# Patient Record
Sex: Male | Born: 1975 | Race: White | Hispanic: No | Marital: Married | Smoking: Current every day smoker
Health system: Southern US, Community
[De-identification: ages and names within clinical notes are randomized; demographics above are authoritative.]

## PROBLEM LIST (undated history)

## (undated) DIAGNOSIS — G4730 Sleep apnea, unspecified: Secondary | ICD-10-CM

## (undated) DIAGNOSIS — I10 Essential (primary) hypertension: Secondary | ICD-10-CM

## (undated) DIAGNOSIS — F419 Anxiety disorder, unspecified: Secondary | ICD-10-CM

## (undated) DIAGNOSIS — G8929 Other chronic pain: Secondary | ICD-10-CM

## (undated) DIAGNOSIS — E785 Hyperlipidemia, unspecified: Secondary | ICD-10-CM

## (undated) DIAGNOSIS — F319 Bipolar disorder, unspecified: Secondary | ICD-10-CM

## (undated) DIAGNOSIS — J449 Chronic obstructive pulmonary disease, unspecified: Secondary | ICD-10-CM

## (undated) DIAGNOSIS — Z86718 Personal history of other venous thrombosis and embolism: Secondary | ICD-10-CM

## (undated) DIAGNOSIS — E119 Type 2 diabetes mellitus without complications: Secondary | ICD-10-CM

## (undated) DIAGNOSIS — Z8614 Personal history of Methicillin resistant Staphylococcus aureus infection: Secondary | ICD-10-CM

## (undated) DIAGNOSIS — F32A Depression, unspecified: Secondary | ICD-10-CM

## (undated) DIAGNOSIS — J9819 Other pulmonary collapse: Secondary | ICD-10-CM

## (undated) DIAGNOSIS — M1990 Unspecified osteoarthritis, unspecified site: Secondary | ICD-10-CM

## (undated) DIAGNOSIS — F329 Major depressive disorder, single episode, unspecified: Secondary | ICD-10-CM

## (undated) DIAGNOSIS — M549 Dorsalgia, unspecified: Secondary | ICD-10-CM

## (undated) HISTORY — DX: Other chronic pain: G89.29

## (undated) HISTORY — PX: VASECTOMY: SHX75

## (undated) HISTORY — DX: Hyperlipidemia, unspecified: E78.5

## (undated) HISTORY — PX: WISDOM TOOTH EXTRACTION: SHX21

## (undated) HISTORY — DX: Anxiety disorder, unspecified: F41.9

## (undated) HISTORY — DX: Dorsalgia, unspecified: M54.9

## (undated) HISTORY — PX: APPENDECTOMY: SHX54

## (undated) HISTORY — PX: DEBRIDEMENT  FOOT: SUR387

## (undated) HISTORY — DX: Major depressive disorder, single episode, unspecified: F32.9

## (undated) HISTORY — DX: Sleep apnea, unspecified: G47.30

## (undated) HISTORY — DX: Bipolar disorder, unspecified: F31.9

## (undated) HISTORY — DX: Depression, unspecified: F32.A

---

## 2001-01-15 ENCOUNTER — Ambulatory Visit (HOSPITAL_COMMUNITY): Admission: RE | Admit: 2001-01-15 | Discharge: 2001-01-15 | Payer: Self-pay | Admitting: Specialist

## 2001-01-15 ENCOUNTER — Encounter: Payer: Self-pay | Admitting: Physical Medicine and Rehabilitation

## 2001-01-15 ENCOUNTER — Encounter: Payer: Self-pay | Admitting: Specialist

## 2001-03-24 ENCOUNTER — Encounter: Payer: Self-pay | Admitting: Specialist

## 2001-03-24 ENCOUNTER — Ambulatory Visit (HOSPITAL_COMMUNITY): Admission: RE | Admit: 2001-03-24 | Discharge: 2001-03-24 | Payer: Self-pay | Admitting: Specialist

## 2002-02-06 ENCOUNTER — Emergency Department (HOSPITAL_COMMUNITY): Admission: EM | Admit: 2002-02-06 | Discharge: 2002-02-06 | Payer: Self-pay | Admitting: Emergency Medicine

## 2002-02-06 ENCOUNTER — Encounter: Payer: Self-pay | Admitting: Emergency Medicine

## 2002-03-03 ENCOUNTER — Ambulatory Visit (HOSPITAL_COMMUNITY): Admission: RE | Admit: 2002-03-03 | Discharge: 2002-03-03 | Payer: Self-pay | Admitting: Family Medicine

## 2002-03-03 ENCOUNTER — Encounter: Payer: Self-pay | Admitting: Family Medicine

## 2002-04-07 ENCOUNTER — Encounter: Payer: Self-pay | Admitting: Emergency Medicine

## 2002-04-07 ENCOUNTER — Inpatient Hospital Stay (HOSPITAL_COMMUNITY): Admission: EM | Admit: 2002-04-07 | Discharge: 2002-04-15 | Payer: Self-pay | Admitting: Emergency Medicine

## 2002-04-08 ENCOUNTER — Encounter: Payer: Self-pay | Admitting: Orthopaedic Surgery

## 2002-04-21 ENCOUNTER — Encounter (HOSPITAL_COMMUNITY): Admission: RE | Admit: 2002-04-21 | Discharge: 2002-05-21 | Payer: Self-pay | Admitting: Orthopaedic Surgery

## 2002-05-27 ENCOUNTER — Encounter (HOSPITAL_COMMUNITY): Admission: RE | Admit: 2002-05-27 | Discharge: 2002-06-26 | Payer: Self-pay | Admitting: Orthopaedic Surgery

## 2002-07-23 ENCOUNTER — Encounter (HOSPITAL_COMMUNITY): Admission: RE | Admit: 2002-07-23 | Discharge: 2002-08-07 | Payer: Self-pay | Admitting: Orthopaedic Surgery

## 2002-08-13 ENCOUNTER — Inpatient Hospital Stay (HOSPITAL_COMMUNITY): Admission: EM | Admit: 2002-08-13 | Discharge: 2002-08-18 | Payer: Self-pay | Admitting: Psychiatry

## 2006-08-16 ENCOUNTER — Emergency Department (HOSPITAL_COMMUNITY): Admission: EM | Admit: 2006-08-16 | Discharge: 2006-08-16 | Payer: Self-pay | Admitting: Emergency Medicine

## 2006-08-23 ENCOUNTER — Emergency Department (HOSPITAL_COMMUNITY): Admission: EM | Admit: 2006-08-23 | Discharge: 2006-08-23 | Payer: Self-pay | Admitting: Emergency Medicine

## 2007-03-07 ENCOUNTER — Emergency Department (HOSPITAL_COMMUNITY): Admission: EM | Admit: 2007-03-07 | Discharge: 2007-03-07 | Payer: Self-pay | Admitting: Emergency Medicine

## 2008-03-12 ENCOUNTER — Encounter: Payer: Self-pay | Admitting: Orthopedic Surgery

## 2008-03-15 ENCOUNTER — Emergency Department (HOSPITAL_COMMUNITY): Admission: EM | Admit: 2008-03-15 | Discharge: 2008-03-15 | Payer: Self-pay | Admitting: Emergency Medicine

## 2008-03-18 ENCOUNTER — Ambulatory Visit: Payer: Self-pay | Admitting: Orthopedic Surgery

## 2008-03-18 DIAGNOSIS — S60229A Contusion of unspecified hand, initial encounter: Secondary | ICD-10-CM | POA: Insufficient documentation

## 2008-04-07 ENCOUNTER — Telehealth: Payer: Self-pay | Admitting: Orthopedic Surgery

## 2008-07-19 ENCOUNTER — Encounter: Payer: Self-pay | Admitting: Orthopedic Surgery

## 2008-07-19 ENCOUNTER — Emergency Department (HOSPITAL_COMMUNITY): Admission: EM | Admit: 2008-07-19 | Discharge: 2008-07-19 | Payer: Self-pay | Admitting: Emergency Medicine

## 2008-07-29 ENCOUNTER — Ambulatory Visit: Payer: Self-pay | Admitting: Orthopedic Surgery

## 2008-07-29 DIAGNOSIS — M542 Cervicalgia: Secondary | ICD-10-CM

## 2008-08-10 ENCOUNTER — Emergency Department (HOSPITAL_COMMUNITY): Admission: EM | Admit: 2008-08-10 | Discharge: 2008-08-10 | Payer: Self-pay | Admitting: Emergency Medicine

## 2009-07-28 ENCOUNTER — Emergency Department (HOSPITAL_COMMUNITY): Admission: EM | Admit: 2009-07-28 | Discharge: 2009-07-28 | Payer: Self-pay | Admitting: Emergency Medicine

## 2010-02-07 ENCOUNTER — Emergency Department (HOSPITAL_COMMUNITY): Admission: EM | Admit: 2010-02-07 | Discharge: 2010-02-07 | Payer: Self-pay | Admitting: Emergency Medicine

## 2010-06-26 ENCOUNTER — Emergency Department (HOSPITAL_COMMUNITY): Admission: EM | Admit: 2010-06-26 | Discharge: 2010-06-26 | Payer: Self-pay | Admitting: Emergency Medicine

## 2010-07-02 ENCOUNTER — Emergency Department (HOSPITAL_COMMUNITY): Admission: EM | Admit: 2010-07-02 | Discharge: 2010-07-02 | Payer: Self-pay | Admitting: Emergency Medicine

## 2010-07-03 ENCOUNTER — Encounter: Payer: Self-pay | Admitting: Emergency Medicine

## 2010-07-03 ENCOUNTER — Emergency Department (HOSPITAL_COMMUNITY): Admission: EM | Admit: 2010-07-03 | Discharge: 2010-07-03 | Payer: Self-pay | Admitting: Emergency Medicine

## 2010-07-03 DIAGNOSIS — Z86718 Personal history of other venous thrombosis and embolism: Secondary | ICD-10-CM

## 2010-07-03 HISTORY — DX: Personal history of other venous thrombosis and embolism: Z86.718

## 2010-07-26 ENCOUNTER — Emergency Department (HOSPITAL_COMMUNITY): Admission: EM | Admit: 2010-07-26 | Discharge: 2010-07-26 | Payer: Self-pay | Admitting: Emergency Medicine

## 2010-08-08 ENCOUNTER — Ambulatory Visit (HOSPITAL_COMMUNITY): Admission: RE | Admit: 2010-08-08 | Discharge: 2010-08-08 | Payer: Self-pay | Admitting: Urology

## 2010-08-24 ENCOUNTER — Ambulatory Visit (HOSPITAL_COMMUNITY): Admission: RE | Admit: 2010-08-24 | Discharge: 2010-08-24 | Payer: Self-pay | Admitting: Urology

## 2011-02-15 LAB — BASIC METABOLIC PANEL
CO2: 29 mEq/L (ref 19–32)
Chloride: 104 mEq/L (ref 96–112)
Creatinine, Ser: 0.71 mg/dL (ref 0.4–1.5)
GFR calc Af Amer: >60 mL/min (ref >60)
Potassium: 3.8 mEq/L (ref 3.5–5.1)

## 2011-02-15 LAB — TYPE AND SCREEN: ABO/RH(D): O NEG

## 2011-02-15 LAB — URINALYSIS, ROUTINE W REFLEX MICROSCOPIC
Bilirubin Urine: NEGATIVE
Hgb urine dipstick: NEGATIVE
Protein, ur: NEGATIVE mg/dL
Urobilinogen, UA: 0.2 mg/dL (ref 0.0–1.0)

## 2011-02-15 LAB — CBC
HCT: 42.8 % (ref 39.0–52.0)
Hemoglobin: 14.2 g/dL (ref 13.0–17.0)
MCH: 30 pg (ref 26.0–34.0)
MCHC: 33.2 g/dL (ref 30.0–36.0)
MCV: 90.5 fL (ref 78.0–100.0)
RBC: 4.73 MIL/uL (ref 4.22–5.81)
WBC: 14.1 10*3/uL — ABNORMAL HIGH (ref 4.0–10.5)

## 2011-02-15 LAB — DIFFERENTIAL
Basophils Relative: 1 % (ref 0–1)
Eosinophils Relative: 4 % (ref 0–5)
Lymphocytes Relative: 25 % (ref 12–46)
Neutro Abs: 9 10*3/uL — ABNORMAL HIGH (ref 1.7–7.7)
Neutrophils Relative %: 64 % (ref 43–77)

## 2011-02-15 LAB — PROTIME-INR
INR: 4.05 — ABNORMAL HIGH (ref 0.00–1.49)
Prothrombin Time: 39.3 seconds — ABNORMAL HIGH (ref 11.6–15.2)

## 2011-02-16 LAB — BASIC METABOLIC PANEL
CO2: 29 mEq/L (ref 19–32)
Calcium: 8.6 mg/dL (ref 8.4–10.5)
Chloride: 105 mEq/L (ref 96–112)
GFR calc Af Amer: >60 mL/min (ref >60)
Potassium: 3.8 mEq/L (ref 3.5–5.1)
Sodium: 138 mEq/L (ref 135–145)

## 2011-02-16 LAB — CBC
Hemoglobin: 13.2 g/dL (ref 13.0–17.0)
MCH: 31.5 pg (ref 26.0–34.0)
WBC: 10.5 10*3/uL (ref 4.0–10.5)

## 2011-02-16 LAB — DIFFERENTIAL
Basophils Relative: 0 % (ref 0–1)
Eosinophils Absolute: 0.6 10*3/uL (ref 0.0–0.7)
Lymphocytes Relative: 28 % (ref 12–46)
Lymphs Abs: 3 10*3/uL (ref 0.7–4.0)
Monocytes Absolute: 0.8 10*3/uL (ref 0.1–1.0)
Neutro Abs: 6.1 10*3/uL (ref 1.7–7.7)
Neutrophils Relative %: 58 % (ref 43–77)

## 2011-02-16 LAB — PROTIME-INR: Prothrombin Time: 13.3 seconds (ref 11.6–15.2)

## 2011-02-17 LAB — CBC
MCHC: 33.5 g/dL (ref 30.0–36.0)
MCV: 92.4 fL (ref 78.0–100.0)
Platelets: 293 10*3/uL (ref 150–400)
RBC: 4.23 MIL/uL (ref 4.22–5.81)
RDW: 14.6 % (ref 11.5–15.5)

## 2011-02-17 LAB — DIFFERENTIAL
Eosinophils Relative: 6 % — ABNORMAL HIGH (ref 0–5)
Lymphocytes Relative: 27 % (ref 12–46)
Lymphs Abs: 2.9 10*3/uL (ref 0.7–4.0)
Monocytes Absolute: 0.8 10*3/uL (ref 0.1–1.0)
Neutrophils Relative %: 60 % (ref 43–77)

## 2011-02-17 LAB — COMPREHENSIVE METABOLIC PANEL
ALT: 53 U/L (ref 0–53)
AST: 39 U/L — ABNORMAL HIGH (ref 0–37)
Albumin: 3.4 g/dL — ABNORMAL LOW (ref 3.5–5.2)
CO2: 28 mEq/L (ref 19–32)
Chloride: 106 mEq/L (ref 96–112)
Total Protein: 6.5 g/dL (ref 6.0–8.3)

## 2011-02-23 LAB — URINALYSIS, ROUTINE W REFLEX MICROSCOPIC
Glucose, UA: NEGATIVE mg/dL
Nitrite: NEGATIVE
Protein, ur: NEGATIVE mg/dL
pH: 6.5 (ref 5.0–8.0)
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
## 2011-05-11 ENCOUNTER — Encounter: Payer: Self-pay | Admitting: Medical

## 2011-05-16 ENCOUNTER — Encounter (HOSPITAL_COMMUNITY): Payer: Self-pay | Admitting: Radiology

## 2011-05-16 ENCOUNTER — Emergency Department (HOSPITAL_COMMUNITY): Payer: Medicare Other

## 2011-05-16 ENCOUNTER — Emergency Department (HOSPITAL_COMMUNITY)
Admission: EM | Admit: 2011-05-16 | Discharge: 2011-05-16 | Disposition: A | Discharge status: Home / Self Care | Payer: Medicare Other | Attending: Emergency Medicine | Admitting: Emergency Medicine

## 2011-05-16 DIAGNOSIS — R319 Hematuria, unspecified: Secondary | ICD-10-CM | POA: Insufficient documentation

## 2011-05-16 DIAGNOSIS — I10 Essential (primary) hypertension: Secondary | ICD-10-CM | POA: Insufficient documentation

## 2011-05-16 DIAGNOSIS — F17200 Nicotine dependence, unspecified, uncomplicated: Secondary | ICD-10-CM | POA: Insufficient documentation

## 2011-05-16 DIAGNOSIS — R000 Tachycardia, unspecified: Secondary | ICD-10-CM | POA: Insufficient documentation

## 2011-05-16 DIAGNOSIS — R109 Unspecified abdominal pain: Secondary | ICD-10-CM | POA: Insufficient documentation

## 2011-05-16 HISTORY — DX: Essential (primary) hypertension: I10

## 2011-05-16 LAB — CBC
MCH: 29.9 pg (ref 26.0–34.0)
MCHC: 33.2 g/dL (ref 30.0–36.0)
MCV: 90 fL (ref 78.0–100.0)
Platelets: 319 10*3/uL (ref 150–400)
RBC: 4.62 MIL/uL (ref 4.22–5.81)
RDW: 12.9 % (ref 11.5–15.5)

## 2011-05-16 LAB — URINALYSIS, ROUTINE W REFLEX MICROSCOPIC
Bilirubin Urine: NEGATIVE
Ketones, ur: NEGATIVE mg/dL
Leukocytes, UA: NEGATIVE
Nitrite: NEGATIVE
Specific Gravity, Urine: 1.01 (ref 1.005–1.030)
Urobilinogen, UA: 0.2 mg/dL (ref 0.0–1.0)
pH: 6 (ref 5.0–8.0)

## 2011-05-16 LAB — DIFFERENTIAL
Basophils Relative: 0 % (ref 0–1)
Eosinophils Absolute: 0.4 10*3/uL (ref 0.0–0.7)
Eosinophils Relative: 4 % (ref 0–5)
Lymphs Abs: 3.6 10*3/uL (ref 0.7–4.0)
Monocytes Absolute: 0.8 10*3/uL (ref 0.1–1.0)
Monocytes Relative: 7 % (ref 3–12)
Neutrophils Relative %: 54 % (ref 43–77)

## 2011-05-16 LAB — BASIC METABOLIC PANEL
CO2: 25 mEq/L (ref 19–32)
Calcium: 9.1 mg/dL (ref 8.4–10.5)
Creatinine, Ser: 0.62 mg/dL (ref 0.4–1.5)
GFR calc Af Amer: >60 mL/min (ref >60)
GFR calc non Af Amer: >60 mL/min (ref >60)
Sodium: 138 mEq/L (ref 135–145)

## 2011-05-18 LAB — URINE CULTURE

## 2011-05-25 ENCOUNTER — Ambulatory Visit (INDEPENDENT_AMBULATORY_CARE_PROVIDER_SITE_OTHER): Payer: Medicare Other | Admitting: Medical

## 2011-05-25 ENCOUNTER — Encounter: Payer: Self-pay | Admitting: Medical

## 2011-05-25 VITALS — BP 110/80 | HR 80 | Temp 97.9°F | Ht 76.0 in | Wt 290.0 lb

## 2011-05-25 DIAGNOSIS — R319 Hematuria, unspecified: Secondary | ICD-10-CM

## 2011-05-25 DIAGNOSIS — M549 Dorsalgia, unspecified: Secondary | ICD-10-CM

## 2011-05-25 DIAGNOSIS — I10 Essential (primary) hypertension: Secondary | ICD-10-CM

## 2011-05-25 DIAGNOSIS — E785 Hyperlipidemia, unspecified: Secondary | ICD-10-CM

## 2011-05-25 DIAGNOSIS — G8929 Other chronic pain: Secondary | ICD-10-CM

## 2011-05-25 LAB — CBC WITH DIFFERENTIAL/PLATELET
Basophils Absolute: 0 10*3/uL (ref 0.0–0.1)
Basophils Relative: 0 % (ref 0–1)
HCT: 41.3 % (ref 39.0–52.0)
Hemoglobin: 13.5 g/dL (ref 13.0–17.0)
Lymphocytes Relative: 26 % (ref 12–46)
MCHC: 32.7 g/dL (ref 30.0–36.0)
Monocytes Relative: 7 % (ref 3–12)
Neutro Abs: 7.2 10*3/uL (ref 1.7–7.7)
Neutrophils Relative %: 64 % (ref 43–77)
RDW: 13.4 % (ref 11.5–15.5)
WBC: 11.4 10*3/uL — ABNORMAL HIGH (ref 4.0–10.5)

## 2011-05-25 LAB — COMPREHENSIVE METABOLIC PANEL
AST: 17 U/L (ref 0–37)
Albumin: 4 g/dL (ref 3.5–5.2)
BUN: 8 mg/dL (ref 6–23)
CO2: 24 mEq/L (ref 19–32)
Calcium: 9.2 mg/dL (ref 8.4–10.5)
Chloride: 109 mEq/L (ref 96–112)
Creat: 0.66 mg/dL (ref 0.50–1.35)
Potassium: 4.3 mEq/L (ref 3.5–5.3)

## 2011-05-25 LAB — POCT URINALYSIS DIPSTICK
Glucose, UA: NEGATIVE
Leukocytes, UA: NEGATIVE
Protein, UA: NEGATIVE
Urobilinogen, UA: NEGATIVE

## 2011-05-25 LAB — POCT GLYCOSYLATED HEMOGLOBIN (HGB A1C): Hemoglobin A1C: 5.5

## 2011-05-25 LAB — LIPID PANEL: Cholesterol: 198 mg/dL (ref 0–200)

## 2011-05-25 MED ORDER — LANCET DEVICE MISC: 100.0000 | Freq: Every day | Status: DC

## 2011-05-25 MED ORDER — GLUCOSE BLOOD VI STRP: ORAL_STRIP | Status: AC

## 2011-05-25 MED ORDER — HYDROCODONE-ACETAMINOPHEN 7.5-500 MG PO TABS: 1.0000 | ORAL_TABLET | Freq: Two times a day (BID) | ORAL | Status: DC

## 2011-05-25 NOTE — Progress Notes (Signed)
Subjective:   HPI  Eric Hayes is a 35 y.o. male who presents as a new patient to establish care.  He was formerly seeing me in [place], [place].  He is here for general recheck on chronic problems including newly diagnosed diabetes in 1/12.  He reports that he cut out starches and potatoes, trying to eat healthier.  He occasionally checks glucose with friend's meter and gets 158-208 for high readings.  Otherwise, numbers have been good.   He went to the ED recently and was treated for UTI.  His presenting symptom was hematuria.  He was supposed to see Dr. Judithann Sheen for pelvic and back pain management already, but there was an issue with copay upfront, and he has rescheduled to 06/15/11.  He needs refill on pain medication.  No other c/o.   The following portions of the patient's history were reviewed and updated as appropriate: allergies, current medications, past family history, past medical history, past social history, past surgical history and problem list.  Past Medical History  Diagnosis Date  . DVT (deep venous thrombosis) 8/11    [facility]  . Sleep apnea 7/11    CPAP  . Depression   . Anxiety   . Back pain, chronic   . Allergy   . Edema   . Diabetes mellitus 1/12  . Hypertension 7/11  . Bipolar disorder     sees Dr. Penelope Galas   Review of Systems Constitutional: denies fever, chills, sweats, unexpected weight change, anorexia, fatigue Dermatology: denies rash Cardiology: denies chest pain, palpitations, edema Respiratory: denies cough, shortness of breath, wheezing Gastroenterology: denies abdominal pain, nausea, vomiting, diarrhea, constipation Ophthalmology: denies vision changes Urology: denies dysuria, difficulty urinating, hematuria, urinary frequency, urgency Neurology: no headache, weakness, tingling, numbness     Objective:   Physical Exam  General appearance: alert, no distress, WD/WN, white male Neck: supple, no lymphadenopathy, no thyromegaly, no masses, no  bruits Heart: RRR, normal S1, S2, no murmurs Lungs: CTA bilaterally, no wheezes, rhonchi, or rales Abdomen: +bs, soft, non tender, non distended, no masses, no hepatomegaly, no splenomegaly Extremities: no edema, no cyanosis, no clubbing Pulses: 2+ symmetric, upper and lower extremities, normal cap refill   Assessment :    Encounter Diagnoses  Name Primary?  . Hyperlipidemia Yes  . Type II or unspecified type diabetes mellitus without mention of complication, uncontrolled   . Chronic back pain   . Hematuria   . Essential hypertension, benign     Plan:   Hyperlipidemia - labs today.  DM type II - diagnosed 1/12 and started on medication at that time. He has been compliant on diet and medication.  HgbA1C improved today.  C/t same meds, try and add in exercise.  Gave starter kit for glucometer and strips.  He will check fasting morning glucose and recheck in 71mo.  Chronic back pain - refilled medication, c/t plan to f/u with Dr. Gerilyn Pilgrim in 7/12.    Hematuria- urine culture sent given recent UTI.  HTN - controlled.

## 2011-05-27 LAB — URINE CULTURE: Colony Count: 1000

## 2011-05-28 ENCOUNTER — Encounter: Payer: Self-pay | Admitting: Medical

## 2011-05-28 ENCOUNTER — Telehealth: Payer: Self-pay | Admitting: *Deleted

## 2011-05-28 NOTE — Telephone Encounter (Signed)
pls call in (and order) Pravastatin 40mg , 1 tablet QHS #30, rf x 5

## 2011-05-28 NOTE — Telephone Encounter (Addendum)
Message copied by Dorthula Perfect on Mon May 28, 2011  4:35 PM ------      Message from: Aleen Campi, DAVID S      Created: Mon May 28, 2011  8:07 AM       See other notes below.  Urine culture was negative      Pt informed of all lab results.   Is agreeable to start medication for cholesterol and would like to be called when medication is called in to pharmacy so that he can pick it up.  CM, LPN

## 2011-05-29 ENCOUNTER — Other Ambulatory Visit: Payer: Self-pay | Admitting: *Deleted

## 2011-05-30 ENCOUNTER — Emergency Department (HOSPITAL_COMMUNITY): Payer: Medicare Other

## 2011-05-30 ENCOUNTER — Emergency Department (HOSPITAL_COMMUNITY)
Admission: EM | Admit: 2011-05-30 | Discharge: 2011-05-30 | Disposition: A | Discharge status: Home / Self Care | Payer: Medicare Other | Attending: Emergency Medicine | Admitting: Emergency Medicine

## 2011-05-30 ENCOUNTER — Other Ambulatory Visit: Payer: Self-pay | Admitting: *Deleted

## 2011-05-30 DIAGNOSIS — E785 Hyperlipidemia, unspecified: Secondary | ICD-10-CM

## 2011-05-30 DIAGNOSIS — X500XXA Overexertion from strenuous movement or load, initial encounter: Secondary | ICD-10-CM | POA: Insufficient documentation

## 2011-05-30 DIAGNOSIS — M7989 Other specified soft tissue disorders: Secondary | ICD-10-CM | POA: Insufficient documentation

## 2011-05-30 DIAGNOSIS — I10 Essential (primary) hypertension: Secondary | ICD-10-CM | POA: Insufficient documentation

## 2011-05-30 DIAGNOSIS — IMO0002 Reserved for concepts with insufficient information to code with codable children: Secondary | ICD-10-CM | POA: Insufficient documentation

## 2011-05-30 DIAGNOSIS — F17200 Nicotine dependence, unspecified, uncomplicated: Secondary | ICD-10-CM | POA: Insufficient documentation

## 2011-05-30 DIAGNOSIS — E119 Type 2 diabetes mellitus without complications: Secondary | ICD-10-CM | POA: Insufficient documentation

## 2011-05-30 LAB — CBC
HCT: 37.8 % — ABNORMAL LOW (ref 39.0–52.0)
Hemoglobin: 12.6 g/dL — ABNORMAL LOW (ref 13.0–17.0)
RBC: 4.18 MIL/uL — ABNORMAL LOW (ref 4.22–5.81)
RDW: 13.2 % (ref 11.5–15.5)
WBC: 9.9 10*3/uL (ref 4.0–10.5)

## 2011-05-30 LAB — DIFFERENTIAL
Basophils Absolute: 0 10*3/uL (ref 0.0–0.1)
Eosinophils Relative: 4 % (ref 0–5)
Lymphocytes Relative: 27 % (ref 12–46)
Neutro Abs: 6.3 10*3/uL (ref 1.7–7.7)
Neutrophils Relative %: 63 % (ref 43–77)

## 2011-05-30 LAB — BASIC METABOLIC PANEL
BUN: 8 mg/dL (ref 6–23)
CO2: 26 mEq/L (ref 19–32)
Chloride: 103 mEq/L (ref 96–112)
GFR calc Af Amer: >60 mL/min (ref >60)
Potassium: 3.6 mEq/L (ref 3.5–5.1)

## 2011-05-30 MED ORDER — PRAVASTATIN SODIUM 40 MG PO TABS: 40.0000 mg | ORAL_TABLET | Freq: Every day | ORAL | Status: DC

## 2011-05-30 NOTE — Telephone Encounter (Signed)
Called in Prevastatin 40 mg 1 po QHS #30 with 5 refills to [facility] at [phone].  Pt aware.  CM, LPN

## 2011-06-20 ENCOUNTER — Other Ambulatory Visit: Payer: Self-pay | Admitting: Medical

## 2011-06-20 MED ORDER — GABAPENTIN 300 MG PO CAPS: 300.0000 mg | ORAL_CAPSULE | Freq: Two times a day (BID) | ORAL | Status: DC

## 2011-06-20 MED ORDER — OMEPRAZOLE 20 MG PO CPDR: 20.0000 mg | DELAYED_RELEASE_CAPSULE | Freq: Every day | ORAL | Status: DC

## 2011-06-20 MED ORDER — CYCLOBENZAPRINE HCL 5 MG PO TABS: 5.0000 mg | ORAL_TABLET | Freq: Two times a day (BID) | ORAL | Status: DC | PRN

## 2011-06-28 ENCOUNTER — Telehealth: Payer: Self-pay | Admitting: Medical

## 2011-06-28 NOTE — Telephone Encounter (Signed)
Pt was sent some test strips through the mail.  Pt stated he didn't turn in test strip rx. To pharmacy.  CM, LPN

## 2011-07-09 ENCOUNTER — Other Ambulatory Visit: Payer: Self-pay | Admitting: Medical

## 2011-07-09 MED ORDER — METOPROLOL TARTRATE 50 MG PO TABS: 50.0000 mg | ORAL_TABLET | Freq: Two times a day (BID) | ORAL | Status: DC

## 2011-07-09 MED ORDER — GLIPIZIDE-METFORMIN HCL 5-500 MG PO TABS: 1.0000 | ORAL_TABLET | Freq: Two times a day (BID) | ORAL | Status: DC

## 2011-08-16 ENCOUNTER — Encounter: Payer: Self-pay | Admitting: Family Medicine

## 2011-08-20 ENCOUNTER — Ambulatory Visit (INDEPENDENT_AMBULATORY_CARE_PROVIDER_SITE_OTHER): Payer: Medicare Other | Admitting: Medical

## 2011-08-20 ENCOUNTER — Encounter: Payer: Self-pay | Admitting: Medical

## 2011-08-20 VITALS — BP 130/90 | HR 78 | Temp 97.7°F | Resp 18 | Ht 74.0 in | Wt 282.0 lb

## 2011-08-20 DIAGNOSIS — M549 Dorsalgia, unspecified: Secondary | ICD-10-CM

## 2011-08-20 DIAGNOSIS — E785 Hyperlipidemia, unspecified: Secondary | ICD-10-CM

## 2011-08-20 DIAGNOSIS — R109 Unspecified abdominal pain: Secondary | ICD-10-CM

## 2011-08-20 DIAGNOSIS — G8929 Other chronic pain: Secondary | ICD-10-CM

## 2011-08-20 DIAGNOSIS — D72829 Elevated white blood cell count, unspecified: Secondary | ICD-10-CM

## 2011-08-20 DIAGNOSIS — Z79899 Other long term (current) drug therapy: Secondary | ICD-10-CM

## 2011-08-20 DIAGNOSIS — E119 Type 2 diabetes mellitus without complications: Secondary | ICD-10-CM

## 2011-08-20 DIAGNOSIS — I10 Essential (primary) hypertension: Secondary | ICD-10-CM

## 2011-08-20 DIAGNOSIS — R319 Hematuria, unspecified: Secondary | ICD-10-CM

## 2011-08-20 DIAGNOSIS — Z23 Encounter for immunization: Secondary | ICD-10-CM

## 2011-08-20 LAB — LIPID PANEL
Cholesterol: 215 mg/dL — ABNORMAL HIGH (ref 0–200)
Total CHOL/HDL Ratio: 4.7 Ratio
Triglycerides: 267 mg/dL — ABNORMAL HIGH (ref <150)
VLDL: 53 mg/dL — ABNORMAL HIGH (ref 0–40)

## 2011-08-20 LAB — CBC
HCT: 44.6 % (ref 39.0–52.0)
Hemoglobin: 14.8 g/dL (ref 13.0–17.0)
RBC: 4.92 MIL/uL (ref 4.22–5.81)

## 2011-08-20 LAB — POCT URINALYSIS DIPSTICK
Bilirubin, UA: NEGATIVE
Blood, UA: NEGATIVE
Glucose, UA: NEGATIVE
Ketones, UA: NEGATIVE
Leukocytes, UA: NEGATIVE
Nitrite, UA: NEGATIVE
Protein, UA: NEGATIVE
Spec Grav, UA: 1.005
Urobilinogen, UA: NEGATIVE
pH, UA: 8

## 2011-08-20 LAB — ALT: ALT: 27 U/L (ref 0–53)

## 2011-08-20 MED ORDER — ACETAMINOPHEN-CODEINE 300-30 MG PO TABS: 1.0000 | ORAL_TABLET | Freq: Two times a day (BID) | ORAL | Status: DC

## 2011-08-20 NOTE — Patient Instructions (Signed)
Recommendations:  See Dentist soon for general checkup and hygiene visit.   See eye doctor at least once yearly for diabetic vision exam.  We updated your flu shot today, and we will call with lab results.   Continue eating healthy, low fat, low carb diabetic diet, exercise regularly.    You did not have blood in the urine today.   Regarding chronic back and pelvic pain - use exercise and stretching to help with the pain.  Stop Lortab.  I wrote for Tylenol #3 today to help with pain as needed.  I don't want you taking this every day.  Continue Neurontin for chronic back pain.    We will try and get copy of prior MRI of your back from [facility].
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
## 2011-08-21 ENCOUNTER — Other Ambulatory Visit: Payer: Self-pay | Admitting: Medical

## 2011-08-21 LAB — HEMOGLOBIN A1C: Hgb A1c MFr Bld: 5.7 % — ABNORMAL HIGH (ref <5.7)

## 2011-08-21 MED ORDER — PRAVASTATIN SODIUM 80 MG PO TABS: 80.0000 mg | ORAL_TABLET | Freq: Every evening | ORAL | Status: DC

## 2011-08-27 ENCOUNTER — Telehealth: Payer: Self-pay | Admitting: Family Medicine

## 2011-08-27 NOTE — Telephone Encounter (Addendum)
Message copied by Janeice Robinson on Mon Aug 27, 2011 10:05 AM ------      Message from: Dorthula Perfect      Created: Thu Aug 23, 2011 12:08 PM                   ----- Message -----         From: Ernst Breach, PA         Sent: 08/21/2011   1:25 PM           To: Deforest Hoyles Morris, CMA            His cholesterol and triglycerides are stool higher than desired.  His diabetes marker is about the same, looks ok.  Otherwise liver test is ok.  His white count has been elevated slightly . We will c/t to watch this.              I want him to continue his current medications, but I want to increase his Pravastatin to 80mg  to help lower cholesterol more.   He needs to be careful with his diet, recommend he stop smoking.             Since he has ongoing pelvic pain and recent blood in urine, I do want him to see Dr. Baldo Ash again.   He has had normal back xray and normal CT abdomen/pelvis within the past year.  Thus, the pain has not resolved and is unexplained.  Pls refer back to Dr. Baldo Ash in [place]. Send copies of labs/OV note.              I want to see him back in either 59mo or depending upon Dr. Michiel Cowboy recommendations.  PATIENT WAS NOTIFIED OF RESULTS AND HE WAS ALSO NOTIFIED OF HIS APPOINTMENT WITH DR. CARL. HIS APPOINTMENT IS October 15TH, 2012 @ 11:15. CLS

## 2011-08-28 ENCOUNTER — Telehealth: Payer: Self-pay | Admitting: *Deleted

## 2011-08-28 NOTE — Telephone Encounter (Signed)
done

## 2011-08-28 NOTE — Telephone Encounter (Signed)
Done

## 2011-08-28 NOTE — Telephone Encounter (Signed)
Spoke with pt and pt stated that ED referred him to Dr. Romeo Apple in [place].  Pt stated he could not make appointment at the time.  Chart and ED notes on your desk for review.  CM, LPN

## 2011-08-28 NOTE — Telephone Encounter (Signed)
Called and spoke with pt regarding an appointment for F/U on knee pain.  Pt will call back and schedule an appointment.  CM, LPN

## 2011-08-28 NOTE — Telephone Encounter (Signed)
Normally if they felt he needed referral, the ED will usually do this.  Does he recall if they made him a referral?  Find out what hospital he went to and try and get copy of the ED note so we can help decide next step if needed.

## 2011-08-28 NOTE — Telephone Encounter (Signed)
The June note suggested acute injury and inflammation.  If this was 20mo, I'd have him check with me first here for knee pain eval.  He may need PT vs ortho referral.

## 2011-08-28 NOTE — Telephone Encounter (Signed)
The note on my desk is from June.  The message suggested that he just went to the ED recently.   Please clarify?

## 2011-08-28 NOTE — Telephone Encounter (Signed)
Pt was seen in June but never did go to the referral from ED.  Pt. Did want the ortho referral in [place].  CM, LPN

## 2011-09-03 ENCOUNTER — Ambulatory Visit (INDEPENDENT_AMBULATORY_CARE_PROVIDER_SITE_OTHER): Payer: Medicare Other | Admitting: Medical

## 2011-09-03 ENCOUNTER — Encounter: Payer: Self-pay | Admitting: Medical

## 2011-09-03 VITALS — BP 122/70 | HR 72 | Temp 97.4°F | Ht 74.0 in | Wt 288.0 lb

## 2011-09-03 DIAGNOSIS — M25561 Pain in right knee: Secondary | ICD-10-CM

## 2011-09-03 DIAGNOSIS — M25569 Pain in unspecified knee: Secondary | ICD-10-CM

## 2011-09-03 NOTE — Progress Notes (Signed)
Subjective:    Eric Hayes is a 35 y.o. male who presents with knee swelling involving the right knee. Onset was gradual, starting about 2 months ago. Inciting event: none known. Current symptoms include: pain located right knee , swelling and grinding sensation at times. Pain is aggravated by walking and going up staird, being on the leg for prolonged period. Patient has had no prior knee problems. Evaluation to date: was seen at [facility] ED 2 mo for the same, had normal xray.. Treatment to date: brace which is somewhat effective, ice and prescription NSAIDS which are ineffective.  Denies hx/o leg or knee surgery.  He has been walking for exercise, but this is limited due to the right knee pain.  The pain is daily, but the swelling is intermittent. No prior ortho eval for this.  He notes that the Emergency Dept referred him to ortho/Dr. Romeo Apple, but the referral expired before he could get a ride to ortho.  The following portions of the patient's history were reviewed and updated as appropriate: allergies, current medications, past family history, past medical history, past social history, past surgical history and problem list.   Past Medical History  Diagnosis Date  . DVT (deep venous thrombosis) 8/11    [facility]  . Sleep apnea 7/11    CPAP  . Depression   . Anxiety   . Back pain, chronic   . Allergy   . Edema   . Diabetes mellitus 1/12  . Hypertension 7/11  . Bipolar disorder     sees Dr. Penelope Galas    Review of Systems Gen: no fever, chills, sweats, wt change Skin: no rash Neuro: no numbness, tingling, weakness Back: chronic pain MSK: right knee pain, intermittent swelling, otherwise negative.   Objective:    BP 122/70  Pulse 72  Temp(Src) 97.4 F (36.3 C) (Oral)  Ht 6\' 2"  (1.88 m)  Wt 288 lb (130.636 kg)  BMI 36.98 kg/m2 Right knee: positive exam findings: lateral joint line tenderness and mild pain wiht flexoin, mild pain with McMurray's  otherwise no laxity otherwise normal rihgt knee exam, no obviosu swelling today and negative exam findings: no effusion, no erythema, ACL stable, PCL stable, MCL stable and LCL stable  Left knee:  normal and no effusion, full active range of motion, no joint line tenderness, ligamentous structures intact.  MSK: bilat hips with decreased internal ROM, otherwise rest of LE unremarkable   Assessment:      Encounter Diagnosis  Name Primary?  . Right knee pain Yes    Plan:    Xray today with slight osteophyte of posterior tibia on lateral view, but otherwise normal joint space, no other acute changes.  Reviewed ED note from 05/30/11.  At that time knee xray and right LE doppler US were both negative.    Advised that symptoms and exam suggest patellofemoral syndrome vs possible mild meniscal problem.   Will refer to physical therapy.  Advised ice, elevation, rest, and OTC NSAIDs for pain/swelling as needed.    Recheck in 43mo.

## 2011-09-18 ENCOUNTER — Telehealth: Payer: Self-pay | Admitting: Family Medicine

## 2011-09-18 ENCOUNTER — Other Ambulatory Visit: Payer: Self-pay | Admitting: Medical

## 2011-09-18 ENCOUNTER — Telehealth: Payer: Self-pay | Admitting: *Deleted

## 2011-09-18 MED ORDER — ACETAMINOPHEN-CODEINE 300-30 MG PO TABS: 1.0000 | ORAL_TABLET | Freq: Two times a day (BID) | ORAL | Status: DC

## 2011-09-18 MED ORDER — GABAPENTIN 300 MG PO CAPS: 300.0000 mg | ORAL_CAPSULE | Freq: Two times a day (BID) | ORAL | Status: DC

## 2011-09-18 MED ORDER — CYCLOBENZAPRINE HCL 5 MG PO TABS: 5.0000 mg | ORAL_TABLET | Freq: Two times a day (BID) | ORAL | Status: DC | PRN

## 2011-09-18 NOTE — Progress Notes (Signed)
Patients medication was called out to the pharmacy. CLS

## 2011-09-18 NOTE — Telephone Encounter (Signed)
Patient said that his pharmacy will send over some rx refill request as well. CLS

## 2011-09-19 ENCOUNTER — Ambulatory Visit (HOSPITAL_COMMUNITY): Payer: Medicare Other | Admitting: Physical Therapy

## 2011-09-25 NOTE — Telephone Encounter (Signed)
done

## 2011-10-18 ENCOUNTER — Telehealth: Payer: Self-pay | Admitting: Medical

## 2011-10-18 ENCOUNTER — Other Ambulatory Visit: Payer: Self-pay | Admitting: Medical

## 2011-10-18 MED ORDER — ACETAMINOPHEN-CODEINE 300-30 MG PO TABS: 1.0000 | ORAL_TABLET | Freq: Two times a day (BID) | ORAL | Status: DC

## 2011-10-18 NOTE — Telephone Encounter (Signed)
Did you get my other msg about this refill?

## 2011-10-19 NOTE — Progress Notes (Signed)
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 Eric Hayes. CLS

## 2011-10-19 NOTE — Telephone Encounter (Signed)
Per Vincenza Hews ok to refill Tylenol #3 #60 with one refill.  Called [facility] and gave over phone [phone]

## 2011-12-04 DIAGNOSIS — Z8614 Personal history of Methicillin resistant Staphylococcus aureus infection: Secondary | ICD-10-CM

## 2011-12-04 HISTORY — DX: Personal history of Methicillin resistant Staphylococcus aureus infection: Z86.14

## 2011-12-17 ENCOUNTER — Other Ambulatory Visit: Payer: Self-pay | Admitting: Medical

## 2011-12-17 ENCOUNTER — Telehealth: Payer: Self-pay | Admitting: Medical

## 2011-12-17 MED ORDER — CYCLOBENZAPRINE HCL 5 MG PO TABS: 5.0000 mg | ORAL_TABLET | Freq: Two times a day (BID) | ORAL | Status: DC | PRN

## 2011-12-17 MED ORDER — OMEPRAZOLE 20 MG PO CPDR: 20.0000 mg | DELAYED_RELEASE_CAPSULE | Freq: Every day | ORAL | Status: DC

## 2011-12-17 MED ORDER — GABAPENTIN 300 MG PO CAPS: 300.0000 mg | ORAL_CAPSULE | Freq: Two times a day (BID) | ORAL | Status: DC

## 2011-12-17 MED ORDER — ACETAMINOPHEN-CODEINE 300-30 MG PO TABS: 1.0000 | ORAL_TABLET | Freq: Two times a day (BID) | ORAL | Status: DC

## 2011-12-18 NOTE — Progress Notes (Signed)
I called tynelol to the pharmacy per [facility]. Patient was also notiifed. CLS

## 2011-12-19 NOTE — Telephone Encounter (Signed)
DONE

## 2012-02-06 ENCOUNTER — Ambulatory Visit (INDEPENDENT_AMBULATORY_CARE_PROVIDER_SITE_OTHER): Payer: Medicare Other | Admitting: Medical

## 2012-02-06 ENCOUNTER — Encounter: Payer: Self-pay | Admitting: Medical

## 2012-02-06 VITALS — BP 112/80 | HR 88 | Temp 97.6°F | Resp 18 | Wt 280.0 lb

## 2012-02-06 DIAGNOSIS — E119 Type 2 diabetes mellitus without complications: Secondary | ICD-10-CM

## 2012-02-06 DIAGNOSIS — I10 Essential (primary) hypertension: Secondary | ICD-10-CM | POA: Diagnosis not present

## 2012-02-06 DIAGNOSIS — F17200 Nicotine dependence, unspecified, uncomplicated: Secondary | ICD-10-CM | POA: Insufficient documentation

## 2012-02-06 DIAGNOSIS — R3129 Other microscopic hematuria: Secondary | ICD-10-CM | POA: Diagnosis not present

## 2012-02-06 DIAGNOSIS — G8929 Other chronic pain: Secondary | ICD-10-CM

## 2012-02-06 DIAGNOSIS — M549 Dorsalgia, unspecified: Secondary | ICD-10-CM | POA: Diagnosis not present

## 2012-02-06 DIAGNOSIS — E785 Hyperlipidemia, unspecified: Secondary | ICD-10-CM | POA: Diagnosis not present

## 2012-02-06 DIAGNOSIS — Z23 Encounter for immunization: Secondary | ICD-10-CM

## 2012-02-06 DIAGNOSIS — R102 Pelvic and perineal pain: Secondary | ICD-10-CM | POA: Insufficient documentation

## 2012-02-06 LAB — POCT URINALYSIS DIPSTICK
Ketones, UA: NEGATIVE
Leukocytes, UA: NEGATIVE
Protein, UA: NEGATIVE

## 2012-02-06 LAB — LIPID PANEL: Total CHOL/HDL Ratio: 5.1 Ratio

## 2012-02-06 LAB — COMPREHENSIVE METABOLIC PANEL
Albumin: 4.1 g/dL (ref 3.5–5.2)
CO2: 23 mEq/L (ref 19–32)
Calcium: 8.9 mg/dL (ref 8.4–10.5)
Chloride: 110 mEq/L (ref 96–112)
Glucose, Bld: 100 mg/dL — ABNORMAL HIGH (ref 70–99)
Potassium: 3.9 mEq/L (ref 3.5–5.3)
Sodium: 140 mEq/L (ref 135–145)
Total Protein: 6.8 g/dL (ref 6.0–8.3)

## 2012-02-06 LAB — HEMOGLOBIN A1C
Hgb A1c MFr Bld: 5.7 % — ABNORMAL HIGH (ref <5.7)
Mean Plasma Glucose: 117 mg/dL — ABNORMAL HIGH (ref <117)

## 2012-02-06 MED ORDER — ACETAMINOPHEN-CODEINE 300-30 MG PO TABS: 1.0000 | ORAL_TABLET | Freq: Two times a day (BID) | ORAL | Status: DC

## 2012-02-06 MED ORDER — METOPROLOL TARTRATE 50 MG PO TABS: 50.0000 mg | ORAL_TABLET | Freq: Two times a day (BID) | ORAL | Status: DC

## 2012-02-06 MED ORDER — GABAPENTIN 300 MG PO CAPS: 300.0000 mg | ORAL_CAPSULE | Freq: Two times a day (BID) | ORAL | Status: DC

## 2012-02-06 MED ORDER — OMEPRAZOLE 20 MG PO CPDR: 20.0000 mg | DELAYED_RELEASE_CAPSULE | Freq: Every day | ORAL | Status: DC

## 2012-02-06 MED ORDER — GLIPIZIDE-METFORMIN HCL 5-500 MG PO TABS: 1.0000 | ORAL_TABLET | Freq: Two times a day (BID) | ORAL | Status: DC

## 2012-02-06 MED ORDER — CYCLOBENZAPRINE HCL 5 MG PO TABS: 5.0000 mg | ORAL_TABLET | Freq: Two times a day (BID) | ORAL | Status: DC | PRN

## 2012-02-06 NOTE — Progress Notes (Signed)
Subjective:   HPI  Eric Hayes is a 36 y.o. male who presents for general recheck.  Here for recheck on diabetes, lipids, blood pressure, chronic back pain, hematuria, chronic pelvic pain.   He still c/o ongoing back pain, ongoing lower belly pain.  He has had CT abdomen/pelvis within the past year negative.  Was going to go back and see Urology, but urologist in [place] closed his practice.  He c/t to get lower belly pain but no testicle pain, no penile pain, no visible blood in urine or urinary symptoms.  He says he is compliant with all his medications.  No other new c/o.  No other aggravating or relieving factors.    No other c/o.  The following portions of the patient's history were reviewed and updated as appropriate: allergies, current medications, past family history, past medical history, past social history, past surgical history and problem list.  Past Medical History  Diagnosis Date  . DVT (deep venous thrombosis) 8/11    [facility]  . Sleep apnea 7/11    CPAP  . Depression   . Anxiety   . Back pain, chronic   . Allergy   . Edema   . Diabetes mellitus 1/12  . Hypertension 7/11  . Bipolar disorder     sees Dr. Penelope Galas    Allergies  Allergen Reactions  . Penicillins    Review of Systems Gen: no fever, chills, wt changes Skin: rash at belt line Heart: negative Lungs: negative GI: negative GU: LLQ pain, but no other GU c/o Back: chronic low back pain Neuro: some tingling and numbness in feet ROS reviewed and was negative other than noted in HPI or above.    Objective:   Physical Exam  General appearance: alert, no distress, WD/WN, overweight Oral cavity: MMM, no lesions Neck: supple, no lymphadenopathy, no thyromegaly, no masses Heart: RRR, normal S1, S2, no murmurs Lungs: few wheezes, rhonchi, or rales Abdomen: +bs, soft, mild LLQ tenderness, otherwise nontender, stria on abdomen, non distended, no masses, no hepatomegaly, no splenomegaly Back:  paraspinal lumbar tenderness, but no obvious scoliosis or midline tenderness Ext: no calve pain, no edema Pulses: 2+ symmetric, upper and lower extremities, normal cap refill Neuro: normal LE strength and sensation, DTRs normal, normal heel and toe walk  Assessment and Plan :     Encounter Diagnoses  Name Primary?  . Type II or unspecified type diabetes mellitus without mention of complication, not stated as uncontrolled Yes  . Essential hypertension, benign   . Hyperlipidemia   . Chronic back pain   . Pelvic pain   . Microscopic hematuria   . Need for pneumococcal vaccination   . Tobacco use disorder    Last visit A1C was at goal, BP was at goal, but lipids were not.  Labs today, refills on all medications today, referral to ortho for back pain and pelvic pain that I feel is likely radicular issues in lumbar spine given normal CT abd/pelvis and prior negative urology workup in the past year.   VIS, vaccine counseling, and pneumococcal vaccine update today.  Discussed need to stop tobacco.  Advised he call his CPAP supplier to adjust his settings.  We will call with lab results and plan.

## 2012-02-07 ENCOUNTER — Other Ambulatory Visit: Payer: Self-pay | Admitting: Medical

## 2012-02-07 LAB — MICROALBUMIN / CREATININE URINE RATIO: Microalb, Ur: 0.8 mg/dL (ref 0.00–1.89)

## 2012-02-07 MED ORDER — ATORVASTATIN CALCIUM 80 MG PO TABS: 80.0000 mg | ORAL_TABLET | Freq: Every day | ORAL | Status: DC

## 2012-03-04 DIAGNOSIS — Z79899 Other long term (current) drug therapy: Secondary | ICD-10-CM | POA: Diagnosis not present

## 2012-04-22 ENCOUNTER — Encounter (HOSPITAL_COMMUNITY): Payer: Self-pay

## 2012-04-22 ENCOUNTER — Emergency Department (HOSPITAL_COMMUNITY)
Admission: EM | Admit: 2012-04-22 | Discharge: 2012-04-22 | Disposition: A | Discharge status: Home / Self Care | Payer: Medicare Other | Attending: Emergency Medicine | Admitting: Emergency Medicine

## 2012-04-22 DIAGNOSIS — F17200 Nicotine dependence, unspecified, uncomplicated: Secondary | ICD-10-CM | POA: Diagnosis not present

## 2012-04-22 DIAGNOSIS — L0231 Cutaneous abscess of buttock: Secondary | ICD-10-CM | POA: Diagnosis not present

## 2012-04-22 DIAGNOSIS — Z4800 Encounter for change or removal of nonsurgical wound dressing: Secondary | ICD-10-CM | POA: Diagnosis not present

## 2012-04-22 DIAGNOSIS — Z79899 Other long term (current) drug therapy: Secondary | ICD-10-CM | POA: Diagnosis not present

## 2012-04-22 DIAGNOSIS — L0291 Cutaneous abscess, unspecified: Secondary | ICD-10-CM

## 2012-04-22 MED ORDER — HYDROCODONE-ACETAMINOPHEN 5-325 MG PO TABS: 1.0000 | ORAL_TABLET | Freq: Four times a day (QID) | ORAL | Status: AC | PRN

## 2012-04-22 MED ORDER — DOXYCYCLINE HYCLATE 100 MG PO CAPS: 100.0000 mg | ORAL_CAPSULE | Freq: Two times a day (BID) | ORAL | Status: AC

## 2012-04-22 MED ORDER — HYDROCODONE-ACETAMINOPHEN 5-325 MG PO TABS
1.0000 | ORAL_TABLET | Freq: Once | ORAL | Status: AC
  Administered 2012-04-22: 1 via ORAL
  Filled 2012-04-22: qty 1

## 2012-04-22 MED ORDER — DOXYCYCLINE HYCLATE 100 MG PO TABS
100.0000 mg | ORAL_TABLET | Freq: Once | ORAL | Status: AC
Start: 2012-04-22 — End: 2012-04-22
  Administered 2012-04-22: 100 mg via ORAL
  Filled 2012-04-22: qty 1

## 2012-04-22 NOTE — ED Notes (Signed)
?  abcess to buttock area for 3 days.  Not draining, increasing in pain

## 2012-04-22 NOTE — ED Notes (Signed)
Pt has abscess to lt buttock. With mult other areas of red skin lesions.

## 2012-04-22 NOTE — ED Provider Notes (Signed)
Medical screening examination/treatment/procedure(s) were performed by non-physician practitioner and as supervising physician I was immediately available for consultation/collaboration.   Joya Gaskins, MD 04/22/12 [phone]

## 2012-04-22 NOTE — Discharge Instructions (Signed)
Abscess An abscess (boil or furuncle) is an infected area that contains a collection of pus.  SYMPTOMS Signs and symptoms of an abscess include pain, tenderness, redness, or hardness. You may feel a moveable soft area under your skin. An abscess can occur anywhere in the body.  TREATMENT  A surgical cut (incision) may be made over your abscess to drain the pus. Gauze may be packed into the space or a drain may be looped through the abscess cavity (pocket). This provides a drain that will allow the cavity to heal from the inside outwards. The abscess may be painful for a few days, but should feel much better if it was drained.  Your abscess, if seen early, may not have localized and may not have been drained. If not, another appointment may be required if it does not get better on its own or with medications. HOME CARE INSTRUCTIONS   Only take over-the-counter or prescription medicines for pain, discomfort, or fever as directed by your caregiver.   Take your antibiotics as directed if they were prescribed. Finish them even if you start to feel better.   Keep the skin and clothes clean around your abscess.   If the abscess was drained, you will need to use gauze dressing to collect any draining pus. Dressings will typically need to be changed 3 or more times a day.   The infection may spread by skin contact with others. Avoid skin contact as much as possible.   Practice good hygiene. This includes regular hand washing, cover any draining skin lesions, and do not share personal care items.   If you participate in sports, do not share athletic equipment, towels, whirlpools, or personal care items. Shower after every practice or tournament.   If a draining area cannot be adequately covered:   Do not participate in sports.   Children should not participate in day care until the wound has healed or drainage stops.   If your caregiver has given you a follow-up appointment, it is very important  to keep that appointment. Not keeping the appointment could result in a much worse infection, chronic or permanent injury, pain, and disability. If there is any problem keeping the appointment, you must call back to this facility for assistance.  SEEK MEDICAL CARE IF:   You develop increased pain, swelling, redness, drainage, or bleeding in the wound site.   You develop signs of generalized infection including muscle aches, chills, fever, or a general ill feeling.   You have an oral temperature above 102 F (38.9 C).  MAKE SURE YOU:   Understand these instructions.   Will watch your condition.   Will get help right away if you are not doing well or get worse.  Document Released: 08/29/2005 Document Revised: 11/08/2011 Document Reviewed: 06/22/2008 [facility] Patient Information 2012 [place], [place].   Take the meds as directed.  Take ibuprofen 800 mg every 8 hrs with food.  Apply warm compresses several times daily.  Return if the area become soft and fluid-filled in the center and we will incise and drain it.
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
## 2012-05-26 DIAGNOSIS — F319 Bipolar disorder, unspecified: Secondary | ICD-10-CM | POA: Diagnosis not present

## 2012-05-27 ENCOUNTER — Encounter (HOSPITAL_COMMUNITY): Payer: Self-pay | Admitting: *Deleted

## 2012-05-27 ENCOUNTER — Emergency Department (HOSPITAL_COMMUNITY)
Admission: EM | Admit: 2012-05-27 | Discharge: 2012-05-27 | Disposition: A | Discharge status: Home / Self Care | Payer: Medicare Other | Attending: Emergency Medicine | Admitting: Emergency Medicine

## 2012-05-27 DIAGNOSIS — M722 Plantar fascial fibromatosis: Secondary | ICD-10-CM

## 2012-05-27 DIAGNOSIS — Z86718 Personal history of other venous thrombosis and embolism: Secondary | ICD-10-CM | POA: Insufficient documentation

## 2012-05-27 DIAGNOSIS — F319 Bipolar disorder, unspecified: Secondary | ICD-10-CM | POA: Diagnosis not present

## 2012-05-27 DIAGNOSIS — M79609 Pain in unspecified limb: Secondary | ICD-10-CM | POA: Insufficient documentation

## 2012-05-27 DIAGNOSIS — F17200 Nicotine dependence, unspecified, uncomplicated: Secondary | ICD-10-CM | POA: Insufficient documentation

## 2012-05-27 DIAGNOSIS — I10 Essential (primary) hypertension: Secondary | ICD-10-CM | POA: Diagnosis not present

## 2012-05-27 DIAGNOSIS — G4730 Sleep apnea, unspecified: Secondary | ICD-10-CM | POA: Diagnosis not present

## 2012-05-27 DIAGNOSIS — G8929 Other chronic pain: Secondary | ICD-10-CM | POA: Diagnosis not present

## 2012-05-27 DIAGNOSIS — E119 Type 2 diabetes mellitus without complications: Secondary | ICD-10-CM | POA: Diagnosis not present

## 2012-05-27 MED ORDER — DEXAMETHASONE SODIUM PHOSPHATE 4 MG/ML IJ SOLN
8.0000 mg | Freq: Once | INTRAMUSCULAR | Status: AC
  Administered 2012-05-27: 8 mg via INTRAMUSCULAR
  Filled 2012-05-27: qty 2

## 2012-05-27 MED ORDER — HYDROCODONE-ACETAMINOPHEN 5-325 MG PO TABS
2.0000 | ORAL_TABLET | Freq: Once | ORAL | Status: AC
  Administered 2012-05-27: 2 via ORAL
  Filled 2012-05-27: qty 2

## 2012-05-27 MED ORDER — HYDROCODONE-ACETAMINOPHEN 7.5-325 MG PO TABS: 1.0000 | ORAL_TABLET | ORAL | Status: AC | PRN

## 2012-05-27 MED ORDER — MELOXICAM 7.5 MG PO TABS: ORAL_TABLET | ORAL | Status: DC

## 2012-05-27 MED ORDER — ONDANSETRON HCL 4 MG PO TABS
4.0000 mg | ORAL_TABLET | Freq: Once | ORAL | Status: AC
  Administered 2012-05-27: 4 mg via ORAL
  Filled 2012-05-27: qty 1

## 2012-05-27 NOTE — ED Notes (Signed)
Pt states that he woke up this am with pain to right foot, pain is located at right great toe joint and radiates upward toward the ankle, denies any injury

## 2012-05-27 NOTE — Discharge Instructions (Signed)
Please see Dr Ulice Brilliant or the foot specialist of your choice for assistance with this problem. Warm salt water soaks daily. Mobic 2 times daily with food. Norco may cause drowsiness, use with caution.Plantar Fasciitis Plantar fasciitis is a common condition that causes foot pain. It is soreness (inflammation) of the band of tough fibrous tissue on the bottom of the foot that runs from the heel bone (calcaneus) to the ball of the foot. The cause of this soreness may be from excessive standing, poor fitting shoes, running on hard surfaces, being overweight, having an abnormal walk, or overuse (this is common in runners) of the painful foot or feet. It is also common in aerobic exercise dancers and ballet dancers. SYMPTOMS  Most people with plantar fasciitis complain of:  Severe pain in the morning on the bottom of their foot especially when taking the first steps out of bed. This pain recedes after a few minutes of walking.   Severe pain is experienced also during walking following a long period of inactivity.   Pain is worse when walking barefoot or up stairs  DIAGNOSIS   Your caregiver will diagnose this condition by examining and feeling your foot.   Special tests such as X-rays of your foot, are usually not needed.  PREVENTION   Consult a sports medicine professional before beginning a new exercise program.   Walking programs offer a good workout. With walking there is a lower chance of overuse injuries common to runners. There is less impact and less jarring of the joints.   Begin all new exercise programs slowly. If problems or pain develop, decrease the amount of time or distance until you are at a comfortable level.   Wear good shoes and replace them regularly.   Stretch your foot and the heel cords at the back of the ankle (Achilles tendon) both before and after exercise.   Run or exercise on even surfaces that are not hard. For example, asphalt is better than pavement.   Do not run  barefoot on hard surfaces.   If using a treadmill, vary the incline.   Do not continue to workout if you have foot or joint problems. Seek professional help if they do not improve.  HOME CARE INSTRUCTIONS   Avoid activities that cause you pain until you recover.   Use ice or cold packs on the problem or painful areas after working out.   Only take over-the-counter or prescription medicines for pain, discomfort, or fever as directed by your caregiver.   Soft shoe inserts or athletic shoes with air or gel sole cushions may be helpful.   If problems continue or become more severe, consult a sports medicine caregiver or your own health care provider. Cortisone is a potent anti-inflammatory medication that may be injected into the painful area. You can discuss this treatment with your caregiver.  MAKE SURE YOU:   Understand these instructions.   Will watch your condition.   Will get help right away if you are not doing well or get worse.  Document Released: 08/14/2001 Document Revised: 11/08/2011 Document Reviewed: 10/13/2008 [facility] Patient Information 2012 [place], [place].

## 2012-05-27 NOTE — ED Provider Notes (Signed)
Medical screening examination/treatment/procedure(s) were performed by non-physician practitioner and as supervising physician I was immediately available for consultation/collaboration.  Donnetta Hutching, MD 05/27/12 1736
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
## 2012-07-29 ENCOUNTER — Encounter: Payer: Self-pay | Admitting: Internal Medicine

## 2012-07-30 ENCOUNTER — Encounter: Payer: Self-pay | Admitting: Medical

## 2012-07-30 ENCOUNTER — Ambulatory Visit (INDEPENDENT_AMBULATORY_CARE_PROVIDER_SITE_OTHER): Payer: Medicare Other | Admitting: Medical

## 2012-07-30 ENCOUNTER — Ambulatory Visit
Admission: RE | Admit: 2012-07-30 | Discharge: 2012-07-30 | Disposition: A | Discharge status: Home / Self Care | Payer: Medicare Other | Source: Ambulatory Visit | Attending: Medical | Admitting: Medical

## 2012-07-30 VITALS — BP 108/80 | HR 64 | Temp 97.6°F | Resp 16 | Wt 263.0 lb

## 2012-07-30 DIAGNOSIS — R630 Anorexia: Secondary | ICD-10-CM | POA: Diagnosis not present

## 2012-07-30 DIAGNOSIS — K029 Dental caries, unspecified: Secondary | ICD-10-CM

## 2012-07-30 DIAGNOSIS — F17200 Nicotine dependence, unspecified, uncomplicated: Secondary | ICD-10-CM

## 2012-07-30 DIAGNOSIS — G8929 Other chronic pain: Secondary | ICD-10-CM

## 2012-07-30 DIAGNOSIS — R634 Abnormal weight loss: Secondary | ICD-10-CM

## 2012-07-30 DIAGNOSIS — I10 Essential (primary) hypertension: Secondary | ICD-10-CM

## 2012-07-30 DIAGNOSIS — E785 Hyperlipidemia, unspecified: Secondary | ICD-10-CM | POA: Diagnosis not present

## 2012-07-30 DIAGNOSIS — R062 Wheezing: Secondary | ICD-10-CM

## 2012-07-30 DIAGNOSIS — M549 Dorsalgia, unspecified: Secondary | ICD-10-CM

## 2012-07-30 DIAGNOSIS — E119 Type 2 diabetes mellitus without complications: Secondary | ICD-10-CM

## 2012-07-30 DIAGNOSIS — R05 Cough: Secondary | ICD-10-CM | POA: Diagnosis not present

## 2012-07-30 LAB — POCT URINALYSIS DIPSTICK
Blood, UA: NEGATIVE
Glucose, UA: NEGATIVE
Nitrite, UA: NEGATIVE
Urobilinogen, UA: NEGATIVE

## 2012-07-30 LAB — CBC WITH DIFFERENTIAL/PLATELET
Basophils Absolute: 0 10*3/uL (ref 0.0–0.1)
Eosinophils Relative: 6 % — ABNORMAL HIGH (ref 0–5)
Lymphocytes Relative: 21 % (ref 12–46)
MCV: 88.4 fL (ref 78.0–100.0)
Neutro Abs: 8.2 10*3/uL — ABNORMAL HIGH (ref 1.7–7.7)
Neutrophils Relative %: 66 % (ref 43–77)
Platelets: 375 10*3/uL (ref 150–400)
RDW: 15.1 % (ref 11.5–15.5)
WBC: 12.4 10*3/uL — ABNORMAL HIGH (ref 4.0–10.5)

## 2012-07-30 LAB — COMPREHENSIVE METABOLIC PANEL
ALT: 16 U/L (ref 0–53)
AST: 12 U/L (ref 0–37)
Albumin: 3.9 g/dL (ref 3.5–5.2)
Alkaline Phosphatase: 81 U/L (ref 39–117)
Calcium: 8.9 mg/dL (ref 8.4–10.5)
Chloride: 112 mEq/L (ref 96–112)
Potassium: 4.1 mEq/L (ref 3.5–5.3)
Sodium: 142 mEq/L (ref 135–145)
Total Protein: 6.7 g/dL (ref 6.0–8.3)

## 2012-07-30 LAB — HEMOGLOBIN A1C
Hgb A1c MFr Bld: 5.7 % — ABNORMAL HIGH (ref <5.7)
Mean Plasma Glucose: 117 mg/dL — ABNORMAL HIGH (ref <117)

## 2012-07-30 LAB — TSH: TSH: 1.063 u[IU]/mL (ref 0.350–4.500)

## 2012-07-30 LAB — LIPID PANEL: LDL Cholesterol: 127 mg/dL — ABNORMAL HIGH (ref 0–99)

## 2012-07-30 MED ORDER — NICOTINE POLACRILEX 4 MG MT GUM: 4.0000 mg | CHEWING_GUM | OROMUCOSAL | Status: AC | PRN

## 2012-07-30 MED ORDER — ACETAMINOPHEN-CODEINE #3 300-30 MG PO TABS: 1.0000 | ORAL_TABLET | ORAL | Status: AC | PRN

## 2012-07-30 NOTE — Addendum Note (Signed)
Addended by: Janeice Robinson on: 07/30/2012 03:47 PM   Modules accepted: Orders

## 2012-07-30 NOTE — Progress Notes (Signed)
Subjective: Here for recheck.   He notes for weeks now he hasn't been feeling good.  Lately he reports nausea, no appetite, weight loss, sweats, chills.  He has chronic back pain, and at last check was to see orthopedics, but then his grandmother passed away.  It was hard to get over this, but he never went to orthopedics referral at that time.  He does request something for pain.  He ended up quitting all of his medication for pain (mobic, neurontin, flexeril) and lately has used Tylenol only but he stays in pain.  He still sees psychiatry, Dr. Penelope Hayes, compliant with Risperidone.  He does note mood sometimes good, sometimes sucks.  He got married to his long time girlfriend in August.  He continues to walk daily 1.5-2 miles daily.  He notes that he hasn't been specifically trying to lose weight.  He has wanted to lose weight, but says this is happening fast, clothes not fitting correctly.   He smokes still, but wants medication today to help stop tobacco. He has been checking glucose fairly regularly, and on at least 4 occasions recently has gotten glucose in the 30s.  He does not check his BP at home.    Past Medical History  Diagnosis Date  . DVT (deep venous thrombosis) 8/11    [facility]  . Sleep apnea 7/11    CPAP  . Depression   . Anxiety   . Back pain, chronic   . Allergy   . Edema   . Diabetes mellitus 1/12  . Hypertension 7/11  . Bipolar disorder     sees Dr. Penelope Hayes   Review of Systems Constitutional: -fever,  +unexpected -weight change,-fatigue ENT: -runny nose, -ear pain, -sore throat Cardiology:  -chest pain, -palpitations, -edema Respiratory: -cough, -shortness of breath, +wheezing Gastroenterology: +lower abdominal pain, +nausea, -vomiting, -diarrhea, -constipation  Ophthalmology: blurred at times Urology: -dysuria, -difficulty urinating, -hematuria, -urinary frequency, -urgency Neurology: -headache, -weakness, -tingling, -numbness     Objective:   Physical  Exam  Filed Vitals:   07/30/12 1117  BP: 108/80  Pulse: 64  Temp: 97.6 F (36.4 C)  Resp: 16    General appearance: alert, no distress, WD/WN, obvious weight loss since last visit Oral cavity: MMM, moderate plaque, some caries noted Neck: supple, no lymphadenopathy, no thyromegaly, no masses Heart: RRR, normal S1, S2, no murmurs Lungs: decreased breath sounds, but no wheezes, rhonchi, or rales Abdomen: +bs, soft, non tender, non distended, no masses, no hepatomegaly, no splenomegaly Pulses: 2+ symmetric, upper and lower extremities, normal cap refill   Assessment and Plan :    Encounter Diagnoses  Name Primary?  . Weight loss Yes  . Anorexia   . Type II or unspecified type diabetes mellitus without mention of complication, not stated as uncontrolled   . Essential hypertension, benign   . Tobacco use disorder   . Hyperlipidemia   . Wheezing   . Chronic back pain   . Dental caries    disucssed his symptoms and concerns.  We will send for CXR given the wheezing, tobacco use and weight loss.  Begin Nicorrette gum.  Advised 1-800-QUIT-NOW hotline for counseling and potentially free nicotine treatment samples.  Overall we need to investigate the unexpected weight loss.  Given the 20+ weight loss though, his metaglip is too strong and he is having hypoglycemia.  Thus, advised he stop metaglip until we get labs back.   We may end up backing off other medication too.   Refills  on his routine medications pending labs.  Refilled short term Tylenol #3.  Referral back to ortho for back problems, chronic back pain.   He has hx/o EDSI, various medication treatments, PT.  Follow-up pending CXR and labs.

## 2012-08-01 ENCOUNTER — Other Ambulatory Visit: Payer: Self-pay | Admitting: Medical

## 2012-08-08 ENCOUNTER — Telehealth: Payer: Self-pay | Admitting: Internal Medicine

## 2012-08-08 DIAGNOSIS — E785 Hyperlipidemia, unspecified: Secondary | ICD-10-CM

## 2012-08-08 MED ORDER — ATORVASTATIN CALCIUM 80 MG PO TABS: 80.0000 mg | ORAL_TABLET | Freq: Every day | ORAL | Status: DC

## 2012-08-08 NOTE — Telephone Encounter (Signed)
Patient did call me back and he stated that he had been non-compliant with his atorvastatin. I will do his refill for this medication.

## 2012-08-08 NOTE — Telephone Encounter (Signed)
I was going to do this refill this medication. I saw that he had labs done 07/30/12 and Vincenza Hews suspected that he had missed some doses. It was not noted whether or not he had simply missed some doses or he was truly taking this medication as Vincenza Hews had requested to be asked when he was called with lab results. I called him and left a message asking him to return my call either today before 5pm or call back on Monday morning and let us know if was taking his Lipitor daily or if he had missed doses as Vincenza Hews was to determine whether or not he was to remain on this medication or be switched to Crestor. So I did NOT refill this medication. Just an FYI.

## 2012-08-12 ENCOUNTER — Encounter: Payer: Medicare Other | Admitting: Medical

## 2012-08-18 ENCOUNTER — Telehealth: Payer: Self-pay | Admitting: Internal Medicine

## 2012-08-18 NOTE — Telephone Encounter (Signed)
Eric Hayes is setting this up again  I called pt to inform him would have to see ortho chandra set this up one time before and pt no showed

## 2012-08-18 NOTE — Telephone Encounter (Signed)
[facility] refuse. The note from Vincenza Hews says he should of been referred to orthopedics for followup on his back pain

## 2012-08-18 NOTE — Telephone Encounter (Signed)
Refill request for acetaminophen-cod to [facility]

## 2012-08-18 NOTE — Telephone Encounter (Signed)
I called the patient and left a detailed message in reference to his appointment to see the ortho. Doctor [facility] [address] [place] , [place] [phone]  08/26/12 @ 300 pm with Dr. Lavella Hammock

## 2012-08-26 DIAGNOSIS — F319 Bipolar disorder, unspecified: Secondary | ICD-10-CM | POA: Diagnosis not present

## 2012-08-26 DIAGNOSIS — Z79899 Other long term (current) drug therapy: Secondary | ICD-10-CM | POA: Diagnosis not present

## 2012-08-26 DIAGNOSIS — M25559 Pain in unspecified hip: Secondary | ICD-10-CM | POA: Diagnosis not present

## 2012-08-26 DIAGNOSIS — M545 Low back pain: Secondary | ICD-10-CM | POA: Diagnosis not present

## 2012-09-01 ENCOUNTER — Encounter (HOSPITAL_COMMUNITY): Payer: Self-pay | Admitting: *Deleted

## 2012-09-01 ENCOUNTER — Emergency Department (HOSPITAL_COMMUNITY)
Admission: EM | Admit: 2012-09-01 | Discharge: 2012-09-01 | Disposition: A | Discharge status: Home / Self Care | Payer: Medicare Other | Attending: Emergency Medicine | Admitting: Emergency Medicine

## 2012-09-01 DIAGNOSIS — F411 Generalized anxiety disorder: Secondary | ICD-10-CM | POA: Insufficient documentation

## 2012-09-01 DIAGNOSIS — L0291 Cutaneous abscess, unspecified: Secondary | ICD-10-CM

## 2012-09-01 DIAGNOSIS — Z880 Allergy status to penicillin: Secondary | ICD-10-CM | POA: Diagnosis not present

## 2012-09-01 DIAGNOSIS — F3130 Bipolar disorder, current episode depressed, mild or moderate severity, unspecified: Secondary | ICD-10-CM | POA: Insufficient documentation

## 2012-09-01 DIAGNOSIS — I10 Essential (primary) hypertension: Secondary | ICD-10-CM | POA: Diagnosis not present

## 2012-09-01 DIAGNOSIS — L03317 Cellulitis of buttock: Secondary | ICD-10-CM | POA: Insufficient documentation

## 2012-09-01 DIAGNOSIS — G4730 Sleep apnea, unspecified: Secondary | ICD-10-CM | POA: Diagnosis not present

## 2012-09-01 DIAGNOSIS — R609 Edema, unspecified: Secondary | ICD-10-CM | POA: Insufficient documentation

## 2012-09-01 DIAGNOSIS — G8929 Other chronic pain: Secondary | ICD-10-CM | POA: Diagnosis not present

## 2012-09-01 DIAGNOSIS — L0231 Cutaneous abscess of buttock: Secondary | ICD-10-CM | POA: Diagnosis not present

## 2012-09-01 DIAGNOSIS — L02219 Cutaneous abscess of trunk, unspecified: Secondary | ICD-10-CM | POA: Insufficient documentation

## 2012-09-01 DIAGNOSIS — E119 Type 2 diabetes mellitus without complications: Secondary | ICD-10-CM | POA: Insufficient documentation

## 2012-09-01 DIAGNOSIS — M549 Dorsalgia, unspecified: Secondary | ICD-10-CM | POA: Insufficient documentation

## 2012-09-01 DIAGNOSIS — F17200 Nicotine dependence, unspecified, uncomplicated: Secondary | ICD-10-CM | POA: Diagnosis not present

## 2012-09-01 MED ORDER — OXYCODONE-ACETAMINOPHEN 5-325 MG PO TABS: 1.0000 | ORAL_TABLET | ORAL | Status: AC | PRN

## 2012-09-01 MED ORDER — DOXYCYCLINE HYCLATE 100 MG PO CAPS: 100.0000 mg | ORAL_CAPSULE | Freq: Two times a day (BID) | ORAL | Status: DC

## 2012-09-01 MED ORDER — DOXYCYCLINE HYCLATE 100 MG PO TABS
100.0000 mg | ORAL_TABLET | Freq: Once | ORAL | Status: AC
  Administered 2012-09-01: 100 mg via ORAL
  Filled 2012-09-01: qty 1

## 2012-09-01 MED ORDER — OXYCODONE-ACETAMINOPHEN 5-325 MG PO TABS
1.0000 | ORAL_TABLET | Freq: Once | ORAL | Status: AC
  Administered 2012-09-01: 1 via ORAL
  Filled 2012-09-01: qty 1

## 2012-09-01 NOTE — ED Notes (Signed)
T. Triplett, PA at bedside. 

## 2012-09-01 NOTE — ED Notes (Signed)
Abscess to rt buttock and low  abd

## 2012-09-01 NOTE — ED Notes (Signed)
Pt with an old abscess to bottom and another one to mid lower abd, denies fever or N/V/D

## 2012-09-02 DIAGNOSIS — IMO0002 Reserved for concepts with insufficient information to code with codable children: Secondary | ICD-10-CM | POA: Diagnosis not present

## 2012-09-02 DIAGNOSIS — M545 Low back pain: Secondary | ICD-10-CM | POA: Diagnosis not present
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
## 2012-09-04 ENCOUNTER — Telehealth (HOSPITAL_COMMUNITY): Payer: Self-pay | Admitting: Emergency Medicine

## 2012-09-04 LAB — CULTURE, ROUTINE-ABSCESS

## 2012-09-04 NOTE — ED Notes (Signed)
Results called from [facility]. Abscess Rt Buttocks ->  (+) Moderate MRSA.  Rx given in ED for Doxycycline -> sensitive to the same.  Call and notify pt.  General FYI set for MRSA Hx

## 2012-09-04 NOTE — ED Provider Notes (Signed)
Medical screening examination/treatment/procedure(s) were performed by non-physician practitioner and as supervising physician I was immediately available for consultation/collaboration.   Brennon Otterness, MD 09/04/12 0704 

## 2012-09-05 ENCOUNTER — Telehealth (HOSPITAL_COMMUNITY): Payer: Self-pay | Admitting: *Deleted

## 2012-09-07 ENCOUNTER — Telehealth (HOSPITAL_COMMUNITY): Payer: Self-pay | Admitting: Emergency Medicine

## 2012-09-07 NOTE — ED Notes (Signed)
Patient called back and was informed of +MRSA and appropriate treatment. °

## 2012-09-11 DIAGNOSIS — M47817 Spondylosis without myelopathy or radiculopathy, lumbosacral region: Secondary | ICD-10-CM | POA: Diagnosis not present

## 2012-09-16 ENCOUNTER — Encounter: Payer: Self-pay | Admitting: Medical

## 2012-09-16 ENCOUNTER — Ambulatory Visit (INDEPENDENT_AMBULATORY_CARE_PROVIDER_SITE_OTHER): Payer: Medicare Other | Admitting: Medical

## 2012-09-16 ENCOUNTER — Other Ambulatory Visit: Payer: Self-pay | Admitting: Medical

## 2012-09-16 VITALS — BP 120/70 | HR 82 | Temp 97.7°F | Resp 16 | Wt 258.0 lb

## 2012-09-16 DIAGNOSIS — J309 Allergic rhinitis, unspecified: Secondary | ICD-10-CM

## 2012-09-16 DIAGNOSIS — Z23 Encounter for immunization: Secondary | ICD-10-CM | POA: Diagnosis not present

## 2012-09-16 DIAGNOSIS — R351 Nocturia: Secondary | ICD-10-CM

## 2012-09-16 DIAGNOSIS — N529 Male erectile dysfunction, unspecified: Secondary | ICD-10-CM | POA: Diagnosis not present

## 2012-09-16 DIAGNOSIS — Z125 Encounter for screening for malignant neoplasm of prostate: Secondary | ICD-10-CM | POA: Diagnosis not present

## 2012-09-16 DIAGNOSIS — G43909 Migraine, unspecified, not intractable, without status migrainosus: Secondary | ICD-10-CM | POA: Diagnosis not present

## 2012-09-16 DIAGNOSIS — R6889 Other general symptoms and signs: Secondary | ICD-10-CM

## 2012-09-16 DIAGNOSIS — M47817 Spondylosis without myelopathy or radiculopathy, lumbosacral region: Secondary | ICD-10-CM | POA: Diagnosis not present

## 2012-09-16 DIAGNOSIS — M545 Low back pain: Secondary | ICD-10-CM | POA: Diagnosis not present

## 2012-09-16 MED ORDER — FLUTICASONE PROPIONATE 50 MCG/ACT NA SUSP: 2.0000 | Freq: Every day | NASAL | Status: DC

## 2012-09-16 NOTE — Progress Notes (Signed)
Subjective: Here for multiple c/o.   Here for recheck on hypoglycemia.  Since last visit he stopped his diabetes medication, and he brought glucose reading in today and all are between 108-127 fasting morning readings.  He has been feeling fine in this regard.  Of note, he has lost significant weight through diet and exercise.   He has been having bad allergy flare ups.   Not using anything, but staying congested in the nose, sneezing, runny nose.     Saw [facility] ortho recently, has MRI results that are suppose to be back today regarding low back.  He says that the ortho doctor told to ask [place] about restarting neurontin QHS.Marland Kitchen    He has a new c/o 43mo hx/o ED.   Not able to get any erections currently.   No morning erections, none with intercourse.  This has him and wife worried.  No prior similar issue.  He denies hx/o prostate problems but does get up multiple times nightly to urinate.  No family hx/o prostate problems or prostate cancer.  He does have hx/o chronic back pain but denies numbness, tingling, weakness, no recent change or worsening of his back pain either.    Past Medical History  Diagnosis Date  . DVT (deep venous thrombosis) 8/11    [facility]  . Sleep apnea 7/11    CPAP  . Depression   . Anxiety   . Back pain, chronic   . Allergy   . Edema   . Diabetes mellitus 1/12  . Hypertension 7/11  . Bipolar disorder     sees Dr. Penelope Galas   ROS as in HPI   Objective:   Physical Exam  Filed Vitals:   09/16/12 1047  BP: 120/70  Pulse: 82  Temp: 97.7 F (36.5 C)  Resp: 16    General appearance: alert, no distress, WD/WN  HEENT: normocephalic, sclerae anicteric, conjunctiva normal, nares with swollen boggy turbinates, clear discharge but no erythema, pharynx normal Oral cavity: MMM, no lesions Neck: supple, no lymphadenopathy, no thyromegaly, no masses Heart: RRR, normal S1, S2, no murmurs Lungs: CTA bilaterally, no wheezes, rhonchi, or rales Pulses: 2+  symmetric, upper and lower extremities, normal cap refill GU: normal male external genitalia, nontender, no mass, no rash, no hernia Rectal: anus normal, prostate mostly non palpable due to pt positions and resistance on exam, but prostate seems borderline enlarged   Assessment and Plan :     Encounter Diagnoses  Name Primary?  . Allergic rhinitis Yes  . ED (erectile dysfunction)   . Nocturia   . Prostate cancer screening   . Need for influenza vaccination    Allergic rhinitis - begin Zyrtec 10mg  OTC daily, begin fluticasone nasal, rx sent  ED - labs today for TST level.  His cause is likely multifactorial.  Hx/o diabetes, hypertension, on BP medication and psychotropics.  Nocturia - PSA screening lab today  Flu vaccine, VIS and counseling given.  Follow up pending labs.

## 2012-09-16 NOTE — Patient Instructions (Signed)
Allergies: begin Zyrtec 10mg  daily OTC at bedtime.  Begin Flonase nasal spray, 1 spray per nostril daily in the morning.    Reflux - lets try samples of Nexium.  Take this once daily in the morning 30-45 minutes before breakfast.  Stop the prilosec for now  We will call with lab results.

## 2012-09-17 LAB — PSA, MEDICARE: PSA: 2.66 ng/mL (ref <=4.00)

## 2012-09-18 ENCOUNTER — Telehealth: Payer: Self-pay | Admitting: Medical

## 2012-09-19 ENCOUNTER — Other Ambulatory Visit: Payer: Self-pay | Admitting: Medical

## 2012-09-19 DIAGNOSIS — E291 Testicular hypofunction: Secondary | ICD-10-CM

## 2012-09-19 LAB — FSH/LH
FSH: 1 m[IU]/mL — ABNORMAL LOW (ref 1.4–18.1)
LH: 2.9 m[IU]/mL (ref 1.5–9.3)

## 2012-09-23 ENCOUNTER — Ambulatory Visit (HOSPITAL_COMMUNITY)
Admission: RE | Admit: 2012-09-23 | Discharge: 2012-09-23 | Disposition: A | Discharge status: Home / Self Care | Payer: Medicare Other | Source: Ambulatory Visit | Attending: Physical Medicine and Rehabilitation | Admitting: Physical Medicine and Rehabilitation

## 2012-09-23 DIAGNOSIS — IMO0001 Reserved for inherently not codable concepts without codable children: Secondary | ICD-10-CM | POA: Insufficient documentation

## 2012-09-23 DIAGNOSIS — M79609 Pain in unspecified limb: Secondary | ICD-10-CM | POA: Insufficient documentation

## 2012-09-23 DIAGNOSIS — E119 Type 2 diabetes mellitus without complications: Secondary | ICD-10-CM | POA: Insufficient documentation

## 2012-09-23 DIAGNOSIS — M6281 Muscle weakness (generalized): Secondary | ICD-10-CM | POA: Insufficient documentation

## 2012-09-23 DIAGNOSIS — M545 Low back pain, unspecified: Secondary | ICD-10-CM | POA: Insufficient documentation

## 2012-09-23 DIAGNOSIS — R262 Difficulty in walking, not elsewhere classified: Secondary | ICD-10-CM | POA: Diagnosis not present

## 2012-09-23 DIAGNOSIS — M2560 Stiffness of unspecified joint, not elsewhere classified: Secondary | ICD-10-CM | POA: Insufficient documentation

## 2012-09-23 DIAGNOSIS — R29898 Other symptoms and signs involving the musculoskeletal system: Secondary | ICD-10-CM | POA: Insufficient documentation
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
## 2012-09-24 NOTE — Telephone Encounter (Signed)
lm

## 2012-09-24 NOTE — Addendum Note (Signed)
Addended by: Janeice Robinson on: 09/24/2012 10:54 AM   Modules accepted: Orders

## 2012-09-25 ENCOUNTER — Other Ambulatory Visit: Payer: Medicare Other

## 2012-09-25 ENCOUNTER — Ambulatory Visit (HOSPITAL_COMMUNITY)
Admission: RE | Admit: 2012-09-25 | Discharge: 2012-09-25 | Disposition: A | Discharge status: Home / Self Care | Payer: Medicare Other | Source: Ambulatory Visit | Attending: Physical Medicine and Rehabilitation | Admitting: Physical Medicine and Rehabilitation

## 2012-09-25 ENCOUNTER — Ambulatory Visit
Admission: RE | Admit: 2012-09-25 | Discharge: 2012-09-25 | Disposition: A | Discharge status: Home / Self Care | Payer: Medicare Other | Source: Ambulatory Visit | Attending: Medical | Admitting: Medical

## 2012-09-25 DIAGNOSIS — M2560 Stiffness of unspecified joint, not elsewhere classified: Secondary | ICD-10-CM

## 2012-09-25 DIAGNOSIS — E236 Other disorders of pituitary gland: Secondary | ICD-10-CM | POA: Diagnosis not present

## 2012-09-25 DIAGNOSIS — R29898 Other symptoms and signs involving the musculoskeletal system: Secondary | ICD-10-CM

## 2012-09-25 DIAGNOSIS — R262 Difficulty in walking, not elsewhere classified: Secondary | ICD-10-CM

## 2012-09-25 DIAGNOSIS — R6889 Other general symptoms and signs: Secondary | ICD-10-CM

## 2012-09-25 MED ORDER — GADOBENATE DIMEGLUMINE 529 MG/ML IV SOLN
10.0000 mL | Freq: Once | INTRAVENOUS | Status: AC | PRN
  Administered 2012-09-25: 10 mL via INTRAVENOUS

## 2012-09-25 NOTE — Progress Notes (Signed)
Physical Therapy Treatment Patient Details  Name: Eric Hayes MRN: [id number] Date of Birth: Feb 15, 1976  Today's Date: 09/25/2012 Time: 0806-0850 PT Time Calculation (min): 44 min  Visit#: 2  of 12   Re-eval: 10/23/12    Authorization: medicare  1 Authorization Visit#:1   of   10  Subjective: Symptoms/Limitations Symptoms: Pt state he has been doing his ex no questions. Pain Assessment Pain Score:   4 Pain Orientation: Left;Lower Pain Type: Chronic pain   Exercise/Treatments     Stretches Active Hamstring Stretch: 3 reps;30 seconds Lower Trunk Rotation: 5 reps (x2) Standing Side Bend: 5 reps Standing Extension: 5 reps Standing Heel Raises: 10 reps Functional Squats: 10 reps   Supine Clam: 5 reps Bent Knee Raise: 10 reps Bridge: 10 reps Straight Leg Raise: 5 reps;Limitations Straight Leg Raises Limitations: floating at 45 degree Isometric Hip Flexion: 10 reps     Physical Therapy Assessment and Plan PT Assessment and Plan Clinical Impression Statement: Pt needed multiple cues for proper technique of exercise as well as body mechanics,(lying in bed),Good form with new ex after multiple verbal cues. PT Plan: Begin T-band for postural stability and wall push up next treatment.  Begin prone ex on 4th treatement. HIP in /out    Goals    Problem List Patient Active Problem List  Diagnosis  . NECK PAIN, ACUTE  . CONTUSION, RIGHT HAND  . Type II or unspecified type diabetes mellitus without mention of complication, not stated as uncontrolled  . Essential hypertension, benign  . Hyperlipidemia  . Pelvic pain  . Chronic back pain  . Microscopic hematuria  . Tobacco use disorder  . Stiffness of joints, not elsewhere classified, multiple sites  . Weakness of left leg  . Difficulty in walking    PT - End of Session Activity Tolerance: Patient tolerated treatment well PT Plan of Care PT Home Exercise Plan: given  GP    RUSSELL,CINDY 09/25/2012, 8:52  AM

## 2012-09-29 ENCOUNTER — Telehealth: Payer: Self-pay | Admitting: Family Medicine

## 2012-09-29 NOTE — Telephone Encounter (Signed)
PATIENT IS AWARE OF HIS APPOINTMENT FOR HIS MRI AT [facility] ON 09/25/12 @ 6 PM. CLS

## 2012-09-30 ENCOUNTER — Other Ambulatory Visit: Payer: Self-pay | Admitting: Medical

## 2012-09-30 ENCOUNTER — Ambulatory Visit (HOSPITAL_COMMUNITY)
Admission: RE | Admit: 2012-09-30 | Discharge: 2012-09-30 | Disposition: A | Discharge status: Home / Self Care | Payer: Medicare Other | Source: Ambulatory Visit | Attending: Physical Medicine and Rehabilitation | Admitting: Physical Medicine and Rehabilitation

## 2012-09-30 DIAGNOSIS — E291 Testicular hypofunction: Secondary | ICD-10-CM

## 2012-09-30 DIAGNOSIS — M2560 Stiffness of unspecified joint, not elsewhere classified: Secondary | ICD-10-CM

## 2012-09-30 NOTE — Progress Notes (Signed)
Physical Therapy Treatment Patient Details  Name: DIYOR MCLEISH MRN: [id number] Date of Birth: 04-14-1976  Today's Date: 09/30/2012 Time: 0937-1000 PT Time Calculation (min): 23 min  Visit#: 3  of 12   Re-eval: 10/23/12 Charges: Therex x 23'  Authorization: Medicare  Authorization Visit#: 2  of 10    Subjective: Symptoms/Limitations Symptoms: Pt has been unable to complete HEP secondary to sick child. Pain Assessment Currently in Pain?: Yes Pain Score:   4 Pain Orientation: Left;Lower   Exercise/Treatments Standing Heel Raises: 15 reps;Limitations Heel Raises Limitations: Toe raises x 15 Functional Squats: 10 reps Scapular Retraction: 10 reps;Theraband Theraband Level (Scapular Retraction): Level 3 (Green) Row: 10 reps;Theraband Theraband Level (Row): Level 3 (Green) Shoulder Extension: 10 reps;Theraband Theraband Level (Shoulder Extension): Level 3 (Green) Supine Bent Knee Raise: 10 reps Bridge: 10 reps Straight Leg Raise: 10 reps Straight Leg Raises Limitations: floating at 45 degree Isometric Hip Flexion: 10 reps  Physical Therapy Assessment and Plan PT Assessment and Plan Clinical Impression Statement: Tx limited by time as pt had to leave 77' early. Pt displays proper abdominal contraction with visable mm quaking due to weakness. Began scapular tband exercises to improve posture with multimodal cueing for form. Pt reports pain decrease to 3/10 at end of session. PT Plan: Continue per PT POC. Begin prone ex and hip roll in/out next session.     Problem List Patient Active Problem List  Diagnosis  . NECK PAIN, ACUTE  . CONTUSION, RIGHT HAND  . Type II or unspecified type diabetes mellitus without mention of complication, not stated as uncontrolled  . Essential hypertension, benign  . Hyperlipidemia  . Pelvic pain  . Chronic back pain  . Microscopic hematuria  . Tobacco use disorder  . Stiffness of joints, not elsewhere classified, multiple sites  .  Weakness of left leg  . Difficulty in walking    PT - End of Session Activity Tolerance: Patient tolerated treatment well General Behavior During Session: [facility] for tasks performed Cognition: [facility] for tasks performed  Seth Bake, PTA 09/30/2012, 10:40 AM

## 2012-10-02 ENCOUNTER — Ambulatory Visit (HOSPITAL_COMMUNITY)
Admission: RE | Admit: 2012-10-02 | Discharge: 2012-10-02 | Disposition: A | Discharge status: Home / Self Care | Payer: Medicare Other | Source: Ambulatory Visit | Attending: Physical Medicine and Rehabilitation | Admitting: Physical Medicine and Rehabilitation

## 2012-10-02 DIAGNOSIS — R262 Difficulty in walking, not elsewhere classified: Secondary | ICD-10-CM

## 2012-10-02 DIAGNOSIS — M2560 Stiffness of unspecified joint, not elsewhere classified: Secondary | ICD-10-CM

## 2012-10-02 DIAGNOSIS — R29898 Other symptoms and signs involving the musculoskeletal system: Secondary | ICD-10-CM

## 2012-10-02 NOTE — Progress Notes (Signed)
Physical Therapy Treatment Patient Details  Name: Eric Hayes MRN: [id number] Date of Birth: 1976-01-09  Today's Date: 10/02/2012 Time: 0930-1016 PT Time Calculation (min): 46 min  Visit#: 4  of 12   Re-eval: 10/23/12 Assessment Diagnosis: Lumbar spondylosis Next MD Visit: 10/30/2012 Charge: therex 44'  Authorization: Medicare  Authorization Visit#: 3  of 10    Subjective: Symptoms/Limitations Symptoms: Pt stated LBP feeling a little better today, pain scale 3/10 L LBP with radicular pain to posterior knee.  Pt reported compliance with HEP, stated the exercises help to reduce pain. Pain Assessment Currently in Pain?: Yes Pain Score:   3 Pain Location: Back Pain Orientation: Lower;Left Pain Radiating Towards: L knee  Objective:   Exercise/Treatments Stretches Active Hamstring Stretch: 3 reps;30 seconds Standing Side Bend: 5 reps Standing Heel Raises: 15 reps;Limitations Heel Raises Limitations: Toe raises x 15 Functional Squats: 15 reps;Limitations Functional Squats Limitations: with cueing for proper form Scapular Retraction: 15 reps;Theraband Theraband Level (Scapular Retraction): Level 3 (Green) Row: 15 reps;Theraband Theraband Level (Row): Level 3 (Green) Shoulder Extension: 15 reps;Theraband Theraband Level (Shoulder Extension): Level 3 (Green) Other Standing Lumbar Exercises: chest st 3x 30", lat st 3x 30" Seated Other Seated Lumbar Exercises: heel roll outs 10x 10" Prone  Straight Leg Raise: 5 reps;3 seconds;5 seconds Other Prone Lumbar Exercises: Heel squeeze 5x 5"   Physical Therapy Assessment and Plan PT Assessment and Plan Clinical Impression Statement: Added heel roll out to improve hip mobility with multimodal cueing to reduce compensation and prone exercises with emphasis on glut strengthening, pt able to complete exercises with cueing for proper form and noted increase fatigue especially L LE. PT Plan: Continue with current PT POC, begin prone  SAR next session and resume supine exercises as time allows.      Goals    Problem List Patient Active Problem List  Diagnosis  . NECK PAIN, ACUTE  . CONTUSION, RIGHT HAND  . Type II or unspecified type diabetes mellitus without mention of complication, not stated as uncontrolled  . Essential hypertension, benign  . Hyperlipidemia  . Pelvic pain  . Chronic back pain  . Microscopic hematuria  . Tobacco use disorder  . Stiffness of joints, not elsewhere classified, multiple sites  . Weakness of left leg  . Difficulty in walking    PT - End of Session Activity Tolerance: Patient tolerated treatment well General Behavior During Session: [facility] for tasks performed Cognition: [facility] for tasks performed  GP    Juel Burrow 10/02/2012, 11:30 AM

## 2012-10-03 ENCOUNTER — Other Ambulatory Visit: Payer: Self-pay | Admitting: Medical

## 2012-10-06 ENCOUNTER — Telehealth: Payer: Self-pay | Admitting: Family Medicine

## 2012-10-06 NOTE — Telephone Encounter (Signed)
I want him to try the testosterone first.  This may work great, we'll have to see. Friday I called in script for topical testosterone therapy, compounded cream.   Use this once daily as per pharmacist, and make sure other people especially children and spouse avoid contact with the cream and his skin where its applied for at least 2 hours after the application .   I called this into [place] pharmacy.  They should be contacting him.

## 2012-10-06 NOTE — Telephone Encounter (Signed)
Patient is aware of his RX that was sent to the pharmacy. CLS

## 2012-10-07 ENCOUNTER — Ambulatory Visit (HOSPITAL_COMMUNITY)
Admission: RE | Admit: 2012-10-07 | Discharge: 2012-10-07 | Disposition: A | Discharge status: Home / Self Care | Payer: Medicare Other | Source: Ambulatory Visit | Attending: Physical Medicine and Rehabilitation | Admitting: Physical Medicine and Rehabilitation

## 2012-10-07 DIAGNOSIS — M6281 Muscle weakness (generalized): Secondary | ICD-10-CM | POA: Insufficient documentation

## 2012-10-07 DIAGNOSIS — IMO0001 Reserved for inherently not codable concepts without codable children: Secondary | ICD-10-CM | POA: Diagnosis not present

## 2012-10-07 DIAGNOSIS — R262 Difficulty in walking, not elsewhere classified: Secondary | ICD-10-CM | POA: Diagnosis not present

## 2012-10-07 NOTE — Progress Notes (Signed)
Physical Therapy Treatment Patient Details  Name: JAQUINTON GERY MRN: [id number] Date of Birth: 1975-12-24  Today's Date: 10/07/2012 Time: 4098-1191 PT Time Calculation (min): 28 min  Visit#: 5  of 12   Re-eval: 10/23/12 Charges: Therex x 25'  Authorization: Medicare  Authorization Time Period:    Authorization Visit#: 4  of 10    Subjective: Symptoms/Limitations Symptoms: Pt states that his painseems to be decreasing.  Pain Assessment Currently in Pain?: Yes Pain Score:   4 Pain Location: Back Pain Orientation: Left;Lower   Exercise/Treatments Standing Functional Squats: 15 reps;Limitations Functional Squats Limitations: with cueing for proper form Scapular Retraction: 15 reps;Theraband Theraband Level (Scapular Retraction): Level 3 (Green) Row: 15 reps;Theraband Theraband Level (Row): Level 3 (Green) Shoulder Extension: 15 reps;Theraband Theraband Level (Shoulder Extension): Level 3 (Green) Supine Bridge: 15 reps Prone  Single Arm Raise: 10 reps Straight Leg Raise: 10 reps Other Prone Lumbar Exercises: Glute set 10x5"   Physical Therapy Assessment and Plan PT Assessment and Plan Clinical Impression Statement: Pt requested to leave 69' early this session. Pt requires multimodal cueing for proper form with functional squats. Pt is unable to contract correct mm with heel squeezes secondary to glute weakness. Modified exercises to prone glute set. Pt displays visible mm fatigue with glute set. Pt reports decreased intensity of pain but still rates pain at 4/10 at end of session. PT Plan: Continue with current PT POC. Begin prone opposite arm/leg raise next session.     Problem List Patient Active Problem List  Diagnosis  . NECK PAIN, ACUTE  . CONTUSION, RIGHT HAND  . Type II or unspecified type diabetes mellitus without mention of complication, not stated as uncontrolled  . Essential hypertension, benign  . Hyperlipidemia  . Pelvic pain  . Chronic back pain  .  Microscopic hematuria  . Tobacco use disorder  . Stiffness of joints, not elsewhere classified, multiple sites  . Weakness of left leg  . Difficulty in walking    PT - End of Session Activity Tolerance: Patient tolerated treatment well General Behavior During Session: [facility] for tasks performed Cognition: [facility] for tasks performed  Seth Bake, PTA 10/07/2012, 11:12 AM

## 2012-10-09 ENCOUNTER — Ambulatory Visit (HOSPITAL_COMMUNITY)
Admission: RE | Admit: 2012-10-09 | Discharge: 2012-10-09 | Disposition: A | Discharge status: Home / Self Care | Payer: Medicare Other | Source: Ambulatory Visit | Attending: Physical Medicine and Rehabilitation | Admitting: Physical Medicine and Rehabilitation

## 2012-10-09 DIAGNOSIS — IMO0001 Reserved for inherently not codable concepts without codable children: Secondary | ICD-10-CM | POA: Insufficient documentation

## 2012-10-09 DIAGNOSIS — M79609 Pain in unspecified limb: Secondary | ICD-10-CM | POA: Diagnosis not present

## 2012-10-09 DIAGNOSIS — M545 Low back pain, unspecified: Secondary | ICD-10-CM | POA: Diagnosis not present

## 2012-10-09 DIAGNOSIS — M6281 Muscle weakness (generalized): Secondary | ICD-10-CM | POA: Diagnosis not present

## 2012-10-09 DIAGNOSIS — E119 Type 2 diabetes mellitus without complications: Secondary | ICD-10-CM | POA: Insufficient documentation

## 2012-10-09 DIAGNOSIS — R262 Difficulty in walking, not elsewhere classified: Secondary | ICD-10-CM | POA: Diagnosis not present

## 2012-10-09 NOTE — Progress Notes (Signed)
Physical Therapy Treatment Patient Details  Name: SKYLIER KRETSCHMER MRN: [id number] Date of Birth: November 25, 1976  Today's Date: 10/09/2012 Time: 1191-4782 (Pt had to leave by 10:05) PT Time Calculation (min): 33 min  Visit#: 6  of 12   Re-eval: 10/23/12 Charges: Therex x 30'  Authorization: Medicare  Authorization Visit#: 5  of 10    Subjective: Symptoms/Limitations Symptoms: "I've had a good few days"  Pain Assessment Currently in Pain?: Yes Pain Score:  (3-4/10) Pain Location: Back Pain Orientation: Left;Lower  Exercise/Treatments Standing Functional Squats: 15 reps;Limitations Functional Squats Limitations: with cueing for proper form Scapular Retraction: 15 reps;Theraband Theraband Level (Scapular Retraction): Level 3 (Green) Row: 15 reps;Theraband Theraband Level (Row): Level 3 (Green) Shoulder Extension: 15 reps;Theraband Theraband Level (Shoulder Extension): Level 3 (Green) Supine Bent Knee Raise: 10 reps Bridge: 15 reps;5 seconds Prone  Single Arm Raise: 10 reps Straight Leg Raise: 10 reps Opposite Arm/Leg Raise: 5 reps  Physical Therapy Assessment and Plan PT Assessment and Plan Clinical Impression Statement: Pt completes therex with improved form an decreased need for cueing. Pt continues to require multimodal cueing with scapualr tband exercises for proper form. Pt displays improved hip extensor contraction with prone hip extenstion. Pt reports pain decrease to 3/10 at end of session. Pain appears to be gradually decreaseing.  PT Plan: Continue with current PT POC.     Problem List Patient Active Problem List  Diagnosis  . NECK PAIN, ACUTE  . CONTUSION, RIGHT HAND  . Type II or unspecified type diabetes mellitus without mention of complication, not stated as uncontrolled  . Essential hypertension, benign  . Hyperlipidemia  . Pelvic pain  . Chronic back pain  . Microscopic hematuria  . Tobacco use disorder  . Stiffness of joints, not elsewhere  classified, multiple sites  . Weakness of left leg  . Difficulty in walking    PT - End of Session Activity Tolerance: Patient tolerated treatment well General Behavior During Session: [facility] for tasks performed Cognition: [facility] for tasks performed  Seth Bake, PTA 10/09/2012, 10:14 AM

## 2012-10-14 ENCOUNTER — Ambulatory Visit (HOSPITAL_COMMUNITY): Payer: Medicare Other | Admitting: *Deleted

## 2012-10-16 ENCOUNTER — Inpatient Hospital Stay (HOSPITAL_COMMUNITY): Admission: RE | Admit: 2012-10-16 | Payer: Medicare Other | Source: Ambulatory Visit | Admitting: Physical Therapy

## 2012-10-21 ENCOUNTER — Ambulatory Visit (HOSPITAL_COMMUNITY): Payer: Medicare Other | Admitting: Physical Therapy

## 2012-10-23 ENCOUNTER — Inpatient Hospital Stay (HOSPITAL_COMMUNITY): Admission: RE | Admit: 2012-10-23 | Payer: Medicare Other | Source: Ambulatory Visit | Admitting: Physical Therapy

## 2012-10-23 ENCOUNTER — Telehealth (HOSPITAL_COMMUNITY): Payer: Self-pay | Admitting: Physical Therapy

## 2012-10-28 ENCOUNTER — Ambulatory Visit (HOSPITAL_COMMUNITY): Payer: Medicare Other | Admitting: *Deleted

## 2012-11-10 ENCOUNTER — Encounter: Payer: Self-pay | Admitting: Medical

## 2012-11-10 ENCOUNTER — Ambulatory Visit (INDEPENDENT_AMBULATORY_CARE_PROVIDER_SITE_OTHER): Payer: Medicare Other | Admitting: Medical

## 2012-11-10 VITALS — BP 130/80 | HR 92 | Temp 97.9°F | Resp 20 | Wt 259.0 lb

## 2012-11-10 DIAGNOSIS — I10 Essential (primary) hypertension: Secondary | ICD-10-CM | POA: Diagnosis not present

## 2012-11-10 DIAGNOSIS — N529 Male erectile dysfunction, unspecified: Secondary | ICD-10-CM | POA: Diagnosis not present

## 2012-11-10 DIAGNOSIS — E119 Type 2 diabetes mellitus without complications: Secondary | ICD-10-CM

## 2012-11-10 DIAGNOSIS — E291 Testicular hypofunction: Secondary | ICD-10-CM

## 2012-11-10 MED ORDER — ROSUVASTATIN CALCIUM 20 MG PO TABS: 20.0000 mg | ORAL_TABLET | Freq: Every day | ORAL | Status: DC

## 2012-11-10 MED ORDER — SILDENAFIL CITRATE 25 MG PO TABS: 25.0000 mg | ORAL_TABLET | Freq: Every day | ORAL | Status: DC | PRN

## 2012-11-10 NOTE — Progress Notes (Signed)
Subjective: Here for recheck.   At last visit he had lost weight through diet and exercise changes.  Not currently taking BP or diabetes medication.  Feeling fine in general.  He has begun TST compounded therapy, but only been on this 3 wk.  He does note improvement in energy and erections, but erection only lasts a few minutes.  Still not functional as he was 5-6 mo ago.   No other new c/o.   Past Medical History  Diagnosis Date  . DVT (deep venous thrombosis) 8/11    [facility]  . Sleep apnea 7/11    CPAP  . Depression   . Anxiety   . Back pain, chronic   . Allergy   . Edema   . Diabetes mellitus 1/12  . Hypertension 7/11  . Bipolar disorder     sees Dr. Penelope Galas   ROS as in HPI  Objective: Gen: wd, wn, nad   Assessment: Encounter Diagnoses  Name Primary?  . Essential hypertension, benign Yes  . Type II or unspecified type diabetes mellitus without mention of complication, not stated as uncontrolled   . ED (erectile dysfunction)   . Hypogonadism male    Plan: HTN - improved since weight loss in the last few months.  Will hold off on Metoprolol for now.    DM type II - much improved with weight loss and diet changes.  Not on medication currently.  ED - begin trial of Viagra.  discussed risks/benefits.  Call report 1wk.  Hypogonadism - c/t topical compounded testosterone cream.  F/u 53mo for labs/recheck.

## 2012-11-13 DIAGNOSIS — M47817 Spondylosis without myelopathy or radiculopathy, lumbosacral region: Secondary | ICD-10-CM | POA: Diagnosis not present

## 2012-11-13 DIAGNOSIS — IMO0002 Reserved for concepts with insufficient information to code with codable children: Secondary | ICD-10-CM | POA: Diagnosis not present

## 2012-11-18 DIAGNOSIS — F319 Bipolar disorder, unspecified: Secondary | ICD-10-CM | POA: Diagnosis not present

## 2012-11-18 DIAGNOSIS — F4310 Post-traumatic stress disorder, unspecified: Secondary | ICD-10-CM | POA: Diagnosis not present

## 2012-11-20 DIAGNOSIS — IMO0002 Reserved for concepts with insufficient information to code with codable children: Secondary | ICD-10-CM | POA: Diagnosis not present

## 2012-12-11 DIAGNOSIS — IMO0002 Reserved for concepts with insufficient information to code with codable children: Secondary | ICD-10-CM | POA: Diagnosis not present

## 2012-12-11 DIAGNOSIS — M47817 Spondylosis without myelopathy or radiculopathy, lumbosacral region: Secondary | ICD-10-CM | POA: Diagnosis not present

## 2012-12-18 DIAGNOSIS — IMO0002 Reserved for concepts with insufficient information to code with codable children: Secondary | ICD-10-CM | POA: Diagnosis not present

## 2012-12-18 DIAGNOSIS — M47817 Spondylosis without myelopathy or radiculopathy, lumbosacral region: Secondary | ICD-10-CM | POA: Diagnosis not present

## 2013-01-12 ENCOUNTER — Encounter: Payer: Self-pay | Admitting: Medical

## 2013-01-12 ENCOUNTER — Ambulatory Visit (INDEPENDENT_AMBULATORY_CARE_PROVIDER_SITE_OTHER): Payer: Medicaid Other | Admitting: Medical

## 2013-01-12 VITALS — BP 120/80 | HR 62 | Temp 98.2°F | Resp 16 | Wt 260.0 lb

## 2013-01-12 DIAGNOSIS — E291 Testicular hypofunction: Secondary | ICD-10-CM | POA: Diagnosis not present

## 2013-01-12 DIAGNOSIS — S4990XA Unspecified injury of shoulder and upper arm, unspecified arm, initial encounter: Secondary | ICD-10-CM

## 2013-01-12 DIAGNOSIS — S4980XA Other specified injuries of shoulder and upper arm, unspecified arm, initial encounter: Secondary | ICD-10-CM

## 2013-01-12 DIAGNOSIS — E785 Hyperlipidemia, unspecified: Secondary | ICD-10-CM | POA: Diagnosis not present

## 2013-01-12 DIAGNOSIS — Z79899 Other long term (current) drug therapy: Secondary | ICD-10-CM

## 2013-01-12 LAB — COMPREHENSIVE METABOLIC PANEL
Alkaline Phosphatase: 85 U/L (ref 39–117)
BUN: 4 mg/dL — ABNORMAL LOW (ref 6–23)
Glucose, Bld: 114 mg/dL — ABNORMAL HIGH (ref 70–99)
Sodium: 142 mEq/L (ref 135–145)
Total Bilirubin: 0.4 mg/dL (ref 0.3–1.2)

## 2013-01-12 LAB — LIPID PANEL
Cholesterol: 91 mg/dL (ref 0–200)
Total CHOL/HDL Ratio: 2.3 Ratio
Triglycerides: 67 mg/dL (ref <150)
VLDL: 13 mg/dL (ref 0–40)

## 2013-01-12 LAB — TSH: TSH: 0.793 u[IU]/mL (ref 0.350–4.500)

## 2013-01-12 MED ORDER — HYDROCODONE-ACETAMINOPHEN 5-325 MG PO TABS: 1.0000 | ORAL_TABLET | Freq: Three times a day (TID) | ORAL | Status: DC | PRN

## 2013-01-12 MED ORDER — CYCLOBENZAPRINE HCL 10 MG PO TABS: 10.0000 mg | ORAL_TABLET | Freq: Three times a day (TID) | ORAL | Status: DC | PRN

## 2013-01-12 NOTE — Progress Notes (Signed)
Subjective: Here for f/u on hypogonadism.  Last visit we started him on compounded Testosterone.  He pays cash monthly.  Insurance doesn't cover.   Seems to be doing ok with this, taking daily.  Here for labs.  energy seem ok.  He was put on Lithium by his psychiatrist a few months ago. hasn't had any lab surveillance, but the agreement he made with his psychiatrist is that he would come here for those labs for convenience.  This is the first we have addressed this at our office.  Suppose to be taking 2 lithium, but only taking one daily as it seems to work fine.  However, last use was night before last as he forgot last nights dose.  Compliant with medication for cholesterol  He reports 1. 5wk ago was helping a friend lifting a couch, had some pain in the left shoulder then, couldn't move it much then or now.  Sore and stiff.  Using Goody powders, tylenol, Advil, left over ultram, nothing helping.  No numbness, tingling, weakness.  No neck pain.    Past Medical History  Diagnosis Date  . DVT (deep venous thrombosis) 8/11    [facility]  . Sleep apnea 7/11    CPAP  . Depression   . Anxiety   . Back pain, chronic   . Allergy   . Edema   . Diabetes mellitus 1/12  . Hypertension 7/11  . Bipolar disorder     sees Dr. Penelope Galas   ROS as in subjective  Objective: Gen: wd, wn, nad Skin: warm, dry, no ecchymosis or erythema Neck: supple, nontender, somewhat decreased ROM, no mass or lymphadenopathy GUY:[id number] generalized left shoulder, tender over AC joint, limited internal and external ROM, pain with over head and abduction motion over 80 degrees, pain with neers, pain with resisted empty can.  otherwise UE MSK exam unremarkable   Assessment: Encounter Diagnoses  Name Primary?  . Hypogonadism male Yes  . Hyperlipidemia   . Lithium use   . Shoulder injury    Plan: Hypogonadism - labs today, c/t compounded TST.  Hyperlipidemia - labs today, c/t same medication  Lithium use  - reiterated risks of medication, possible medication interactions, need for routine lab surveillance.  Labs today  Shoulder injury - advised arm sling 3-4 hours at a time, ice, begin Flexeril QHS, prn use of Vicodin, gradual return to ROM.  If not slowly improving the next 7-10 days, then recheck.

## 2013-01-13 ENCOUNTER — Other Ambulatory Visit: Payer: Self-pay | Admitting: Medical

## 2013-01-13 ENCOUNTER — Telehealth: Payer: Self-pay | Admitting: Family Medicine

## 2013-01-13 LAB — CBC WITH DIFFERENTIAL/PLATELET
Basophils Absolute: 0.1 10*3/uL (ref 0.0–0.1)
HCT: 42.1 % (ref 39.0–52.0)
Hemoglobin: 14.3 g/dL (ref 13.0–17.0)
Lymphocytes Relative: 23 % (ref 12–46)
Monocytes Absolute: 0.6 10*3/uL (ref 0.1–1.0)
Monocytes Relative: 5 % (ref 3–12)
Neutro Abs: 8 10*3/uL — ABNORMAL HIGH (ref 1.7–7.7)
WBC: 12.3 10*3/uL — ABNORMAL HIGH (ref 4.0–10.5)

## 2013-01-13 MED ORDER — TESTOSTERONE PROPIONATE 2 % TD CREA: 3.0000 [IU] | TOPICAL_CREAM | Freq: Every day | TRANSDERMAL | Status: DC

## 2013-01-13 NOTE — Telephone Encounter (Signed)
Per Crosby Oyster PA-C, I called out compound Testosterone 90 grams with 2 refills apply 3 applications daily to the skin to the [facility] [phone]. CLS

## 2013-01-22 DIAGNOSIS — F410 Panic disorder [episodic paroxysmal anxiety] without agoraphobia: Secondary | ICD-10-CM | POA: Diagnosis not present

## 2013-01-22 DIAGNOSIS — IMO0002 Reserved for concepts with insufficient information to code with codable children: Secondary | ICD-10-CM | POA: Diagnosis not present

## 2013-01-22 DIAGNOSIS — M545 Low back pain: Secondary | ICD-10-CM | POA: Diagnosis not present

## 2013-01-22 DIAGNOSIS — F319 Bipolar disorder, unspecified: Secondary | ICD-10-CM | POA: Diagnosis not present

## 2013-01-22 DIAGNOSIS — M47817 Spondylosis without myelopathy or radiculopathy, lumbosacral region: Secondary | ICD-10-CM | POA: Diagnosis not present

## 2013-01-22 DIAGNOSIS — F909 Attention-deficit hyperactivity disorder, unspecified type: Secondary | ICD-10-CM | POA: Diagnosis not present

## 2013-02-02 ENCOUNTER — Telehealth: Payer: Self-pay | Admitting: Internal Medicine

## 2013-02-03 ENCOUNTER — Other Ambulatory Visit: Payer: Self-pay | Admitting: Medical

## 2013-02-03 MED ORDER — HYDROCODONE-ACETAMINOPHEN 5-325 MG PO TABS: 1.0000 | ORAL_TABLET | Freq: Three times a day (TID) | ORAL | Status: DC | PRN

## 2013-02-04 NOTE — Telephone Encounter (Signed)
done

## 2013-02-04 NOTE — Progress Notes (Signed)
Patient is aware of the medication that was called out to the pharmacy and I also reminded him to follow up here for OV and labs.CLS

## 2013-02-10 DIAGNOSIS — F410 Panic disorder [episodic paroxysmal anxiety] without agoraphobia: Secondary | ICD-10-CM | POA: Diagnosis not present

## 2013-02-10 DIAGNOSIS — F988 Other specified behavioral and emotional disorders with onset usually occurring in childhood and adolescence: Secondary | ICD-10-CM | POA: Diagnosis not present

## 2013-02-10 DIAGNOSIS — F319 Bipolar disorder, unspecified: Secondary | ICD-10-CM | POA: Diagnosis not present

## 2013-02-10 DIAGNOSIS — Z79899 Other long term (current) drug therapy: Secondary | ICD-10-CM | POA: Diagnosis not present

## 2013-02-24 ENCOUNTER — Telehealth: Payer: Self-pay | Admitting: Family Medicine

## 2013-02-24 ENCOUNTER — Encounter: Payer: Self-pay | Admitting: Medical

## 2013-02-24 ENCOUNTER — Ambulatory Visit (INDEPENDENT_AMBULATORY_CARE_PROVIDER_SITE_OTHER): Payer: Medicare Other | Admitting: Medical

## 2013-02-24 VITALS — BP 122/80 | HR 88 | Temp 97.7°F | Resp 18 | Wt 260.0 lb

## 2013-02-24 DIAGNOSIS — M542 Cervicalgia: Secondary | ICD-10-CM

## 2013-02-24 DIAGNOSIS — M25512 Pain in left shoulder: Secondary | ICD-10-CM

## 2013-02-24 DIAGNOSIS — M25519 Pain in unspecified shoulder: Secondary | ICD-10-CM | POA: Diagnosis not present

## 2013-02-24 MED ORDER — HYDROCODONE-ACETAMINOPHEN 5-325 MG PO TABS: 1.0000 | ORAL_TABLET | Freq: Three times a day (TID) | ORAL | Status: DC | PRN

## 2013-02-24 NOTE — Telephone Encounter (Signed)
Patient is aware of his appointment to see Dr. Darrol Angel at [facility] on 03/03/13 @ 1030 am. [phone]  CLS

## 2013-02-24 NOTE — Telephone Encounter (Signed)
I called out Vicodin to the patients pharmacy per Crosby Oyster PA-C. CLS

## 2013-02-24 NOTE — Progress Notes (Signed)
Subjective: Here for f/u on left shoulder.  I saw him for the same 01/12/2013 for left shoulder pain after lifting a couch.  He reports pain in back of left shoulder, radiates up through neck.  Worse with lifting left shoulder, motion of the shoulder.   Lying down helps.  Has tried arm sling, been using Vicodin, has used muscle relaxer some, and this did help some.  Denies numbness or tingling of the arms, just sharp pain.   Doesn't has his normal strength in left shoulder.  The day he lifted the couch, didn't not feel pop or pull.  No fall, no other prior injury to the left shoulder.  He does have some night time pain in the shoulder.  No fever.    Past Medical History  Diagnosis Date  . DVT (deep venous thrombosis) 8/11    [facility]  . Sleep apnea 7/11    CPAP  . Depression   . Anxiety   . Back pain, chronic   . Allergy   . Edema   . Diabetes mellitus 1/12  . Hypertension 7/11  . Bipolar disorder     sees Dr. Penelope Galas   ROS as in subjective  Objective: Gen: wd, wn, nad Skin: no erythema or ecchymosis, no warmth of shoulder Neck: tender left lateral and posterolateral neck, somewhat reduced neck extension, pain with lateral flexion and rotation, otherwise neck ROM normal, no mass, no lymphoadenopathy MSK: no obvious deformity or stepoff, tender along left supraspinatus, tender AC joint mildly, decreased left internal and external ROM, pain with crossover, pain with left shoulder abduction and flexion above 80 degrees, pain mildly with apprehension, mild pain with neers, otherwise no pain on other special tests, otherwise bilat UE unremarkable Neuro: normal bilat UE strength, sensation and DTRs Pulses: normal UE   Assessment: Encounter Diagnoses  Name Primary?  . Shoulder pain, acute, left Yes  . Cervicalgia    Exam suggests impingement vs AC joint problem, no obvious rotator cuff problem on exam.   Can't rule out cervical spine disease as well.  He has not responded to  conservative therapy.   Will refer to ortho for further eval and management.

## 2013-02-24 NOTE — Telephone Encounter (Signed)
Message copied by Janeice Robinson on Tue Feb 24, 2013  1:27 PM ------      Message from: Jac Canavan      Created: Tue Feb 24, 2013 10:32 AM       Call out vicodin 5/325 as listed in chart, #20.    ------

## 2013-03-14 ENCOUNTER — Emergency Department (HOSPITAL_COMMUNITY)
Admission: EM | Admit: 2013-03-14 | Discharge: 2013-03-14 | Disposition: A | Discharge status: Home / Self Care | Payer: Medicare Other | Attending: Emergency Medicine | Admitting: Emergency Medicine

## 2013-03-14 ENCOUNTER — Emergency Department (HOSPITAL_COMMUNITY): Payer: Medicare Other

## 2013-03-14 ENCOUNTER — Encounter (HOSPITAL_COMMUNITY): Payer: Self-pay | Admitting: *Deleted

## 2013-03-14 DIAGNOSIS — I10 Essential (primary) hypertension: Secondary | ICD-10-CM | POA: Insufficient documentation

## 2013-03-14 DIAGNOSIS — M549 Dorsalgia, unspecified: Secondary | ICD-10-CM | POA: Diagnosis not present

## 2013-03-14 DIAGNOSIS — R059 Cough, unspecified: Secondary | ICD-10-CM | POA: Diagnosis not present

## 2013-03-14 DIAGNOSIS — G8929 Other chronic pain: Secondary | ICD-10-CM | POA: Insufficient documentation

## 2013-03-14 DIAGNOSIS — F17200 Nicotine dependence, unspecified, uncomplicated: Secondary | ICD-10-CM | POA: Diagnosis not present

## 2013-03-14 DIAGNOSIS — Z872 Personal history of diseases of the skin and subcutaneous tissue: Secondary | ICD-10-CM | POA: Diagnosis not present

## 2013-03-14 DIAGNOSIS — F411 Generalized anxiety disorder: Secondary | ICD-10-CM | POA: Insufficient documentation

## 2013-03-14 DIAGNOSIS — G4730 Sleep apnea, unspecified: Secondary | ICD-10-CM | POA: Diagnosis not present

## 2013-03-14 DIAGNOSIS — Z86718 Personal history of other venous thrombosis and embolism: Secondary | ICD-10-CM | POA: Diagnosis not present

## 2013-03-14 DIAGNOSIS — F319 Bipolar disorder, unspecified: Secondary | ICD-10-CM | POA: Diagnosis not present

## 2013-03-14 DIAGNOSIS — R079 Chest pain, unspecified: Secondary | ICD-10-CM | POA: Diagnosis not present

## 2013-03-14 DIAGNOSIS — Z79899 Other long term (current) drug therapy: Secondary | ICD-10-CM | POA: Diagnosis not present

## 2013-03-14 DIAGNOSIS — R05 Cough: Secondary | ICD-10-CM | POA: Insufficient documentation

## 2013-03-14 DIAGNOSIS — R0789 Other chest pain: Secondary | ICD-10-CM | POA: Diagnosis not present

## 2013-03-14 MED ORDER — OXYCODONE-ACETAMINOPHEN 5-325 MG PO TABS
2.0000 | ORAL_TABLET | Freq: Once | ORAL | Status: AC
  Administered 2013-03-14: 2 via ORAL
  Filled 2013-03-14: qty 2

## 2013-03-14 NOTE — ED Notes (Signed)
MD at bedside. 

## 2013-03-14 NOTE — ED Notes (Signed)
Pt presents to er with c/o sinus congestion intermittent for the past 7 months, right side chest pain that started Tuesday, cough that is productive with yellow sputum production, pain remains constant but is made worse with certain movements
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
## 2013-03-14 NOTE — ED Notes (Signed)
R lower chest pain began Tuesday, escalating to today, with radiating to R lateral chest and R shoulder. No nausea, dizziness.  Cough associated.  Congested intermittently for 7 months.

## 2013-05-11 ENCOUNTER — Encounter: Payer: Self-pay | Admitting: Medical

## 2013-05-11 ENCOUNTER — Ambulatory Visit (INDEPENDENT_AMBULATORY_CARE_PROVIDER_SITE_OTHER): Payer: Medicare Other | Admitting: Medical

## 2013-05-11 VITALS — BP 130/82 | HR 102 | Temp 97.9°F | Resp 20 | Wt 263.0 lb

## 2013-05-11 DIAGNOSIS — G4733 Obstructive sleep apnea (adult) (pediatric): Secondary | ICD-10-CM | POA: Diagnosis not present

## 2013-05-11 DIAGNOSIS — G8929 Other chronic pain: Secondary | ICD-10-CM | POA: Diagnosis not present

## 2013-05-11 DIAGNOSIS — N400 Enlarged prostate without lower urinary tract symptoms: Secondary | ICD-10-CM

## 2013-05-11 DIAGNOSIS — M549 Dorsalgia, unspecified: Secondary | ICD-10-CM

## 2013-05-11 DIAGNOSIS — D492 Neoplasm of unspecified behavior of bone, soft tissue, and skin: Secondary | ICD-10-CM

## 2013-05-11 MED ORDER — TAMSULOSIN HCL 0.4 MG PO CAPS: 0.4000 mg | ORAL_CAPSULE | Freq: Every day | ORAL | Status: DC

## 2013-05-11 MED ORDER — HYDROCODONE-ACETAMINOPHEN 5-325 MG PO TABS: 1.0000 | ORAL_TABLET | Freq: Four times a day (QID) | ORAL | Status: DC | PRN

## 2013-05-11 NOTE — Progress Notes (Signed)
Subjective: Here for 3 issues.  Has skin lesion of left upper back.  Skin lesion has been there a while, but lately seems to have gotten a little bigger.  Wants this checked out.  Concerned as uncle died of ski cancer.    He does get up about once nightly to urinate, has weak urine stream.  Sometimes takes a long time to get urine started.  Has been on medication in the past for enlarged prostate.  C/t to have chronic back pain, radiates around low back around to front of pelvis and at times into scrotum.  Has had this same pattern at least 3 years now.  [facility] been seeing [facility]. Had MRI last year.  Has done PT, chiropractor therapy, NSAIDs, and despite 2 recent EDSI, still no improvement.  Wants referral to pain specialist.  Denies fever, chills, weight loss, night sweats, no bowel issues, no upper abdominal pain, no NVD or constipation,no rash.  No other aggravating or relieving factors.   Has OSA, uses CPAP.  His [facility] recently told him that he needs script from [place] to get new CPAP supplies.  Last sleep study 2.5 year ago.    Past Medical History  Diagnosis Date  . DVT (deep venous thrombosis) 8/11    [facility]  . Sleep apnea 7/11    CPAP  . Depression   . Anxiety   . Back pain, chronic   . Allergy   . Edema   . Hypertension 7/11  . Bipolar disorder     sees Dr. Penelope Galas   ROS as in subjective  Objective: General appearance: alert, no distress, WD/WN  Oral cavity: MMM, no lesions  Neck: supple, no lymphadenopathy, no thyromegaly, no masses  Heart: RRR, normal S1, S2, no murmurs  Lungs: few wheezes, rhonchi, or rales  Abdomen: +bs, soft, mild LLQ tenderness, otherwise nontender, stria on abdomen, non distended, no masses, no hepatomegaly, no splenomegaly  Back: paraspinal lumbar tenderness, flexion and extension limited due to pain, but no obvious scoliosis or midline tenderness  MSK: hamstrings are tight with leg extension, but  otherwise nontender LE, normal ROM otherwise Ext: no calve pain, no edema  Pulses: 2+ symmetric, upper and lower extremities, normal cap refill  Neuro: normal LE strength and sensation, DTRs normal, some difficulty with heel and toe walk    Assessment: Encounter Diagnoses  Name Primary?  . Chronic back pain Yes  . BPH (benign prostatic hyperplasia)   . Neoplasm of skin   . OSA (obstructive sleep apnea)    Plan: Chronic back pain - refilled pain medication, will get additional records from [facility] orthopedics including prior MRI.  Referral to pain management.  BPH - restart Flomax, f/u 88mo  Neoplasm of skin - discussed options of management.  Offered biopsy vs referral vs watch and wait.  He chooses watch and wait.   recheck 3-4 mo.  OSA - [facility].

## 2013-05-19 ENCOUNTER — Telehealth: Payer: Self-pay | Admitting: Family Medicine

## 2013-05-19 NOTE — Telephone Encounter (Signed)
LMOM TO CB. CLS 

## 2013-05-19 NOTE — Telephone Encounter (Signed)
Patient is aware of the referrals and the new medication and to follow up in 2 months. CLS

## 2013-05-19 NOTE — Telephone Encounter (Signed)
Message copied by Janeice Robinson on Tue May 19, 2013  2:40 PM ------      Message from: Jac Canavan      Created: Tue May 12, 2013  5:03 AM       1) call and [facility] and call in order/update order for CPAP supplies.   Last sleep study 2.5 years ago per pt.              2) refer to pain management for chronic back pain.  Send copy of notes including my OV notes, prior L spine xrays in chart, and the [facility] notes.  We have 1 ortho note, but check with Tammy as he signed yesterday to get copy of MRI, recent OV notes from ortho            3) call him and let him know that I sent the prostate medication to pharmacy.  If he notes things seem different on the medication, let me know . Take daily in morning 30 min after breakfast daily.  May experience headache, dry mouth, vision disturbance, but most people tolerate this ok.            4) f/u in 59mo fasting ------

## 2013-05-21 ENCOUNTER — Encounter: Payer: Self-pay | Admitting: Physical Medicine & Rehabilitation

## 2013-05-21 ENCOUNTER — Telehealth: Payer: Self-pay | Admitting: Family Medicine

## 2013-05-21 NOTE — Telephone Encounter (Signed)
Patient is aware of th appointment at the pain clinic 06/22/13 @ 115 pm [place] [phone]. CLS

## 2013-05-26 ENCOUNTER — Other Ambulatory Visit: Payer: Self-pay | Admitting: Family Medicine

## 2013-05-26 DIAGNOSIS — Z79899 Other long term (current) drug therapy: Secondary | ICD-10-CM | POA: Diagnosis not present

## 2013-05-26 DIAGNOSIS — F319 Bipolar disorder, unspecified: Secondary | ICD-10-CM | POA: Diagnosis not present

## 2013-05-26 NOTE — Telephone Encounter (Signed)
THIS ONE OF YOUR PT IS THIS OK

## 2013-05-27 NOTE — Telephone Encounter (Signed)
What is the status of the referral to pain clinic.  They need to take over his pain control.

## 2013-05-29 ENCOUNTER — Other Ambulatory Visit: Payer: Self-pay | Admitting: Medical

## 2013-05-29 MED ORDER — HYDROCODONE-ACETAMINOPHEN 5-325 MG PO TABS: 1.0000 | ORAL_TABLET | Freq: Four times a day (QID) | ORAL | Status: DC | PRN

## 2013-05-29 NOTE — Progress Notes (Signed)
I called out the patients medication per David Tysinger PAC. CLS 

## 2013-05-29 NOTE — Telephone Encounter (Signed)
Patient's appointment is on 06/22/13 at the [facility]. CLS

## 2013-06-17 ENCOUNTER — Other Ambulatory Visit: Payer: Self-pay | Admitting: Medical

## 2013-06-17 NOTE — Telephone Encounter (Signed)
Is this okay to refill? 

## 2013-06-18 NOTE — Telephone Encounter (Signed)
HAVE YOU DONE THIS  

## 2013-06-19 MED ORDER — HYDROCODONE-ACETAMINOPHEN 5-325 MG PO TABS: 1.0000 | ORAL_TABLET | Freq: Four times a day (QID) | ORAL | Status: DC | PRN

## 2013-06-19 NOTE — Telephone Encounter (Signed)
I called out the patients medication per David Tysinger PAC. CLS 

## 2013-06-19 NOTE — Addendum Note (Signed)
Addended by: Jac Canavan on: 06/19/2013 08:37 AM   Modules accepted: Orders

## 2013-06-22 ENCOUNTER — Ambulatory Visit: Payer: Medicare Other | Admitting: Physical Medicine & Rehabilitation

## 2013-07-06 ENCOUNTER — Other Ambulatory Visit: Payer: Self-pay | Admitting: Medical

## 2013-07-08 ENCOUNTER — Encounter: Payer: Self-pay | Admitting: Medical

## 2013-07-08 ENCOUNTER — Ambulatory Visit (INDEPENDENT_AMBULATORY_CARE_PROVIDER_SITE_OTHER): Payer: Medicare Other | Admitting: Medical

## 2013-07-08 VITALS — BP 130/80 | HR 100 | Temp 98.1°F | Resp 20 | Wt 269.0 lb

## 2013-07-08 DIAGNOSIS — E119 Type 2 diabetes mellitus without complications: Secondary | ICD-10-CM

## 2013-07-08 DIAGNOSIS — N400 Enlarged prostate without lower urinary tract symptoms: Secondary | ICD-10-CM

## 2013-07-08 DIAGNOSIS — R51 Headache: Secondary | ICD-10-CM

## 2013-07-08 DIAGNOSIS — R209 Unspecified disturbances of skin sensation: Secondary | ICD-10-CM | POA: Diagnosis not present

## 2013-07-08 DIAGNOSIS — G8929 Other chronic pain: Secondary | ICD-10-CM

## 2013-07-08 DIAGNOSIS — M549 Dorsalgia, unspecified: Secondary | ICD-10-CM

## 2013-07-08 DIAGNOSIS — R202 Paresthesia of skin: Secondary | ICD-10-CM

## 2013-07-08 MED ORDER — HYDROCODONE-ACETAMINOPHEN 5-325 MG PO TABS: 1.0000 | ORAL_TABLET | Freq: Four times a day (QID) | ORAL | Status: DC | PRN

## 2013-07-08 NOTE — Progress Notes (Signed)
Subjective: Here mainly today to refill pain medications.   He had appt to establish care with pain clinic 7/21, but had to cancel as his wife's niece passed away.   He doesn't have rescheduled appt until September.   Still c/o   C/t to have chronic back pain, radiates around low back around to front of pelvis and at times into scrotum.  Has had this same pattern at least 3 years now.  Has been seeing [facility]. Had MRI last year.  Has done PT, chiropractor therapy, NSAIDs, and despite 2 recent EDSI, still no improvement.  He does note some tingling in scrotum, but no numbness.   Denies fever, chills, weight loss, night sweats, no bowel issues, no incontinence, no upper abdominal pain, no NVD or constipation,no rash.  No other aggravating or relieving factors.   His urine stream seems to be doing fine on Flomax.  No blood in urine, odor, burning with urination, no hesitation.  Recently started seeing Neurology at [facility] for chronic headaches.   On Topamax once daily which has helped his headaches.   Still seeing psychiatry for bipolar and focus issues.  Hx/o diabetes type II, and we took him back off diabetes medications earlier this year after weight loss and diet changes, and after getting low glucose readings on several occasions.   Past Medical History  Diagnosis Date  . DVT (deep venous thrombosis) 8/11    [facility]  . Sleep apnea 7/11    CPAP  . Depression   . Anxiety   . Back pain, chronic   . Allergy   . Edema   . Hypertension 7/11  . Bipolar disorder     sees Dr. Penelope Galas   ROS as in subjective  Objective: General appearance: alert, no distress, WD/WN  Abdomen: +bs, soft, mild LLQ tenderness, otherwise nontender, stria on abdomen, non distended, no masses, no hepatomegaly, no splenomegaly  Back: paraspinal lumbar tenderness, flexion and extension limited due to pain, but no obvious scoliosis or midline tenderness  MSK: hamstrings are tight  with leg extension, but otherwise nontender LE, normal ROM otherwise Ext: no calve pain, no edema  Pulses: 2+ symmetric, upper and lower extremities, normal cap refill  Neuro: normal LE strength and sensation, DTRs normal, some difficulty with heel and toe walk    Assessment: Encounter Diagnoses  Name Primary?  . Chronic back pain Yes  . Paresthesia   . Chronic headaches   . Type II or unspecified type diabetes mellitus without mention of complication, not stated as uncontrolled   . BPH (benign prostatic hyperplasia)     Plan: Chronic back pain, paresthesias - refilled pain medication,but advised that if he cancels/reschedules again for pain clinic, I will not refill the narcotic.   I will get additional records from [facility] including prior MRI.  C/t with plan to see pain management.  Chronic headaches - I wasn't aware until today that he has been seeing neurology.   We will request records.  Topamax could be increased to BID to help with his chronic pain.  DM type II - HgbA1C today of 5.5%.    BPH - seems to be improved on flomax  Addendum: Reviewed records that came in from orthopedics.  MRI L spine without contrast 09/11/12 Impression: right paracentral and lateral recess disc protrusion at T12-L1 causes borderline right central stenosis Borderline left subarticular lateral recess stenosis at L5-S1 due to facet arthropathy.  Reviewed several prior orthopedic notes from  9/13 until 2/14.   Diagnosis was back and leg pain with MRI evidence of lumbar spondylosis and subarticular stenosis L5-S1.  He has previously tried PT, 2 EDSI, TENS unit, Flexeril, Gabapentin, and narcotic pain medication.  At this point I advised that I will not keep refilling his narcoitc pain medication, particularly if he fails to make the pain management appt due to his personal situation that is out of my control.    I did refill his pain medication for now.  Pending pain management appt, may  need to consider neurosurgery consult since he is having worsening pain, some scrotal paresthesias, but no incontinence.  F/u with pain management as planned in September.

## 2013-07-13 ENCOUNTER — Encounter: Payer: Self-pay | Admitting: Medical

## 2013-07-21 ENCOUNTER — Other Ambulatory Visit: Payer: Self-pay | Admitting: Medical

## 2013-07-21 NOTE — Telephone Encounter (Signed)
Okay to renew but no refills 

## 2013-07-21 NOTE — Telephone Encounter (Signed)
IS THIS OK 

## 2013-08-01 ENCOUNTER — Emergency Department (HOSPITAL_COMMUNITY): Payer: Medicare Other

## 2013-08-01 ENCOUNTER — Emergency Department (HOSPITAL_COMMUNITY)
Admission: EM | Admit: 2013-08-01 | Discharge: 2013-08-01 | Disposition: A | Discharge status: Home / Self Care | Payer: Medicare Other | Attending: Emergency Medicine | Admitting: Emergency Medicine

## 2013-08-01 ENCOUNTER — Encounter (HOSPITAL_COMMUNITY): Payer: Self-pay | Admitting: *Deleted

## 2013-08-01 DIAGNOSIS — F411 Generalized anxiety disorder: Secondary | ICD-10-CM | POA: Diagnosis not present

## 2013-08-01 DIAGNOSIS — R339 Retention of urine, unspecified: Secondary | ICD-10-CM | POA: Insufficient documentation

## 2013-08-01 DIAGNOSIS — Z86718 Personal history of other venous thrombosis and embolism: Secondary | ICD-10-CM | POA: Diagnosis not present

## 2013-08-01 DIAGNOSIS — I10 Essential (primary) hypertension: Secondary | ICD-10-CM | POA: Insufficient documentation

## 2013-08-01 DIAGNOSIS — R319 Hematuria, unspecified: Secondary | ICD-10-CM | POA: Insufficient documentation

## 2013-08-01 DIAGNOSIS — R109 Unspecified abdominal pain: Secondary | ICD-10-CM | POA: Diagnosis not present

## 2013-08-01 DIAGNOSIS — F17200 Nicotine dependence, unspecified, uncomplicated: Secondary | ICD-10-CM | POA: Diagnosis not present

## 2013-08-01 DIAGNOSIS — G4730 Sleep apnea, unspecified: Secondary | ICD-10-CM | POA: Insufficient documentation

## 2013-08-01 DIAGNOSIS — F319 Bipolar disorder, unspecified: Secondary | ICD-10-CM | POA: Diagnosis not present

## 2013-08-01 DIAGNOSIS — G8929 Other chronic pain: Secondary | ICD-10-CM | POA: Insufficient documentation

## 2013-08-01 DIAGNOSIS — Z880 Allergy status to penicillin: Secondary | ICD-10-CM | POA: Insufficient documentation

## 2013-08-01 DIAGNOSIS — Z79899 Other long term (current) drug therapy: Secondary | ICD-10-CM | POA: Insufficient documentation

## 2013-08-01 LAB — URINE MICROSCOPIC-ADD ON

## 2013-08-01 LAB — URINALYSIS, ROUTINE W REFLEX MICROSCOPIC
Glucose, UA: NEGATIVE mg/dL
Leukocytes, UA: NEGATIVE
Specific Gravity, Urine: 1.01 (ref 1.005–1.030)

## 2013-08-01 LAB — CBC WITH DIFFERENTIAL/PLATELET
Basophils Relative: 0 % (ref 0–1)
Eosinophils Absolute: 0.8 10*3/uL — ABNORMAL HIGH (ref 0.0–0.7)
Hemoglobin: 14.5 g/dL (ref 13.0–17.0)
MCHC: 34.6 g/dL (ref 30.0–36.0)
Monocytes Relative: 8 % (ref 3–12)
Neutro Abs: 8.9 10*3/uL — ABNORMAL HIGH (ref 1.7–7.7)
Neutrophils Relative %: 61 % (ref 43–77)
Platelets: 373 10*3/uL (ref 150–400)
RBC: 4.61 MIL/uL (ref 4.22–5.81)

## 2013-08-01 LAB — BASIC METABOLIC PANEL
BUN: 6 mg/dL (ref 6–23)
Calcium: 9.1 mg/dL (ref 8.4–10.5)
GFR calc Af Amer: >90 mL/min (ref >90)
GFR calc non Af Amer: >90 mL/min (ref >90)
Potassium: 3.5 mEq/L (ref 3.5–5.1)
Sodium: 134 mEq/L — ABNORMAL LOW (ref 135–145)

## 2013-08-01 MED ORDER — OXYCODONE-ACETAMINOPHEN 5-325 MG PO TABS
2.0000 | ORAL_TABLET | Freq: Once | ORAL | Status: AC
  Administered 2013-08-01: 2 via ORAL
  Filled 2013-08-01: qty 2

## 2013-08-01 MED ORDER — OXYCODONE-ACETAMINOPHEN 5-325 MG PO TABS
2.0000 | ORAL_TABLET | Freq: Once | ORAL | Status: AC
  Administered 2013-08-01: 2 via ORAL
  Filled 2013-08-01 (×2): qty 1

## 2013-08-01 MED ORDER — TAMSULOSIN HCL 0.4 MG PO CAPS
0.8000 mg | ORAL_CAPSULE | Freq: Once | ORAL | Status: AC
  Administered 2013-08-01: 0.8 mg via ORAL
  Filled 2013-08-01: qty 2

## 2013-08-01 MED ORDER — TAMSULOSIN HCL 0.4 MG PO CAPS
ORAL_CAPSULE | ORAL | Status: AC
  Filled 2013-08-01: qty 2

## 2013-08-01 MED ORDER — OXYCODONE-ACETAMINOPHEN 5-325 MG PO TABS: 1.0000 | ORAL_TABLET | ORAL | Status: DC | PRN

## 2013-08-01 MED ORDER — SULFAMETHOXAZOLE-TMP DS 800-160 MG PO TABS
ORAL_TABLET | ORAL | Status: AC
  Administered 2013-08-01: 1
  Filled 2013-08-01: qty 1

## 2013-08-01 MED ORDER — IOHEXOL 300 MG/ML  SOLN
50.0000 mL | Freq: Once | INTRAMUSCULAR | Status: AC | PRN
  Administered 2013-08-01: 50 mL via ORAL

## 2013-08-01 MED ORDER — SULFAMETHOXAZOLE-TRIMETHOPRIM 800-160 MG PO TABS: 1.0000 | ORAL_TABLET | Freq: Two times a day (BID) | ORAL | Status: DC

## 2013-08-01 MED ORDER — IOHEXOL 300 MG/ML  SOLN
100.0000 mL | Freq: Once | INTRAMUSCULAR | Status: AC | PRN
  Administered 2013-08-01: 100 mL via INTRAVENOUS

## 2013-08-01 NOTE — ED Provider Notes (Signed)
Hematuria - no signs of abnormalities on CT - pt informed, Bactrim, Urology f/u.    Vida Roller, MD 08/01/13 2229

## 2013-08-01 NOTE — ED Notes (Signed)
Pt c/o mid back pain, lower abdominal pain, urinary retention, and states that it doesn't burn to urinate but it hurts to urinate. Pain started last night.

## 2013-08-01 NOTE — ED Notes (Addendum)
Voided 30cc pink ua, pt declines foley, dr cook concurs.
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
## 2013-08-03 LAB — URINE CULTURE
Colony Count: NO GROWTH
Culture: NO GROWTH

## 2013-08-07 ENCOUNTER — Other Ambulatory Visit: Payer: Self-pay | Admitting: Family Medicine

## 2013-08-07 NOTE — Telephone Encounter (Signed)
THIS IS YOUR PT °

## 2013-08-10 NOTE — Telephone Encounter (Signed)
Called in med 

## 2013-08-17 DIAGNOSIS — F319 Bipolar disorder, unspecified: Secondary | ICD-10-CM | POA: Diagnosis not present

## 2013-08-17 DIAGNOSIS — F909 Attention-deficit hyperactivity disorder, unspecified type: Secondary | ICD-10-CM | POA: Diagnosis not present

## 2013-08-24 ENCOUNTER — Encounter: Payer: Medicare Other | Attending: Physical Medicine & Rehabilitation

## 2013-08-24 ENCOUNTER — Ambulatory Visit (HOSPITAL_BASED_OUTPATIENT_CLINIC_OR_DEPARTMENT_OTHER): Payer: Medicare Other | Admitting: Physical Medicine & Rehabilitation

## 2013-08-24 ENCOUNTER — Encounter: Payer: Self-pay | Admitting: Physical Medicine & Rehabilitation

## 2013-08-24 VITALS — BP 168/79 | HR 105 | Resp 14 | Ht 75.0 in | Wt 272.0 lb

## 2013-08-24 DIAGNOSIS — M47817 Spondylosis without myelopathy or radiculopathy, lumbosacral region: Secondary | ICD-10-CM | POA: Diagnosis not present

## 2013-08-24 DIAGNOSIS — M549 Dorsalgia, unspecified: Secondary | ICD-10-CM | POA: Diagnosis not present

## 2013-08-24 DIAGNOSIS — M79609 Pain in unspecified limb: Secondary | ICD-10-CM | POA: Insufficient documentation

## 2013-08-24 DIAGNOSIS — M25569 Pain in unspecified knee: Secondary | ICD-10-CM | POA: Insufficient documentation

## 2013-08-24 DIAGNOSIS — M48061 Spinal stenosis, lumbar region without neurogenic claudication: Secondary | ICD-10-CM | POA: Diagnosis not present

## 2013-08-24 DIAGNOSIS — G8929 Other chronic pain: Secondary | ICD-10-CM | POA: Diagnosis not present

## 2013-08-24 MED ORDER — MELOXICAM 7.5 MG PO TABS: 7.5000 mg | ORAL_TABLET | Freq: Every day | ORAL | Status: DC

## 2013-08-24 MED ORDER — CYCLOBENZAPRINE HCL 5 MG PO TABS: 5.0000 mg | ORAL_TABLET | Freq: Three times a day (TID) | ORAL | Status: DC | PRN

## 2013-08-24 MED ORDER — DIAZEPAM 10 MG PO TABS: 10.0000 mg | ORAL_TABLET | Freq: Once | ORAL | Status: DC
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
## 2013-08-24 NOTE — Patient Instructions (Signed)
Walk 10 minutes 3 times per day Continue physical therapy exercises Take Valium prior to injection

## 2013-08-26 ENCOUNTER — Encounter: Payer: Self-pay | Admitting: Medical

## 2013-08-26 ENCOUNTER — Ambulatory Visit (INDEPENDENT_AMBULATORY_CARE_PROVIDER_SITE_OTHER): Payer: Medicare Other | Admitting: Medical

## 2013-08-26 VITALS — BP 112/80 | HR 78 | Temp 98.0°F | Resp 16 | Wt 272.0 lb

## 2013-08-26 DIAGNOSIS — F17200 Nicotine dependence, unspecified, uncomplicated: Secondary | ICD-10-CM

## 2013-08-26 DIAGNOSIS — F329 Major depressive disorder, single episode, unspecified: Secondary | ICD-10-CM

## 2013-08-26 DIAGNOSIS — E119 Type 2 diabetes mellitus without complications: Secondary | ICD-10-CM | POA: Diagnosis not present

## 2013-08-26 DIAGNOSIS — I10 Essential (primary) hypertension: Secondary | ICD-10-CM

## 2013-08-26 DIAGNOSIS — R319 Hematuria, unspecified: Secondary | ICD-10-CM

## 2013-08-26 DIAGNOSIS — M549 Dorsalgia, unspecified: Secondary | ICD-10-CM

## 2013-08-26 DIAGNOSIS — G8929 Other chronic pain: Secondary | ICD-10-CM

## 2013-08-26 DIAGNOSIS — G43909 Migraine, unspecified, not intractable, without status migrainosus: Secondary | ICD-10-CM

## 2013-08-26 DIAGNOSIS — F3289 Other specified depressive episodes: Secondary | ICD-10-CM

## 2013-08-26 NOTE — Progress Notes (Signed)
  Subjective:   HPI  Eric Hayes is a 37 y.o. male who presents with questions.  He has a history of hypertension, hyperlipidemia, and diabetes, and has been on medication prior, but with weight loss this past year was able to come off medications for both diabetes and blood pressure.  Recently when he went to his new pain clinic and also when he saw his psychiatrist, his blood pressures were apparently elevated.  He denies any chest pain, swelling, shortness of breath, palpitations, or syncope.  He did establish with the pain clinic, and they plan to do some steroid injections in his low back.  He has had these injections before, and wants my opinion if he should do this again.  He went to the emergency department recently for blood in the urine and belly pain.  No specific diagnosis was found. He denies any ongoing problems with this now.  No other c/o.   The following portions of the patient's history were reviewed and updated as appropriate: allergies, current medications, past family history, past medical history, past social history, past surgical history and problem list.  Past Medical History  Diagnosis Date  . DVT (deep venous thrombosis) 8/11    [facility]  . Sleep apnea 7/11    CPAP  . Depression   . Anxiety   . Back pain, chronic     followed by pain clinic 9/14  . Allergy   . Edema   . Hypertension 7/11  . Bipolar disorder     sees Dr. Penelope Galas  . Diabetes mellitus without complication 1/12  . Migraine headache     followed by neurology  . BPH (benign prostatic hypertrophy)   . Hyperlipidemia     Objective:   Physical Exam  General appearance: alert, no distress, WD/WN, white male Otherwise not examined   Assessment :    Encounter Diagnoses  Name Primary?  . Essential hypertension, benign Yes  . Type II or unspecified type diabetes mellitus without mention of complication, not stated as uncontrolled   . Chronic back pain   . Tobacco use disorder    . Depression   . Migraine   . Hematuria     Plan:   Hypertension-has been on medication prior, but in this past year with weight loss was able to stop medications.  I reassured him that all his recent blood pressures have been fine here. Recheck in January.  Type 2 diabetes-similarly, with weight loss this past year we were able to stop medications.  Advise he work on losing a little more weight, continue healthy diet. Recheck in January with labs including micro albumin.  Chronic back pain-I answered his questions regarding his recent visit with chronic pain clinic. I advise that he use their recommendations and proceed with the procedures as planned.  Tobacco-again advise he stop tobacco  Depression-followed by psychiatry  Migraines-followed by neurology  Hematuria- reviewed his recent emergency department visit notes and CT scan which was negative for stones or obvious cause of blood in the urine.  This has been an intermittent problem. He has seen urology prior for this with no obvious cause of the hematuria.  Recheck in January on microscopic hematuria, recheck sooner if gross hematuria

## 2013-08-28 ENCOUNTER — Telehealth: Payer: Self-pay | Admitting: Family Medicine

## 2013-08-28 NOTE — Telephone Encounter (Signed)
Message copied by Janeice Robinson on Fri Aug 28, 2013  1:49 PM ------      Message from: Jac Canavan      Created: Wed Aug 26, 2013  9:04 PM       I reviewed his chart, and would like to see him back in January fasting for 30 minute general recheck. ------

## 2013-08-28 NOTE — Telephone Encounter (Signed)
I left the patient a message letting him know he will need to schedule a OV. CLS

## 2013-09-01 NOTE — Progress Notes (Signed)
The plan with this patient is to prescribed hydrocodone to be used only with flareups. No more than 20 tablets per month Hydrocodone 5/325

## 2013-09-17 ENCOUNTER — Encounter: Payer: Medicare Other | Attending: Physical Medicine & Rehabilitation

## 2013-09-17 ENCOUNTER — Encounter: Payer: Self-pay | Admitting: Physical Medicine & Rehabilitation

## 2013-09-17 ENCOUNTER — Encounter (INDEPENDENT_AMBULATORY_CARE_PROVIDER_SITE_OTHER): Payer: Self-pay

## 2013-09-17 ENCOUNTER — Ambulatory Visit (HOSPITAL_BASED_OUTPATIENT_CLINIC_OR_DEPARTMENT_OTHER): Payer: Medicare Other | Admitting: Physical Medicine & Rehabilitation

## 2013-09-17 VITALS — BP 157/84 | HR 105 | Resp 16 | Ht 75.0 in | Wt 273.0 lb

## 2013-09-17 DIAGNOSIS — M47817 Spondylosis without myelopathy or radiculopathy, lumbosacral region: Secondary | ICD-10-CM | POA: Insufficient documentation

## 2013-09-17 DIAGNOSIS — M79609 Pain in unspecified limb: Secondary | ICD-10-CM | POA: Diagnosis not present

## 2013-09-17 DIAGNOSIS — M25569 Pain in unspecified knee: Secondary | ICD-10-CM | POA: Diagnosis not present

## 2013-09-17 DIAGNOSIS — M48061 Spinal stenosis, lumbar region without neurogenic claudication: Secondary | ICD-10-CM | POA: Diagnosis not present

## 2013-09-17 MED ORDER — HYDROCODONE-ACETAMINOPHEN 5-325 MG PO TABS: 1.0000 | ORAL_TABLET | Freq: Three times a day (TID) | ORAL | Status: DC | PRN

## 2013-09-17 NOTE — Progress Notes (Signed)
  PROCEDURE RECORD [facility]   Name: Eric Hayes DOB:01-08-1976 MRN: [id number]  Date:09/17/2013  Physician: Claudette Laws, MD    Nurse/CMA: Shumaker RN  Allergies:  Allergies  Allergen Reactions  . Penicillins Hives    Consent Signed: yes  Is patient diabetic? no  CBG today?   Pregnant: no LMP: No LMP for male patient. (age 37-55)  Anticoagulants: no Anti-inflammatory: no Antibiotics: no  Procedure: left L 3-4-5 medial branch blockPosition: Prone Start Time: 12:49 End Time:12:55  Fluoro Time: 25 seconds  RN/CMA Haematologist RN    Time 12:24 12:57    BP 157/84 175/79    Pulse 105 101    Respirations 16 16    O2 Sat 95 94    S/S 6 6    Pain Level 7/10 7/10     D/C home with Tresa Endo, patient A & O X 3, D/C instructions reviewed, and sits independently.

## 2013-09-17 NOTE — Patient Instructions (Signed)
Don't take any hydrocodone for the next 2 days so we can get an accurate idea of how much the injection helped

## 2013-09-17 NOTE — Progress Notes (Signed)
Left Lumbar L3, L4  medial branch blocks and L 5 dorsal ramus injection under fluoroscopic guidance   Indication: Left Lumbar pain which is not relieved by medication management or other conservative care and interfering with self-care and mobility.  Informed consent was obtained after describing risks and benefits of the procedure with the patient, this includes bleeding, bruising, infection, paralysis and medication side effects.  The patient wishes to proceed and has given written consent.  The patient was placed in a prone position.  The lumbar area was marked and prepped with Betadine.  One mL of 1% lidocaine was injected into each of 3 areas into the skin and subcutaneous tissue.  Then a 22-gauge 3.5 inch spinal needle was inserted targeting the junction of the left S1 superior articular process and sacral ala junction.  Needle was advanced under fluoroscopic guidance.  Bone contact was made.  Omnipaque 180 was injected x 0.5 mL demonstrating no intravascular uptake.  Then a solution containing one mL of 4 mg per mL dexamethasone and 3 mL of 2% MPF lidocaine was injected x 0.5 mL.  Then the left L5 superior articular process in transverse process junction was targeted.  Bone contact was made.  Omnipaque 180 was injected x 0.5 mL demonstrating no intravascular uptake.  Then a solution containing one mL of 4 mg per mL dexamethasone and 3 mL of 2% MPF lidocaine was injected x 0.5 mL.  Then the left L4 superior articular process in transverse process junction was targeted.  Bone contact was made.  Omnipaque 180 was injected x 0.5 mL demonstrating no intravascular uptake.  Then a solution containing one mL of 4 mg per mL dexamethasone and 3 mL of 2% MPF lidocaine was injected x 0.5 mL.  Patient tolerated procedure well.  Post procedure instructions were given.  Pre injection 7 Post injection 7

## 2013-10-06 ENCOUNTER — Ambulatory Visit: Payer: Medicare Other | Admitting: Physical Medicine & Rehabilitation

## 2013-10-19 ENCOUNTER — Ambulatory Visit (HOSPITAL_BASED_OUTPATIENT_CLINIC_OR_DEPARTMENT_OTHER): Payer: Medicare Other | Admitting: Physical Medicine & Rehabilitation

## 2013-10-19 ENCOUNTER — Encounter: Payer: Self-pay | Admitting: Physical Medicine & Rehabilitation

## 2013-10-19 ENCOUNTER — Encounter: Payer: Medicare Other | Attending: Physical Medicine & Rehabilitation

## 2013-10-19 VITALS — BP 163/87 | HR 95 | Resp 16 | Ht 75.0 in | Wt 273.0 lb

## 2013-10-19 DIAGNOSIS — M25569 Pain in unspecified knee: Secondary | ICD-10-CM | POA: Insufficient documentation

## 2013-10-19 DIAGNOSIS — M47817 Spondylosis without myelopathy or radiculopathy, lumbosacral region: Secondary | ICD-10-CM | POA: Insufficient documentation

## 2013-10-19 DIAGNOSIS — M549 Dorsalgia, unspecified: Secondary | ICD-10-CM | POA: Diagnosis not present

## 2013-10-19 DIAGNOSIS — G8929 Other chronic pain: Secondary | ICD-10-CM

## 2013-10-19 DIAGNOSIS — M48061 Spinal stenosis, lumbar region without neurogenic claudication: Secondary | ICD-10-CM | POA: Diagnosis not present

## 2013-10-19 DIAGNOSIS — M79609 Pain in unspecified limb: Secondary | ICD-10-CM | POA: Diagnosis not present

## 2013-10-19 MED ORDER — ACETAMINOPHEN-CODEINE #3 300-30 MG PO TABS: 1.0000 | ORAL_TABLET | Freq: Three times a day (TID) | ORAL | Status: DC | PRN
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
## 2013-10-19 NOTE — Patient Instructions (Signed)
Do not drink alcohol with this medication Call if it is not effective for reevaluation

## 2013-11-10 DIAGNOSIS — F410 Panic disorder [episodic paroxysmal anxiety] without agoraphobia: Secondary | ICD-10-CM | POA: Diagnosis not present

## 2013-11-10 DIAGNOSIS — F319 Bipolar disorder, unspecified: Secondary | ICD-10-CM | POA: Diagnosis not present

## 2013-11-10 DIAGNOSIS — F988 Other specified behavioral and emotional disorders with onset usually occurring in childhood and adolescence: Secondary | ICD-10-CM | POA: Diagnosis not present

## 2013-11-18 ENCOUNTER — Other Ambulatory Visit: Payer: Self-pay | Admitting: Physical Medicine & Rehabilitation

## 2013-11-18 MED ORDER — ACETAMINOPHEN-CODEINE #3 300-30 MG PO TABS: 1.0000 | ORAL_TABLET | Freq: Three times a day (TID) | ORAL | Status: DC | PRN

## 2013-12-18 ENCOUNTER — Other Ambulatory Visit: Payer: Self-pay | Admitting: Physical Medicine & Rehabilitation

## 2013-12-18 NOTE — Telephone Encounter (Signed)
Needs refill on tylenol # 3.  Refilled.Mr Brockel notified.

## 2013-12-28 ENCOUNTER — Ambulatory Visit: Payer: Medicare Other | Admitting: Medical

## 2014-01-18 ENCOUNTER — Ambulatory Visit: Payer: Medicare Other | Admitting: Physical Medicine & Rehabilitation

## 2014-01-18 ENCOUNTER — Other Ambulatory Visit: Payer: Self-pay | Admitting: Physical Medicine & Rehabilitation

## 2014-01-18 ENCOUNTER — Telehealth: Payer: Self-pay

## 2014-01-18 MED ORDER — ACETAMINOPHEN-CODEINE 300-30 MG PO TABS: ORAL_TABLET | ORAL | Status: DC

## 2014-01-18 NOTE — Telephone Encounter (Signed)
Patient is requesting a refill on Tylenol #3. Refill called in to [facility]. Patient is aware RX has been called in.

## 2014-01-20 ENCOUNTER — Telehealth: Payer: Self-pay

## 2014-01-20 ENCOUNTER — Ambulatory Visit: Payer: Medicare Other | Admitting: Medical

## 2014-01-20 ENCOUNTER — Encounter: Payer: Self-pay | Admitting: Medical

## 2014-01-20 ENCOUNTER — Ambulatory Visit (INDEPENDENT_AMBULATORY_CARE_PROVIDER_SITE_OTHER): Payer: Medicare Other | Admitting: Medical

## 2014-01-20 VITALS — BP 120/70 | HR 100 | Temp 98.0°F | Resp 18 | Wt 270.0 lb

## 2014-01-20 DIAGNOSIS — J309 Allergic rhinitis, unspecified: Secondary | ICD-10-CM | POA: Insufficient documentation

## 2014-01-20 DIAGNOSIS — J449 Chronic obstructive pulmonary disease, unspecified: Secondary | ICD-10-CM

## 2014-01-20 DIAGNOSIS — J4489 Other specified chronic obstructive pulmonary disease: Secondary | ICD-10-CM

## 2014-01-20 DIAGNOSIS — E119 Type 2 diabetes mellitus without complications: Secondary | ICD-10-CM

## 2014-01-20 DIAGNOSIS — Z5181 Encounter for therapeutic drug level monitoring: Secondary | ICD-10-CM

## 2014-01-20 DIAGNOSIS — G4733 Obstructive sleep apnea (adult) (pediatric): Secondary | ICD-10-CM

## 2014-01-20 DIAGNOSIS — I10 Essential (primary) hypertension: Secondary | ICD-10-CM

## 2014-01-20 DIAGNOSIS — E785 Hyperlipidemia, unspecified: Secondary | ICD-10-CM

## 2014-01-20 DIAGNOSIS — F17200 Nicotine dependence, unspecified, uncomplicated: Secondary | ICD-10-CM

## 2014-01-20 MED ORDER — MONTELUKAST SODIUM 10 MG PO TABS: 10.0000 mg | ORAL_TABLET | Freq: Every day | ORAL | Status: DC

## 2014-01-20 MED ORDER — AZELASTINE-FLUTICASONE 137-50 MCG/ACT NA SUSP: 1.0000 | Freq: Two times a day (BID) | NASAL | Status: DC

## 2014-01-20 MED ORDER — AZELASTINE-FLUTICASONE 137-50 MCG/ACT NA SUSP: 2.0000 | Freq: Two times a day (BID) | NASAL | Status: DC

## 2014-01-20 NOTE — Assessment & Plan Note (Signed)
Still smoking regularly, no desire to quit at this time

## 2014-01-20 NOTE — Assessment & Plan Note (Signed)
Diet controlled currently.  nonfasting today.  Last medication 2014.

## 2014-01-20 NOTE — Progress Notes (Signed)
Subjective:  Eric Hayes is a 38 y.o. male who presents for staying congested x 2+ weeks.  Symptoms include nasal congestion, sinus pressure, itchy and watery eyes, sneezing.  Denies fever, ear pain, sore throat, NVD, no teeth pain, cough.  Patient is a smoker.  Using OTC allergy pill for symptoms.  Denies sick contacts.  No other aggravating or relieving factors.   Has seen ENT Dr. Redmond Pulling in [place], [place] prior. Had allergy testing, allergy shots, balloon nasal procedure, but no improvign.  Because of the nasal congestion chronic, can't use his CPAP.     Type II or unspecified type diabetes mellitus without mention of complication, not stated as uncontrolled Been diet controlled since last year after losing weight, changing diet.  Still checks glucose regularly seeing good numbers.    Hyperlipidemia Diet controlled currently.  nonfasting today.  Last medication 2014.  Essential hypertension, benign Currently controlled without medication  Tobacco use disorder Still smoking regularly, no desire to quit at this time   No other c/o.  ROS as in subjective   Objective: Filed Vitals:   01/20/14 1149  BP: 120/70  Pulse: 100  Temp: 98 F (36.7 C)  Resp: 18    General appearance: Alert, WD/WN, no distress                             Skin: warm, no rash                           Head: +frontal sinus tenderness                            Eyes: conjunctiva with mild erythema/injected, corneas clear, PERRLA                            Ears: pearly TMs, external ear canals normal                          Nose: septum midline, turbinates swollen, clear discharge             Mouth/throat: MMM, tongue normal, mild pharyngeal erythema                           Neck: supple, no adenopathy, no thyromegaly, nontender                          Heart: RRR, normal S1, S2, no murmurs                         Lungs: CTA bilaterally, no wheezes, rales, or rhonchi      Assessment and Plan: Encounter  Diagnoses  Name Primary?  . Allergic rhinitis Yes  . Chronic airway obstruction, not elsewhere classified   . OSA (obstructive sleep apnea)   . Type II or unspecified type diabetes mellitus without mention of complication, not stated as uncontrolled   . Hyperlipidemia   . Essential hypertension, benign   . Tobacco use disorder   . Medication monitoring encounter    Allergic rhintiis, airway (nasopharynx) obstruction, likely adenoids and tonsils given his chronic nasal sounding voice, OSA unable to use CPAP.  Referral at his request to Dr. Benjamine Mola ENT  here locally for another opinion.  Will request Dr. Dois Davenport records as well.  For now, begin Dymista nasal spray, Singulair table, OTC allegra QHS.  Diabetes, Hyperlipidemia, HTN - been diet controlled since last year.   Return this week for routine fasting labs.  Tobacco - still smoking,not interested in quitting.   Medication monitoring - sees psychiatry, will get carbamazepine lab whne he comes for labs this week  F/u for labs, referral

## 2014-01-20 NOTE — Telephone Encounter (Signed)
Tylenol #3 refill called into [facility] on 2/16. Patient is aware.

## 2014-01-20 NOTE — Assessment & Plan Note (Signed)
Currently controlled without medication

## 2014-01-20 NOTE — Assessment & Plan Note (Signed)
Been diet controlled since last year after losing weight, changing diet.  Still checks glucose regularly seeing good numbers.

## 2014-01-20 NOTE — Telephone Encounter (Signed)
Patient called requesting tylenol #3 refill.  He would like a call back.

## 2014-01-22 ENCOUNTER — Other Ambulatory Visit: Payer: Medicare Other

## 2014-01-22 DIAGNOSIS — Z5181 Encounter for therapeutic drug level monitoring: Secondary | ICD-10-CM | POA: Diagnosis not present

## 2014-01-22 DIAGNOSIS — E785 Hyperlipidemia, unspecified: Secondary | ICD-10-CM

## 2014-01-22 DIAGNOSIS — E119 Type 2 diabetes mellitus without complications: Secondary | ICD-10-CM

## 2014-01-22 DIAGNOSIS — I10 Essential (primary) hypertension: Secondary | ICD-10-CM

## 2014-01-22 DIAGNOSIS — F410 Panic disorder [episodic paroxysmal anxiety] without agoraphobia: Secondary | ICD-10-CM | POA: Diagnosis not present

## 2014-01-22 DIAGNOSIS — F909 Attention-deficit hyperactivity disorder, unspecified type: Secondary | ICD-10-CM | POA: Diagnosis not present

## 2014-01-22 DIAGNOSIS — F319 Bipolar disorder, unspecified: Secondary | ICD-10-CM | POA: Diagnosis not present

## 2014-01-22 DIAGNOSIS — Z79899 Other long term (current) drug therapy: Secondary | ICD-10-CM | POA: Diagnosis not present

## 2014-01-22 LAB — COMPREHENSIVE METABOLIC PANEL
ALBUMIN: 4.1 g/dL (ref 3.5–5.2)
ALT: 24 U/L (ref 0–53)
AST: 20 U/L (ref 0–37)
Alkaline Phosphatase: 76 U/L (ref 39–117)
BUN: 5 mg/dL — AB (ref 6–23)
CO2: 29 mEq/L (ref 19–32)
Calcium: 9.1 mg/dL (ref 8.4–10.5)
Chloride: 103 mEq/L (ref 96–112)
Creat: 0.66 mg/dL (ref 0.50–1.35)
Glucose, Bld: 123 mg/dL — ABNORMAL HIGH (ref 70–99)
Potassium: 4.4 mEq/L (ref 3.5–5.3)
Sodium: 139 mEq/L (ref 135–145)
Total Bilirubin: 0.3 mg/dL (ref 0.2–1.2)
Total Protein: 6.8 g/dL (ref 6.0–8.3)

## 2014-01-22 LAB — LIPID PANEL
CHOLESTEROL: 180 mg/dL (ref 0–200)
HDL: 44 mg/dL (ref >39)
LDL Cholesterol: 112 mg/dL — ABNORMAL HIGH (ref 0–99)
Total CHOL/HDL Ratio: 4.1 Ratio
Triglycerides: 122 mg/dL (ref <150)
VLDL: 24 mg/dL (ref 0–40)

## 2014-01-22 LAB — HEMOGLOBIN A1C
Hgb A1c MFr Bld: 5.5 % (ref <5.7)
Mean Plasma Glucose: 111 mg/dL (ref <117)

## 2014-01-23 LAB — MICROALBUMIN / CREATININE URINE RATIO
Creatinine, Urine: 84 mg/dL
MICROALB UR: 0.5 mg/dL (ref 0.00–1.89)
MICROALB/CREAT RATIO: 6 mg/g (ref 0.0–30.0)

## 2014-01-23 LAB — CARBAMAZEPINE LEVEL, TOTAL: Carbamazepine Lvl: 5.8 ug/mL (ref 4.0–12.0)

## 2014-01-25 ENCOUNTER — Other Ambulatory Visit: Payer: Self-pay | Admitting: Medical

## 2014-01-25 ENCOUNTER — Telehealth: Payer: Self-pay | Admitting: Family Medicine

## 2014-01-25 MED ORDER — ATORVASTATIN CALCIUM 20 MG PO TABS: 20.0000 mg | ORAL_TABLET | Freq: Every day | ORAL | Status: DC

## 2014-01-25 NOTE — Telephone Encounter (Signed)
PATIENT IS AWARE OF HIS APPOINTMENT TO SEE DR. Benjamine Mola. CLS ON 01/26/14 @ 130 PM [place]. [place], [place] [address] [phone]

## 2014-02-12 ENCOUNTER — Ambulatory Visit: Payer: Medicare Other | Admitting: Physical Medicine & Rehabilitation

## 2014-02-17 ENCOUNTER — Other Ambulatory Visit: Payer: Self-pay

## 2014-02-17 ENCOUNTER — Telehealth: Payer: Self-pay | Admitting: Medical

## 2014-02-17 ENCOUNTER — Other Ambulatory Visit (INDEPENDENT_AMBULATORY_CARE_PROVIDER_SITE_OTHER): Payer: Self-pay | Admitting: Otolaryngology

## 2014-02-17 ENCOUNTER — Other Ambulatory Visit: Payer: Self-pay | Admitting: Medical

## 2014-02-17 DIAGNOSIS — J343 Hypertrophy of nasal turbinates: Secondary | ICD-10-CM | POA: Diagnosis not present

## 2014-02-17 DIAGNOSIS — J342 Deviated nasal septum: Secondary | ICD-10-CM | POA: Diagnosis not present

## 2014-02-17 DIAGNOSIS — J330 Polyp of nasal cavity: Secondary | ICD-10-CM | POA: Diagnosis not present

## 2014-02-17 DIAGNOSIS — J329 Chronic sinusitis, unspecified: Secondary | ICD-10-CM

## 2014-02-17 MED ORDER — ACETAMINOPHEN-CODEINE 300-30 MG PO TABS: ORAL_TABLET | ORAL | Status: DC

## 2014-02-17 NOTE — Telephone Encounter (Signed)
Tylenol #3 refill called in at pharmacy. Patient must keep appt. on 4/24.

## 2014-02-17 NOTE — Telephone Encounter (Signed)
Patient saw ENT in [place] today that we referred him to  He would like [place] to send records from old ENT in [place], Dr Redmond Pulling sent to [facility]/[place]

## 2014-02-17 NOTE — Telephone Encounter (Signed)
I gave him Dymista samples last time . Which worked better, which does he want flonase or Dymista combo spray?

## 2014-02-17 NOTE — Telephone Encounter (Signed)
Pharmacy got mixed up on meds. Pt is on dymista and likes it and picked up rx yesterday.

## 2014-02-17 NOTE — Telephone Encounter (Signed)
Fax refill request received from [facility]  Fluticasone prop 50 mcg 16 gm Inhale 2 puffs in each nostril daily Last dispensed  09/16/12

## 2014-02-19 NOTE — Telephone Encounter (Signed)
Eric Hayes, Can we do this?

## 2014-02-22 NOTE — Telephone Encounter (Signed)
shane told me that he saw him up in [place] when he was working up there and he sent him then to [facility] and then they transferred to his care down here and wants to be referred to a ENT so i am assuming we can do that seen her referred him to [place] and then now wants to see someone i [place].

## 2014-02-22 NOTE — Telephone Encounter (Signed)
I fax over the one office note we had in his chart to Dr. Deeann Saint office. CLS

## 2014-02-23 ENCOUNTER — Ambulatory Visit (HOSPITAL_COMMUNITY)
Admission: RE | Admit: 2014-02-23 | Discharge: 2014-02-23 | Disposition: A | Discharge status: Home / Self Care | Payer: Medicare Other | Source: Ambulatory Visit | Attending: Otolaryngology | Admitting: Otolaryngology

## 2014-02-23 DIAGNOSIS — K859 Acute pancreatitis without necrosis or infection, unspecified: Secondary | ICD-10-CM | POA: Diagnosis not present

## 2014-02-23 DIAGNOSIS — J328 Other chronic sinusitis: Secondary | ICD-10-CM | POA: Diagnosis not present

## 2014-02-23 DIAGNOSIS — J329 Chronic sinusitis, unspecified: Secondary | ICD-10-CM

## 2014-02-26 ENCOUNTER — Telehealth: Payer: Self-pay | Admitting: Internal Medicine

## 2014-02-26 NOTE — Telephone Encounter (Signed)
Called pt to find out if he requested from [employer] a knee brace and a CPAP Mask. Pt was told to call back and let me know

## 2014-02-26 NOTE — Telephone Encounter (Signed)
Pt DOES USE [employer] for CPAP mask and Knee Brace

## 2014-03-03 DIAGNOSIS — J320 Chronic maxillary sinusitis: Secondary | ICD-10-CM | POA: Diagnosis not present

## 2014-03-03 DIAGNOSIS — J323 Chronic sphenoidal sinusitis: Secondary | ICD-10-CM | POA: Diagnosis not present

## 2014-03-03 DIAGNOSIS — J322 Chronic ethmoidal sinusitis: Secondary | ICD-10-CM | POA: Diagnosis not present

## 2014-03-03 DIAGNOSIS — J321 Chronic frontal sinusitis: Secondary | ICD-10-CM | POA: Diagnosis not present

## 2014-03-09 ENCOUNTER — Other Ambulatory Visit: Payer: Self-pay | Admitting: Otolaryngology

## 2014-03-10 ENCOUNTER — Encounter (HOSPITAL_BASED_OUTPATIENT_CLINIC_OR_DEPARTMENT_OTHER): Payer: Self-pay | Admitting: *Deleted

## 2014-03-11 ENCOUNTER — Encounter (HOSPITAL_BASED_OUTPATIENT_CLINIC_OR_DEPARTMENT_OTHER): Payer: Self-pay | Admitting: *Deleted

## 2014-03-11 NOTE — Pre-Procedure Instructions (Addendum)
History discussed with Dr. Al Corpus; pt. OK to come for surgery. Will evaluate airway DOS.

## 2014-03-16 ENCOUNTER — Ambulatory Visit (HOSPITAL_BASED_OUTPATIENT_CLINIC_OR_DEPARTMENT_OTHER)
Admission: RE | Admit: 2014-03-16 | Discharge: 2014-03-16 | Disposition: A | Discharge status: Home / Self Care | Payer: Medicare Other | Source: Ambulatory Visit | Attending: Otolaryngology | Admitting: Otolaryngology

## 2014-03-16 ENCOUNTER — Ambulatory Visit (HOSPITAL_BASED_OUTPATIENT_CLINIC_OR_DEPARTMENT_OTHER): Payer: Medicare Other | Admitting: Anesthesiology

## 2014-03-16 ENCOUNTER — Encounter (HOSPITAL_BASED_OUTPATIENT_CLINIC_OR_DEPARTMENT_OTHER): Payer: Self-pay | Admitting: *Deleted

## 2014-03-16 ENCOUNTER — Encounter (HOSPITAL_BASED_OUTPATIENT_CLINIC_OR_DEPARTMENT_OTHER)
Admission: RE | Disposition: A | Discharge status: Home / Self Care | Payer: Self-pay | Source: Ambulatory Visit | Attending: Otolaryngology

## 2014-03-16 ENCOUNTER — Encounter (HOSPITAL_BASED_OUTPATIENT_CLINIC_OR_DEPARTMENT_OTHER): Payer: Medicare Other | Admitting: Anesthesiology

## 2014-03-16 ENCOUNTER — Telehealth: Payer: Self-pay

## 2014-03-16 DIAGNOSIS — J322 Chronic ethmoidal sinusitis: Secondary | ICD-10-CM | POA: Insufficient documentation

## 2014-03-16 DIAGNOSIS — F329 Major depressive disorder, single episode, unspecified: Secondary | ICD-10-CM | POA: Insufficient documentation

## 2014-03-16 DIAGNOSIS — J342 Deviated nasal septum: Secondary | ICD-10-CM | POA: Insufficient documentation

## 2014-03-16 DIAGNOSIS — I10 Essential (primary) hypertension: Secondary | ICD-10-CM | POA: Diagnosis not present

## 2014-03-16 DIAGNOSIS — J321 Chronic frontal sinusitis: Secondary | ICD-10-CM | POA: Diagnosis not present

## 2014-03-16 DIAGNOSIS — M810 Age-related osteoporosis without current pathological fracture: Secondary | ICD-10-CM | POA: Diagnosis not present

## 2014-03-16 DIAGNOSIS — M4800 Spinal stenosis, site unspecified: Secondary | ICD-10-CM | POA: Insufficient documentation

## 2014-03-16 DIAGNOSIS — F3289 Other specified depressive episodes: Secondary | ICD-10-CM | POA: Diagnosis not present

## 2014-03-16 DIAGNOSIS — G4733 Obstructive sleep apnea (adult) (pediatric): Secondary | ICD-10-CM | POA: Insufficient documentation

## 2014-03-16 DIAGNOSIS — J338 Other polyp of sinus: Secondary | ICD-10-CM | POA: Insufficient documentation

## 2014-03-16 DIAGNOSIS — Z9889 Other specified postprocedural states: Secondary | ICD-10-CM

## 2014-03-16 DIAGNOSIS — J320 Chronic maxillary sinusitis: Secondary | ICD-10-CM | POA: Diagnosis not present

## 2014-03-16 DIAGNOSIS — F17200 Nicotine dependence, unspecified, uncomplicated: Secondary | ICD-10-CM | POA: Insufficient documentation

## 2014-03-16 DIAGNOSIS — J352 Hypertrophy of adenoids: Secondary | ICD-10-CM | POA: Insufficient documentation

## 2014-03-16 DIAGNOSIS — J323 Chronic sphenoidal sinusitis: Secondary | ICD-10-CM | POA: Diagnosis not present

## 2014-03-16 DIAGNOSIS — J343 Hypertrophy of nasal turbinates: Secondary | ICD-10-CM | POA: Insufficient documentation

## 2014-03-16 DIAGNOSIS — Z880 Allergy status to penicillin: Secondary | ICD-10-CM | POA: Diagnosis not present

## 2014-03-16 DIAGNOSIS — F411 Generalized anxiety disorder: Secondary | ICD-10-CM | POA: Insufficient documentation

## 2014-03-16 DIAGNOSIS — J3489 Other specified disorders of nose and nasal sinuses: Secondary | ICD-10-CM | POA: Diagnosis not present

## 2014-03-16 DIAGNOSIS — J329 Chronic sinusitis, unspecified: Secondary | ICD-10-CM | POA: Diagnosis not present

## 2014-03-16 HISTORY — DX: Personal history of other venous thrombosis and embolism: Z86.718

## 2014-03-16 HISTORY — PX: ADENOIDECTOMY: SHX5191

## 2014-03-16 HISTORY — PX: SINUS ENDO W/FUSION: SHX777

## 2014-03-16 HISTORY — DX: Type 2 diabetes mellitus without complications: E11.9

## 2014-03-16 HISTORY — DX: Personal history of Methicillin resistant Staphylococcus aureus infection: Z86.14

## 2014-03-16 HISTORY — DX: Unspecified osteoarthritis, unspecified site: M19.90

## 2014-03-16 HISTORY — PX: TURBINATE REDUCTION: SHX6157

## 2014-03-16 LAB — HIV ANTIBODY (ROUTINE TESTING W REFLEX): HIV: NONREACTIVE

## 2014-03-16 LAB — POCT HEMOGLOBIN-HEMACUE: Hemoglobin: 15.4 g/dL (ref 13.0–17.0)

## 2014-03-16 SURGERY — REDUCTION, NASAL TURBINATE
Anesthesia: General | Laterality: Bilateral

## 2014-03-16 MED ORDER — DEXAMETHASONE SODIUM PHOSPHATE 4 MG/ML IJ SOLN
INTRAMUSCULAR | Status: DC | PRN
  Administered 2014-03-16: 10 mg via INTRAVENOUS

## 2014-03-16 MED ORDER — HYDROMORPHONE HCL PF 1 MG/ML IJ SOLN
INTRAMUSCULAR | Status: AC
  Filled 2014-03-16: qty 1

## 2014-03-16 MED ORDER — LACTATED RINGERS IV SOLN
INTRAVENOUS | Status: DC
  Administered 2014-03-16 (×3): via INTRAVENOUS

## 2014-03-16 MED ORDER — MIDAZOLAM HCL 2 MG/2ML IJ SOLN
INTRAMUSCULAR | Status: AC
  Filled 2014-03-16: qty 2

## 2014-03-16 MED ORDER — FENTANYL CITRATE 0.05 MG/ML IJ SOLN
INTRAMUSCULAR | Status: AC
  Filled 2014-03-16: qty 6

## 2014-03-16 MED ORDER — SUCCINYLCHOLINE CHLORIDE 20 MG/ML IJ SOLN
INTRAMUSCULAR | Status: DC | PRN
  Administered 2014-03-16: 120 mg via INTRAVENOUS

## 2014-03-16 MED ORDER — HYDROMORPHONE HCL PF 1 MG/ML IJ SOLN
0.2500 mg | INTRAMUSCULAR | Status: DC | PRN
  Administered 2014-03-16 (×4): 0.5 mg via INTRAVENOUS

## 2014-03-16 MED ORDER — FENTANYL CITRATE 0.05 MG/ML IJ SOLN
INTRAMUSCULAR | Status: AC
  Filled 2014-03-16: qty 4

## 2014-03-16 MED ORDER — ONDANSETRON HCL 4 MG/2ML IJ SOLN: 4.0000 mg | Freq: Four times a day (QID) | INTRAMUSCULAR | Status: DC | PRN

## 2014-03-16 MED ORDER — OXYMETAZOLINE HCL 0.05 % NA SOLN
NASAL | Status: DC | PRN
  Administered 2014-03-16: 1 via NASAL

## 2014-03-16 MED ORDER — OXYMETAZOLINE HCL 0.05 % NA SOLN
NASAL | Status: AC
  Filled 2014-03-16: qty 15

## 2014-03-16 MED ORDER — FENTANYL CITRATE 0.05 MG/ML IJ SOLN
INTRAMUSCULAR | Status: DC | PRN
  Administered 2014-03-16 (×2): 50 ug via INTRAVENOUS
  Administered 2014-03-16 (×2): 100 ug via INTRAVENOUS

## 2014-03-16 MED ORDER — MUPIROCIN 2 % EX OINT
TOPICAL_OINTMENT | CUTANEOUS | Status: DC | PRN
  Administered 2014-03-16: 1 via NASAL

## 2014-03-16 MED ORDER — LIDOCAINE HCL (CARDIAC) 20 MG/ML IV SOLN
INTRAVENOUS | Status: DC | PRN
  Administered 2014-03-16: 80 mg via INTRAVENOUS

## 2014-03-16 MED ORDER — OXYCODONE HCL 5 MG PO TABS
5.0000 mg | ORAL_TABLET | Freq: Once | ORAL | Status: AC | PRN
  Administered 2014-03-16: 5 mg via ORAL

## 2014-03-16 MED ORDER — MIDAZOLAM HCL 2 MG/2ML IJ SOLN: 1.0000 mg | INTRAMUSCULAR | Status: DC | PRN

## 2014-03-16 MED ORDER — CLINDAMYCIN PHOSPHATE 600 MG/50ML IV SOLN
INTRAVENOUS | Status: DC | PRN
  Administered 2014-03-16: 600 mg via INTRAVENOUS

## 2014-03-16 MED ORDER — PROPOFOL 10 MG/ML IV BOLUS
INTRAVENOUS | Status: DC | PRN
  Administered 2014-03-16: 230 mg via INTRAVENOUS

## 2014-03-16 MED ORDER — MIDAZOLAM HCL 2 MG/ML PO SYRP: 12.0000 mg | ORAL_SOLUTION | Freq: Once | ORAL | Status: DC | PRN

## 2014-03-16 MED ORDER — OXYCODONE HCL 5 MG PO TABS
ORAL_TABLET | ORAL | Status: AC
  Filled 2014-03-16: qty 1

## 2014-03-16 MED ORDER — OXYCODONE HCL 5 MG/5ML PO SOLN: 5.0000 mg | Freq: Once | ORAL | Status: AC | PRN

## 2014-03-16 MED ORDER — FENTANYL CITRATE 0.05 MG/ML IJ SOLN: 50.0000 ug | INTRAMUSCULAR | Status: DC | PRN

## 2014-03-16 MED ORDER — COCAINE HCL 4 % EX SOLN
CUTANEOUS | Status: AC
  Filled 2014-03-16: qty 8

## 2014-03-16 MED ORDER — MIDAZOLAM HCL 5 MG/5ML IJ SOLN
INTRAMUSCULAR | Status: DC | PRN
  Administered 2014-03-16: 2 mg via INTRAVENOUS

## 2014-03-16 MED ORDER — ONDANSETRON HCL 4 MG/2ML IJ SOLN
INTRAMUSCULAR | Status: DC | PRN
  Administered 2014-03-16: 4 mg via INTRAVENOUS

## 2014-03-16 MED ORDER — PROPOFOL 10 MG/ML IV BOLUS
INTRAVENOUS | Status: AC
  Filled 2014-03-16: qty 20

## 2014-03-16 MED ORDER — OXYCODONE-ACETAMINOPHEN 5-325 MG PO TABS: 1.0000 | ORAL_TABLET | Freq: Four times a day (QID) | ORAL | Status: DC | PRN

## 2014-03-16 MED ORDER — CLINDAMYCIN HCL 300 MG PO CAPS: 300.0000 mg | ORAL_CAPSULE | Freq: Three times a day (TID) | ORAL | Status: DC

## 2014-03-16 MED ORDER — COCAINE HCL 4 % EX SOLN
CUTANEOUS | Status: DC | PRN
  Administered 2014-03-16: 4 mL via NASAL

## 2014-03-16 MED ORDER — CLINDAMYCIN PHOSPHATE 600 MG/50ML IV SOLN
INTRAVENOUS | Status: AC
  Filled 2014-03-16: qty 50

## 2014-03-16 SURGICAL SUPPLY — 76 items
ATTRACTOMAT 16X20 MAGNETIC DRP (DRAPES) IMPLANT
BLADE ROTATE RAD 12 4 M4 (BLADE) IMPLANT
BLADE ROTATE RAD 12 4MM M4 (BLADE)
BLADE ROTATE RAD 40 4 M4 (BLADE) ×2 IMPLANT
BLADE ROTATE RAD 40 4MM M4 (BLADE) ×1
BLADE ROTATE TRICUT 4MX13CM M4 (BLADE) ×1
BLADE ROTATE TRICUT 4X13 M4 (BLADE) ×2 IMPLANT
BLADE TRICUT ROTATE M4 4 5PK (BLADE) IMPLANT
BLADE TRICUT ROTATE M4 4MM 5PK (BLADE)
BUR HS RAD FRONTAL 3 (BURR) IMPLANT
BUR HS RAD FRONTAL 3MM (BURR)
CANISTER SUC SOCK COL 7IN (MISCELLANEOUS) ×6 IMPLANT
CANISTER SUCT 1200ML W/VALVE (MISCELLANEOUS) ×3 IMPLANT
CATH ROBINSON RED A/P 10FR (CATHETERS) IMPLANT
CATH ROBINSON RED A/P 14FR (CATHETERS) IMPLANT
COAGULATOR SUCT 6 FR SWTCH (ELECTROSURGICAL)
COAGULATOR SUCT 8FR VV (MISCELLANEOUS) ×3 IMPLANT
COAGULATOR SUCT SWTCH 10FR 6 (ELECTROSURGICAL) IMPLANT
COVER MAYO STAND STRL (DRAPES) ×3 IMPLANT
DECANTER SPIKE VIAL GLASS SM (MISCELLANEOUS) IMPLANT
DRAPE SURG 17X23 STRL (DRAPES) ×3 IMPLANT
DRSG NASAL KENNEDY LMNT 8CM (GAUZE/BANDAGES/DRESSINGS) IMPLANT
DRSG NASOPORE 8CM (GAUZE/BANDAGES/DRESSINGS) ×3 IMPLANT
DRSG TELFA 3X8 NADH (GAUZE/BANDAGES/DRESSINGS) IMPLANT
ELECT REM PT RETURN 9FT ADLT (ELECTROSURGICAL) ×3
ELECT REM PT RETURN 9FT PED (ELECTROSURGICAL)
ELECTRODE REM PT RETRN 9FT PED (ELECTROSURGICAL) IMPLANT
ELECTRODE REM PT RTRN 9FT ADLT (ELECTROSURGICAL) ×1 IMPLANT
GLOVE BIO SURGEON STRL SZ7.5 (GLOVE) ×6 IMPLANT
GLOVE BIOGEL PI IND STRL 7.5 (GLOVE) ×1 IMPLANT
GLOVE BIOGEL PI INDICATOR 7.5 (GLOVE) ×2
GLOVE SURG SS PI 7.0 STRL IVOR (GLOVE) ×6 IMPLANT
GOWN PREVENTION PLUS XLARGE (GOWN DISPOSABLE) ×3 IMPLANT
GOWN STRL REUS W/ TWL LRG LVL3 (GOWN DISPOSABLE) ×2 IMPLANT
GOWN STRL REUS W/TWL LRG LVL3 (GOWN DISPOSABLE) ×4
HEMOSTAT SURGICEL 2X14 (HEMOSTASIS) IMPLANT
IMPL PROPEL SINUS 23MML (Prosthesis and Implant ENT) ×2 IMPLANT
IMPLANT PROPEL SINUS 23MML (Prosthesis and Implant ENT) ×6 IMPLANT
IV NS 1000ML (IV SOLUTION)
IV NS 1000ML BAXH (IV SOLUTION) IMPLANT
IV NS 500ML (IV SOLUTION) ×2
IV NS 500ML BAXH (IV SOLUTION) ×1 IMPLANT
MARKER SKIN DUAL TIP RULER LAB (MISCELLANEOUS) IMPLANT
NEEDLE 27GAX1X1/2 (NEEDLE) ×3 IMPLANT
NEEDLE HYPO 25X1 1.5 SAFETY (NEEDLE) IMPLANT
NEEDLE SPNL 25GX3.5 QUINCKE BL (NEEDLE) IMPLANT
NS IRRIG 1000ML POUR BTL (IV SOLUTION) ×3 IMPLANT
PACK BASIN DAY SURGERY FS (CUSTOM PROCEDURE TRAY) ×3 IMPLANT
PACK ENT DAY SURGERY (CUSTOM PROCEDURE TRAY) ×3 IMPLANT
PAD ENT ADHESIVE 25PK (MISCELLANEOUS) ×3 IMPLANT
PATTIES SURGICAL .5 X3 (DISPOSABLE) ×3 IMPLANT
SHEATH ENDOSCRUB 0 DEG (SHEATH) IMPLANT
SHEATH ENDOSCRUB 30 DEG (SHEATH) IMPLANT
SHEET MEDIUM DRAPE 40X70 STRL (DRAPES) ×3 IMPLANT
SLEEVE SCD COMPRESS KNEE MED (MISCELLANEOUS) ×3 IMPLANT
SOLUTION BUTLER CLEAR DIP (MISCELLANEOUS) ×3 IMPLANT
SPLINT NASAL DOYLE BI-VL (GAUZE/BANDAGES/DRESSINGS) IMPLANT
SPONGE GAUZE 2X2 8PLY STER LF (GAUZE/BANDAGES/DRESSINGS) ×1
SPONGE GAUZE 2X2 8PLY STRL LF (GAUZE/BANDAGES/DRESSINGS) ×2 IMPLANT
SPONGE GAUZE 4X4 12PLY STER LF (GAUZE/BANDAGES/DRESSINGS) ×3 IMPLANT
SPONGE NEURO XRAY DETECT 1X3 (DISPOSABLE) ×3 IMPLANT
SPONGE TONSIL 1 RF SGL (DISPOSABLE) IMPLANT
SPONGE TONSIL 1.25 RF SGL STRG (GAUZE/BANDAGES/DRESSINGS) IMPLANT
SUT ETHILON 3 0 PS 1 (SUTURE) IMPLANT
SUT PLAIN 4 0 ~~LOC~~ 1 (SUTURE) IMPLANT
SYR 3ML 23GX1 SAFETY (SYRINGE) IMPLANT
SYR BULB 3OZ (MISCELLANEOUS) IMPLANT
TOWEL OR 17X24 6PK STRL BLUE (TOWEL DISPOSABLE) ×6 IMPLANT
TRACKER ENT INSTRUMENT (MISCELLANEOUS) ×3 IMPLANT
TRACKER ENT PATIENT (MISCELLANEOUS) ×3 IMPLANT
TUBE CONNECTING 20'X1/4 (TUBING) ×1
TUBE CONNECTING 20X1/4 (TUBING) ×2 IMPLANT
TUBE SALEM SUMP 12R W/ARV (TUBING) IMPLANT
TUBE SALEM SUMP 16 FR W/ARV (TUBING) ×3 IMPLANT
TUBING STRAIGHTSHOT EPS 5PK (TUBING) ×3 IMPLANT
YANKAUER SUCT BULB TIP NO VENT (SUCTIONS) IMPLANT

## 2014-03-16 NOTE — Anesthesia Postprocedure Evaluation (Signed)
Anesthesia Post Note  Patient: Eric Hayes  Procedure(s) Performed: Procedure(s) (LRB): BILATERAL TURBINATE REDUCTION (Bilateral) BILATERAL ENDOSCOPIC TOTAL ETHMOIDECTOMY, BILATERAL MAXILLARY ANTROSTOMY, BILATERAL FRONTAL RECESS EXPLORATION AND BILATERAL SPHENOIDECTOMY WITH FUSION NAVIGATION (Bilateral)  ADENOIDECTOMY (Bilateral)  Anesthesia type: General  Patient location: PACU  Post pain: Pain level controlled and Adequate analgesia  Post assessment: Post-op Vital signs reviewed, Patient's Cardiovascular Status Stable, Respiratory Function Stable, Patent Airway and Pain level controlled  Last Vitals:  Filed Vitals:   03/16/14 1230  BP: 101/63  Pulse: 89  Temp:   Resp: 14    Post vital signs: Reviewed and stable  Level of consciousness: awake, alert  and oriented  Complications: No apparent anesthesia complications

## 2014-03-16 NOTE — Anesthesia Preprocedure Evaluation (Signed)
Anesthesia Evaluation  Patient identified by MRN, date of birth, ID band Patient awake    Reviewed: Allergy & Precautions, H&P , NPO status , Patient's Chart, lab work & pertinent test results  Airway Mallampati: II  Neck ROM: full    Dental   Pulmonary sleep apnea , Current Smoker,          Cardiovascular hypertension,     Neuro/Psych Anxiety Depression    GI/Hepatic   Endo/Other  diabetes, Type 2obese  Renal/GU      Musculoskeletal  (+) Arthritis -, Osteoarthritis,    Abdominal   Peds  Hematology   Anesthesia Other Findings   Reproductive/Obstetrics                           Anesthesia Physical Anesthesia Plan  ASA: II  Anesthesia Plan: General   Post-op Pain Management:    Induction: Intravenous  Airway Management Planned: Oral ETT  Additional Equipment:   Intra-op Plan:   Post-operative Plan: Extubation in OR  Informed Consent: I have reviewed the patients History and Physical, chart, labs and discussed the procedure including the risks, benefits and alternatives for the proposed anesthesia with the patient or authorized representative who has indicated his/her understanding and acceptance.     Plan Discussed with: CRNA, Anesthesiologist and Surgeon  Anesthesia Plan Comments:         Anesthesia Quick Evaluation

## 2014-03-16 NOTE — Brief Op Note (Signed)
03/16/2014  11:49 AM  PATIENT:  Eric Hayes  38 y.o. male  PRE-OPERATIVE DIAGNOSIS:   1) TURBINATE HYPERTROPHY 2) BILATERAL PANSINUSITIS AND POLYPOSIS 3) CHRONIC NASAL OBSTUCTION 4) ADENOID HYPERTROPHY  POST-OPERATIVE DIAGNOSIS:   1) TURBINATE HYPERTROPHY 2) BILATERAL PANSINUSITIS AND POLYPOSIS 3) CHRONIC NASAL OBSTUCTION 4) ADENOID HYPERTROPHY  PROCEDURE:   1) Bilateral endoscopic frontal sinus exploration with polyp removal 2) Bilateral endoscopic total ethmoidectomy 3) Bilateral endoscopic maxillary antrostomy with polyp removal 4) Bilateral endoscopic sphenoidotomy with polyp removal 5) Bilateral partial inferior turbinate resection 6) Adenoidectomy 7) Fusion stereotactic image guidance  SURGEON:  Surgeon(s) and Role:    * Ascencion Dike, MD - Primary  PHYSICIAN ASSISTANT:   ASSISTANTS: none   ANESTHESIA:   general  EBL:  Total I/O In: 2400 [I.V.:2400] Out: 300 [Blood:300]  BLOOD ADMINISTERED:none  DRAINS: none   LOCAL MEDICATIONS USED:  OTHER Cocaine  SPECIMEN:  Source of Specimen:  Bilateral sinus contents and adenoid tissue  DISPOSITION OF SPECIMEN:  PATHOLOGY  COUNTS:  YES  TOURNIQUET:  * No tourniquets in log *  DICTATION: .Other Dictation: Dictation Number [id number]  PLAN OF CARE: Discharge to home after PACU  PATIENT DISPOSITION:  PACU - hemodynamically stable.   Delay start of Pharmacological VTE agent (>24hrs) due to surgical blood loss or risk of bleeding: not applicable

## 2014-03-16 NOTE — Anesthesia Procedure Notes (Signed)
Procedure Name: Intubation Date/Time: 03/16/2014 8:45 AM Performed by: Maryella Shivers Pre-anesthesia Checklist: Patient identified, Emergency Drugs available, Suction available and Patient being monitored Patient Re-evaluated:Patient Re-evaluated prior to inductionOxygen Delivery Method: Circle System Utilized Preoxygenation: Pre-oxygenation with 100% oxygen Intubation Type: IV induction Ventilation: Mask ventilation without difficulty Laryngoscope Size: Mac and 4 Grade View: Grade I Tube type: Oral Number of attempts: 1 Airway Equipment and Method: stylet and oral airway Placement Confirmation: ETT inserted through vocal cords under direct vision,  positive ETCO2 and breath sounds checked- equal and bilateral Secured at: 22 cm Tube secured with: Tape Dental Injury: Teeth and Oropharynx as per pre-operative assessment

## 2014-03-16 NOTE — Discharge Instructions (Addendum)

## 2014-03-16 NOTE — Telephone Encounter (Signed)
FYI:: Dr. Nicola Police called to inform you that patient had sinus surgery and adenoidectomy today. Dr. Benjamine Mola prescribed the patient Percocet #30 Take 1-2 every 6 hours PRN for pain. Post-op pain should only last about a week per Dr. Benjamine Mola.

## 2014-03-16 NOTE — Telephone Encounter (Signed)
Ok looks like last visit was November, not sure if he follows with [place] anymore

## 2014-03-16 NOTE — Transfer of Care (Signed)
Immediate Anesthesia Transfer of Care Note  Patient: Eric Hayes  Procedure(s) Performed: Procedure(s): BILATERAL TURBINATE REDUCTION (Bilateral) BILATERAL ENDOSCOPIC TOTAL ETHMOIDECTOMY, BILATERAL MAXILLARY ANTROSTOMY, BILATERAL FRONTAL RECESS EXPLORATION AND BILATERAL SPHENOIDECTOMY WITH FUSION NAVIGATION (Bilateral)  ADENOIDECTOMY (Bilateral)  Patient Location: PACU  Anesthesia Type:General  Level of Consciousness: awake, alert  and oriented  Airway & Oxygen Therapy: Patient Spontanous Breathing and Patient connected to face mask oxygen  Post-op Assessment: Report given to PACU RN and Post -op Vital signs reviewed and stable  Post vital signs: Reviewed and stable  Complications: No apparent anesthesia complications

## 2014-03-16 NOTE — H&P (Signed)
  H&P Update  Pt's original H&P dated 02/17/14 and 03/03/14 are reviewed and placed in chart (to be scanned).  I personally examined the patient today.  No change in health. Proceed with bilateral FESS, adenoidectomy, turbinate reduction.

## 2014-03-17 NOTE — Op Note (Signed)
NAME:  CHARVEZ, VOORHIES NO.:  [id number]  MEDICAL RECORD NO.:  [id number]  LOCATION:                                 FACILITY:  PHYSICIAN:  Leta Baptist, MD            DATE OF BIRTH:  1976-01-04  DATE OF PROCEDURE:  03/16/2014 DATE OF DISCHARGE:  03/16/2014                              OPERATIVE REPORT   SURGEON:  Leta Baptist, MD  PREOPERATIVE DIAGNOSES: 1. Bilateral chronic pansinusitis. 2. Bilateral chronic nasal obstruction. 3. Bilateral inferior turbinate hypertrophy. 4. Adenoid hypertrophy.  POSTOPERATIVE DIAGNOSES: 1. Bilateral chronic pansinusitis. 2. Bilateral chronic nasal obstruction. 3. Bilateral inferior turbinate hypertrophy. 4. Adenoid hypertrophy.  PROCEDURE PERFORMED: 1. Bilateral endoscopic frontal sinus exploration with polyp removal. 2. Bilateral endoscopic total ethmoidectomy. 3. Bilateral endoscopic maxillary antrostomy with polyp removal. 4. Bilateral endoscopic sphenoidotomy with polyp removal. 5. Bilateral partial inferior turbinate resection. 6. Adenoidectomy. 7. Fusion stereotactic image guidance.  ANESTHESIA:  General endotracheal tube anesthesia.  COMPLICATIONS:  None.  ESTIMATED BLOOD LOSS:  300 mL.  INDICATION FOR PROCEDURE:  The patient is a 38 year old male with a history of bilateral chronic sinusitis and chronic nasal obstruction. The patient was previously treated with multiple courses of antibiotics, systemic and topical steroids, antihistamine, and decongestion without improvement in his symptoms.  On examination, he was noted to have a bilateral severe nasal mucosal congestion and obstruction.  Polypoid tissue was also noted within the middle meatus bilaterally.  On his CT scan, he was noted to have significant opacification of sinus cavities bilaterally.  It should also be noted that his adenoid was noted to be severely hypertrophied.  Based on the above findings, the decision was made for the patient to undergo the  above-stated procedures.  The risks, benefits, alternatives, and details of the procedures were discussed with the patient.  Questions were invited and answered.  Informed consent was obtained.  DESCRIPTION OF PROCEDURE:  The patient was taken to the operating room and placed supine on the operating table.  General endotracheal tube anesthesia was administered by the anesthesiologist.  The patient was positioned and prepped and draped in a standard fashion for the adenoidectomy procedure.  A Crowe-Davis mouth gag was inserted into the oral cavity for exposure.  A red rubber catheter was inserted via the left nostril, and it was used to retract the soft palate.  Indirect mirror examination of the nasopharynx revealed significant adenoid hypertrophy.  The adenoid was resected with electric cut adenotome. Hemostasis was achieved with the suction electrocautery device.  The patient was repositioned and prepped and draped in a standard fashion for nasal surgery.  The fusion navigation system was set up in the usual fashion.  Fiducial marker was placed on the forehead.  The image guidance system was functional throughout the case.  Pledgets soaked with Afrin were placed in both nasal cavities for vasoconstriction.  The pledgets were subsequently removed.  Endoscopic evaluation on both nasal cavities revealed polypoid tissue within the superior nasal cavities and the middle meatus bilaterally.  Attention was first focused on the left side.  The middle turbinate was carefully medialized.  Polypoid tissue was removed from the middle  meatus.  The uncinate process was removed.  The maxillary antrum was significantly enlarged.  A significant amount of polypoid tissue was removed from the antrum and the maxillary sinus.  The anterior and the posterior ethmoid cavities were then opened using a combination of suction catheter, a Tru- Cut forceps, Blakesley forceps, and microdebrider.  Both the  anterior and posterior ethmoid cavities were noted to be filled with a polypoid tissue.  The polyps were removed using a combination of Blakesley forceps and microdebrider.  The sphenoid opening was then enlarged using a combination of suction catheters and mushroom puncher.  Polypoid tissue was also removed from the inferior portion of the sphenoid cavity.  Attention was then focused on the frontal sinus.  The frontal recess was identified.  It was noted to be filled with a polypoid tissue.  The polyps were removed using a combination of angled forceps and a microdebrider.  Mucoid drainage was suctioned from the frontal sinus.  Polypoid tissue was also removed from the inferior portion of the frontal sinus.  At the conclusion of the procedure, the frontal sinus was noted to be widely patent.  Attention was then focused on the inferior turbinate.  The inferior one-half of the inferior turbinate was crossclamped with a straight Kelly clamp.  The inferior one-half of the inferior turbinate was then resected with a pair of cross cutting scissors.  Hemostasis was achieved with suction electrocautery in pledgets soaked with 4% cocaine.  The same procedure was then repeated on the contralateral side without exceptions.  Similar findings were also noted on the contralateral side.  The sinus cavity were then copiously irrigated.  Propel drop in coated stents were then placed in the ethmoid cavities bilaterally.  NasoPore dressing was placed in the inferior portion of the nasal cavity bilaterally.  That concluded the procedure for the patient.  The care of the patient was turned over to the anesthesiologist.  The patient was awakened from anesthesia without difficulty.  He was extubated and transferred to the recovery room in good condition.  OPERATIVE FINDINGS:  Bilateral chronic pansinusitis with polypoid tissue filling the ethmoid, maxillary, sphenoid, and the frontal recess bilaterally.   Significant bilateral inferior turbinate hypertrophy and adenoid hypertrophy were also noted.  SPECIMEN:  Bilateral sinus contents and adenoid tissue.  FOLLOWUP CARE:  The patient will be discharged home once he is awake and alert.  He will be placed on Percocet 1-2 tablets p.o. q.6 hours p.r.n. pain.  He will follow up in my office in approximately 1 week.     Leta Baptist, MD   ______________________________ Leta Baptist, MD    ST/MEDQ  D:  03/16/2014  T:  03/17/2014  Job:  [id number]

## 2014-03-19 ENCOUNTER — Telehealth: Payer: Self-pay

## 2014-03-19 NOTE — Telephone Encounter (Signed)
Patient called requesting his tyelenol #3 be called into [facility].  Spoke with patient, he is aware we cannot refill medication until he is seen.  He will make an appointment.

## 2014-03-22 ENCOUNTER — Encounter (HOSPITAL_BASED_OUTPATIENT_CLINIC_OR_DEPARTMENT_OTHER): Payer: Self-pay | Admitting: Otolaryngology

## 2014-03-23 DIAGNOSIS — J320 Chronic maxillary sinusitis: Secondary | ICD-10-CM | POA: Diagnosis not present

## 2014-03-23 DIAGNOSIS — J338 Other polyp of sinus: Secondary | ICD-10-CM | POA: Diagnosis not present

## 2014-03-23 DIAGNOSIS — J321 Chronic frontal sinusitis: Secondary | ICD-10-CM | POA: Diagnosis not present

## 2014-03-23 DIAGNOSIS — J322 Chronic ethmoidal sinusitis: Secondary | ICD-10-CM | POA: Diagnosis not present

## 2014-03-26 ENCOUNTER — Ambulatory Visit: Payer: Medicare Other | Admitting: Physical Medicine & Rehabilitation

## 2014-03-26 ENCOUNTER — Encounter: Payer: Medicare Other | Attending: Physical Medicine & Rehabilitation

## 2014-03-30 ENCOUNTER — Telehealth: Payer: Self-pay | Admitting: Physical Medicine & Rehabilitation

## 2014-03-30 NOTE — Telephone Encounter (Signed)
LVM: on wife's phone that we need pt, Eric Hayes, to please call to make an appointment with Dr. Letta Pate... Pt has a form here that he wants fill out but per Dr. Raliegh Ip pt needs to come in first.... 250 pm 04.28.2015

## 2014-04-08 ENCOUNTER — Encounter: Payer: Self-pay | Admitting: Internal Medicine

## 2014-04-08 ENCOUNTER — Telehealth: Payer: Self-pay | Admitting: Internal Medicine

## 2014-04-08 NOTE — Telephone Encounter (Signed)
I have mailed a letter to pt stating that he needs to contact us regarding faxes that have come through from [facility] for a back support, and knee orthosis.

## 2014-04-12 DIAGNOSIS — F319 Bipolar disorder, unspecified: Secondary | ICD-10-CM | POA: Diagnosis not present

## 2014-04-12 DIAGNOSIS — Z79899 Other long term (current) drug therapy: Secondary | ICD-10-CM | POA: Diagnosis not present

## 2014-04-13 ENCOUNTER — Telehealth: Payer: Self-pay | Admitting: Family Medicine

## 2014-04-13 ENCOUNTER — Telehealth: Payer: Self-pay | Admitting: Physical Medicine & Rehabilitation

## 2014-04-13 NOTE — Telephone Encounter (Signed)
Pt called at 203 pm on 05.12.2015 stating he does not want to be apart of the practice anymore. He wants all his records even the ones that were faxed here; pt was advised we need a release of records on file; he states he does not have transportation; pt advised one is being mailed to him and please mail back. He will go PCP on 05.13.2015 to seek help and a new route. If there any questions please don't hesitate to call pt on his cell number in the system.

## 2014-04-13 NOTE — Telephone Encounter (Signed)
We received a refill request from [facility]. I called the patient and left a message that he will need to schedule a follow up visit per [name] PAC. CLS

## 2014-04-13 NOTE — Telephone Encounter (Signed)
Patient has been discharged from practice per his request.

## 2014-04-14 ENCOUNTER — Institutional Professional Consult (permissible substitution): Payer: Medicare Other | Admitting: Medical

## 2014-04-20 ENCOUNTER — Ambulatory Visit (INDEPENDENT_AMBULATORY_CARE_PROVIDER_SITE_OTHER): Payer: Medicare Other | Admitting: Medical

## 2014-04-20 ENCOUNTER — Encounter: Payer: Self-pay | Admitting: Medical

## 2014-04-20 ENCOUNTER — Telehealth: Payer: Self-pay | Admitting: Medical

## 2014-04-20 VITALS — BP 140/78 | HR 82 | Temp 97.5°F | Resp 16 | Wt 266.0 lb

## 2014-04-20 DIAGNOSIS — M549 Dorsalgia, unspecified: Secondary | ICD-10-CM

## 2014-04-20 DIAGNOSIS — G8929 Other chronic pain: Secondary | ICD-10-CM

## 2014-04-20 DIAGNOSIS — M47817 Spondylosis without myelopathy or radiculopathy, lumbosacral region: Secondary | ICD-10-CM

## 2014-04-20 MED ORDER — HYDROCODONE-ACETAMINOPHEN 5-325 MG PO TABS: ORAL_TABLET | ORAL | Status: DC

## 2014-04-20 NOTE — Telephone Encounter (Signed)
When we referred him to pain clinic last fall, I thought we had all of his prior orthopedic notes and MRI notes, but I do not see them in the electronic or paper chart.  Please see if you have copies of these things / where the records are from before we referred to pain clinic  Please send a copy of these things along with his recent November 2014 pain clinic note to Dr. Lynann Bologna at [facility] orthopedics. Please call his nurse and ask if Dr. Lynann Bologna will look over the records to see if he feels like he can help this patient?  Will we need to get an updated MRI?  Last one was 09/2012.  Please send this too.  If Dr. Lynann Bologna would be interested in seeing him, then go ahead and refer
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
## 2014-04-23 NOTE — Telephone Encounter (Signed)
I FAX EVERYTHING OVER TO DR. Lynann Bologna. CLS

## 2014-05-03 DIAGNOSIS — J338 Other polyp of sinus: Secondary | ICD-10-CM | POA: Diagnosis not present

## 2014-05-03 DIAGNOSIS — J320 Chronic maxillary sinusitis: Secondary | ICD-10-CM | POA: Diagnosis not present

## 2014-05-03 DIAGNOSIS — J322 Chronic ethmoidal sinusitis: Secondary | ICD-10-CM | POA: Diagnosis not present

## 2014-05-03 DIAGNOSIS — J321 Chronic frontal sinusitis: Secondary | ICD-10-CM | POA: Diagnosis not present

## 2014-06-09 ENCOUNTER — Ambulatory Visit
Admission: RE | Admit: 2014-06-09 | Discharge: 2014-06-09 | Disposition: A | Discharge status: Home / Self Care | Payer: Medicare Other | Source: Ambulatory Visit | Attending: Medical | Admitting: Medical

## 2014-06-09 ENCOUNTER — Encounter: Payer: Self-pay | Admitting: Medical

## 2014-06-09 ENCOUNTER — Ambulatory Visit (INDEPENDENT_AMBULATORY_CARE_PROVIDER_SITE_OTHER): Payer: Medicare Other | Admitting: Medical

## 2014-06-09 VITALS — BP 120/80 | HR 78 | Temp 97.7°F | Resp 16 | Wt 258.0 lb

## 2014-06-09 DIAGNOSIS — M25462 Effusion, left knee: Secondary | ICD-10-CM

## 2014-06-09 DIAGNOSIS — R509 Fever, unspecified: Secondary | ICD-10-CM

## 2014-06-09 DIAGNOSIS — R062 Wheezing: Secondary | ICD-10-CM | POA: Diagnosis not present

## 2014-06-09 DIAGNOSIS — M25569 Pain in unspecified knee: Secondary | ICD-10-CM

## 2014-06-09 DIAGNOSIS — M25469 Effusion, unspecified knee: Secondary | ICD-10-CM

## 2014-06-09 DIAGNOSIS — R0602 Shortness of breath: Secondary | ICD-10-CM

## 2014-06-09 DIAGNOSIS — R059 Cough, unspecified: Secondary | ICD-10-CM | POA: Diagnosis not present

## 2014-06-09 DIAGNOSIS — M25461 Effusion, right knee: Secondary | ICD-10-CM

## 2014-06-09 DIAGNOSIS — R05 Cough: Secondary | ICD-10-CM | POA: Diagnosis not present

## 2014-06-09 LAB — CBC WITH DIFFERENTIAL/PLATELET
BASOS PCT: 0 % (ref 0–1)
Basophils Absolute: 0 10*3/uL (ref 0.0–0.1)
Eosinophils Absolute: 0.5 10*3/uL (ref 0.0–0.7)
Eosinophils Relative: 6 % — ABNORMAL HIGH (ref 0–5)
HCT: 40.8 % (ref 39.0–52.0)
Hemoglobin: 14.3 g/dL (ref 13.0–17.0)
LYMPHS PCT: 26 % (ref 12–46)
Lymphs Abs: 2 10*3/uL (ref 0.7–4.0)
MCH: 28.9 pg (ref 26.0–34.0)
MCHC: 35 g/dL (ref 30.0–36.0)
MCV: 82.6 fL (ref 78.0–100.0)
Monocytes Absolute: 0.5 10*3/uL (ref 0.1–1.0)
Monocytes Relative: 6 % (ref 3–12)
NEUTROS ABS: 4.8 10*3/uL (ref 1.7–7.7)
Neutrophils Relative %: 62 % (ref 43–77)
PLATELETS: 254 10*3/uL (ref 150–400)
RBC: 4.94 MIL/uL (ref 4.22–5.81)
RDW: 14 % (ref 11.5–15.5)
WBC: 7.8 10*3/uL (ref 4.0–10.5)

## 2014-06-09 LAB — D-DIMER, QUANTITATIVE: D-Dimer, Quant: 1.07 ug/mL-FEU — ABNORMAL HIGH (ref 0.00–0.48)

## 2014-06-09 MED ORDER — HYDROCODONE-ACETAMINOPHEN 5-325 MG PO TABS: ORAL_TABLET | ORAL | Status: DC

## 2014-06-09 NOTE — Progress Notes (Signed)
Subjective: Here for concerns.  Accompanied by wife.    Last Wednesday a week ago awoke with chills, had 103.4 fever.  Had fevers for the next 4 days, similar temps, down to low 100s.  Was using Tylenol, Advil, nothing seemed to help.  Used cold rags on his head.   Had to change 3-4 shirts daily due to chills and sweats.  Had bad migraines those 4 days.   Sunday morning felt better, felt less fatigued.  Felt fine most of the day Sunday.   Monday morning almost fell getting out of bed, ankles and knees felt weak.  Been in excruciating pain since Monday due to ankle and knee pain.    A month ago had bad neck pain, felt a pop, not sure what he did to his neck.   Been having sharp pain shooting down neck and shoulder.     Knees and ankles both seem swollen.  No fever in the last few days.  Feels a little raspy in the voice, different than normal.  Denies SOB.  No rash.   Still has his back issues.  He notes he had not heard back from Dr. Lynann Bologna, but he apparently dropped his phone in the pond.    ROS as in subjective   Objective: BP 120/80  Pulse 78  Temp(Src) 97.7 F (36.5 C) (Oral)  Resp 16  Wt 258 lb (117.028 kg)  General appearance: alert, no distress, WD/WN, seems to be in pain, pain getting in and out of the chair HEENT: normocephalic, sclerae anicteric, PERRLA, EOMi, nares patent, no discharge or erythema, pharynx with mild erythema Oral cavity: MMM, no lesions Neck: supple, no lymphadenopathy, no thyromegaly, no masses, no JVD Heart: seems a little tachycardic on exam, otherwise RRR, normal S1, S2, no murmurs Lungs: scattered upper field bilat wheezes, few rhonchi, no rales Abdomen: +bs, soft, non tender, non distended, no masses, no hepatomegaly, no splenomegaly Musculoskeletal: he reports pain with knee and ankle ROM, he seems limited in both bilat knee and ankle ROM, however there is no obvious swelling, no obvious laxity, no obvious deformity of the LE today, there is no  erythema or ecchymosis, no purpura.  He is in pain with transferring out of the chair and with walking. Extremities: no edema, no cyanosis, no clubbing Pulses: 2+ symmetric, upper and lower extremities, normal cap refill Neurological: seemingly normal LE strength and sensation  Assessment: Encounter Diagnoses  Name Primary?  . Fever, unspecified Yes  . Knee pain, acute, unspecified laterality   . Bilateral knee swelling   . SOB (shortness of breath)   . Wheezing    Plan: We discussed his concerns today.  Not sure what to think about his presentation today.  He reports pain, seems to be in pain however his knee and ankles are not swollen as he suggests they have been.   He has had what sounds like a recent respiratory infection.  The only obvious findings today on exam are mild tachycardia and wheezing on exam.  We will check a chest x-ray, CBC and d-dimer.    I advise that it is his responsibility to make sure he gets in contact with the orthopedic office.  He lost his phone but didn't bother calling us or the orthopedic office.  I gave him a limited supply of pain medication today and advised I would not continue to prescribe this.  He will make a followup appointment with Dr. Lynann Bologna regarding his chronic back issues.  Followup pending  labs and chest x-ray

## 2014-06-10 ENCOUNTER — Other Ambulatory Visit: Payer: Self-pay

## 2014-06-10 ENCOUNTER — Other Ambulatory Visit: Payer: Self-pay | Admitting: Medical

## 2014-06-10 ENCOUNTER — Ambulatory Visit: Payer: Medicare Other | Admitting: Medical

## 2014-06-10 ENCOUNTER — Ambulatory Visit
Admission: RE | Admit: 2014-06-10 | Discharge: 2014-06-10 | Disposition: A | Discharge status: Home / Self Care | Payer: Medicare Other | Source: Ambulatory Visit | Attending: Medical | Admitting: Medical

## 2014-06-10 DIAGNOSIS — R0602 Shortness of breath: Secondary | ICD-10-CM | POA: Diagnosis not present

## 2014-06-10 DIAGNOSIS — R000 Tachycardia, unspecified: Secondary | ICD-10-CM

## 2014-06-10 DIAGNOSIS — R7989 Other specified abnormal findings of blood chemistry: Secondary | ICD-10-CM

## 2014-06-10 MED ORDER — IOHEXOL 350 MG/ML SOLN
125.0000 mL | Freq: Once | INTRAVENOUS | Status: AC | PRN
Start: 2014-06-10 — End: 2014-06-10
  Administered 2014-06-10: 125 mL via INTRAVENOUS

## 2014-06-16 ENCOUNTER — Ambulatory Visit (INDEPENDENT_AMBULATORY_CARE_PROVIDER_SITE_OTHER): Payer: Medicare Other | Admitting: Medical

## 2014-06-16 ENCOUNTER — Encounter: Payer: Self-pay | Admitting: Medical

## 2014-06-16 VITALS — BP 124/70 | HR 90 | Temp 97.8°F | Resp 14 | Ht 75.0 in | Wt 267.0 lb

## 2014-06-16 DIAGNOSIS — E119 Type 2 diabetes mellitus without complications: Secondary | ICD-10-CM | POA: Diagnosis not present

## 2014-06-16 DIAGNOSIS — R0602 Shortness of breath: Secondary | ICD-10-CM

## 2014-06-16 DIAGNOSIS — E785 Hyperlipidemia, unspecified: Secondary | ICD-10-CM | POA: Diagnosis not present

## 2014-06-16 DIAGNOSIS — F17200 Nicotine dependence, unspecified, uncomplicated: Secondary | ICD-10-CM

## 2014-06-16 DIAGNOSIS — R9389 Abnormal findings on diagnostic imaging of other specified body structures: Secondary | ICD-10-CM

## 2014-06-16 DIAGNOSIS — K219 Gastro-esophageal reflux disease without esophagitis: Secondary | ICD-10-CM | POA: Diagnosis not present

## 2014-06-16 LAB — LIPID PANEL
Cholesterol: 149 mg/dL (ref 0–200)
HDL: 37 mg/dL — ABNORMAL LOW (ref >39)
LDL Cholesterol: 89 mg/dL (ref 0–99)
TRIGLYCERIDES: 116 mg/dL (ref <150)
Total CHOL/HDL Ratio: 4 Ratio
VLDL: 23 mg/dL (ref 0–40)

## 2014-06-16 LAB — HEMOGLOBIN A1C
Hgb A1c MFr Bld: 6.2 % — ABNORMAL HIGH (ref <5.7)
MEAN PLASMA GLUCOSE: 131 mg/dL — AB (ref <117)

## 2014-06-16 MED ORDER — OMEPRAZOLE 40 MG PO CPDR: 40.0000 mg | DELAYED_RELEASE_CAPSULE | Freq: Every day | ORAL | Status: DC

## 2014-06-16 NOTE — Progress Notes (Signed)
   Subjective:    Patient ID: Eric Hayes, male    DOB: 02-Mar-1976, 38 y.o.   MRN: [id number]  HPI  Patient presenting for follow up for fevers, wheezing and lower leg swelling. Patient had chest Xray and CT angiogram both of which were negative for pneumonia, PE, CHF but did show mildly enlarged hilar lymph nodes (also present on previous CT). Today, patient denies fevers, difficulty breathing, wheezing and lower leg edema has improved. Also denies n/v, throat pain, difficulty swallowing, dysuria, diarrhea. He does have a history of allergies, had adenoidectomy this last Spring with some improvement in his allergies and breathing. He has used dymista, used to take Singulair. Patient used to have an inhaler but never used it. Patient currently smokes 1/2-3/4ppd for 15 years.  Of note, his primary concern today is having mid-sternal chest pain, epigastric pain in the last couple of days. At times, he feels some sharp abdominal pain but in general feels sore belly pain, states that he has had indigestion really bad in the last week. He admits to having had reflux in the past and was treated with omeprazole with significant improvement. Also admits that he has been eating plenty of tomato based foods.  After further questioning today, although he has been off medication for sugar and cholesterol the past year, lately his wife states that he is eating at least a half dozen donuts a week, drinking sodas pretty regularly, eats red meat at least 3 times a week   Review of Systems As in subjective.    Objective:   Physical Exam  BP 124/70  Pulse 90  Temp(Src) 97.8 F (36.6 C)  Resp 14  Ht 6\' 3"  (1.905 m)  Wt 267 lb (121.11 kg)  BMI 33.37 kg/m2  General appearance: alert, no distress, WD/WN, overweight Heart: RRR, normal S1, S2, no murmurs Lungs: CTA bilaterally, no wheezes, rhonchi, or rales Abdomen: +bs, soft, non tender, non distended, no masses, no hepatomegaly, no splenomegaly Pulses:  2+ symmetric, upper and lower extremities, normal cap refill Ext: no edema      Assessment & Plan:   Encounter Diagnoses  Name Primary?  . Gastroesophageal reflux disease without esophagitis Yes  . Type II or unspecified type diabetes mellitus without mention of complication, not stated as uncontrolled   . Hyperlipidemia   . Smoker   . SOB (shortness of breath)   . Abnormal CT scan, chest    GERD-discussed trigger foods, avoidance of trigger foods, begin back on omeprazole  Hemoglobin A1c and lipid panel today-reiterated the importance of diet, exercise, pending labs may need to restart medication  Smoker-advise he quit tobacco  Shortness of breath-resolved  Discussed his recent CT chest findings which is likely lymphadenitis related to his recent respiratory illness  F/u pending labs

## 2014-06-18 DIAGNOSIS — M545 Low back pain, unspecified: Secondary | ICD-10-CM | POA: Diagnosis not present

## 2014-06-30 ENCOUNTER — Telehealth: Payer: Self-pay | Admitting: Internal Medicine

## 2014-06-30 NOTE — Telephone Encounter (Signed)
Fax from [facility] is wanting to order pt a back support, tried calling pt multiple times to verify this. But phone # not in service. Will put order farm in red folder up front

## 2014-08-06 ENCOUNTER — Emergency Department (HOSPITAL_COMMUNITY)
Admission: EM | Admit: 2014-08-06 | Discharge: 2014-08-06 | Disposition: A | Discharge status: Home / Self Care | Payer: Medicare Other | Attending: Emergency Medicine | Admitting: Emergency Medicine

## 2014-08-06 ENCOUNTER — Encounter (HOSPITAL_COMMUNITY): Payer: Self-pay | Admitting: Emergency Medicine

## 2014-08-06 DIAGNOSIS — Z792 Long term (current) use of antibiotics: Secondary | ICD-10-CM | POA: Diagnosis not present

## 2014-08-06 DIAGNOSIS — Z79899 Other long term (current) drug therapy: Secondary | ICD-10-CM | POA: Insufficient documentation

## 2014-08-06 DIAGNOSIS — G8929 Other chronic pain: Secondary | ICD-10-CM | POA: Insufficient documentation

## 2014-08-06 DIAGNOSIS — Z8614 Personal history of Methicillin resistant Staphylococcus aureus infection: Secondary | ICD-10-CM | POA: Insufficient documentation

## 2014-08-06 DIAGNOSIS — F17200 Nicotine dependence, unspecified, uncomplicated: Secondary | ICD-10-CM | POA: Diagnosis not present

## 2014-08-06 DIAGNOSIS — H00019 Hordeolum externum unspecified eye, unspecified eyelid: Secondary | ICD-10-CM | POA: Diagnosis not present

## 2014-08-06 DIAGNOSIS — Z862 Personal history of diseases of the blood and blood-forming organs and certain disorders involving the immune mechanism: Secondary | ICD-10-CM | POA: Diagnosis not present

## 2014-08-06 DIAGNOSIS — G4733 Obstructive sleep apnea (adult) (pediatric): Secondary | ICD-10-CM | POA: Insufficient documentation

## 2014-08-06 DIAGNOSIS — Z86718 Personal history of other venous thrombosis and embolism: Secondary | ICD-10-CM | POA: Diagnosis not present

## 2014-08-06 DIAGNOSIS — H5710 Ocular pain, unspecified eye: Secondary | ICD-10-CM | POA: Diagnosis not present

## 2014-08-06 DIAGNOSIS — F411 Generalized anxiety disorder: Secondary | ICD-10-CM | POA: Insufficient documentation

## 2014-08-06 DIAGNOSIS — M1990 Unspecified osteoarthritis, unspecified site: Secondary | ICD-10-CM | POA: Insufficient documentation

## 2014-08-06 DIAGNOSIS — F319 Bipolar disorder, unspecified: Secondary | ICD-10-CM | POA: Diagnosis not present

## 2014-08-06 DIAGNOSIS — Z880 Allergy status to penicillin: Secondary | ICD-10-CM | POA: Insufficient documentation

## 2014-08-06 DIAGNOSIS — Z8639 Personal history of other endocrine, nutritional and metabolic disease: Secondary | ICD-10-CM | POA: Insufficient documentation

## 2014-08-06 DIAGNOSIS — Z9981 Dependence on supplemental oxygen: Secondary | ICD-10-CM | POA: Diagnosis not present

## 2014-08-06 DIAGNOSIS — H00013 Hordeolum externum right eye, unspecified eyelid: Secondary | ICD-10-CM

## 2014-08-06 DIAGNOSIS — I10 Essential (primary) hypertension: Secondary | ICD-10-CM | POA: Diagnosis not present

## 2014-08-06 MED ORDER — OXYCODONE-ACETAMINOPHEN 5-325 MG PO TABS: 1.0000 | ORAL_TABLET | ORAL | Status: DC | PRN

## 2014-08-06 MED ORDER — OXYCODONE-ACETAMINOPHEN 5-325 MG PO TABS
1.0000 | ORAL_TABLET | Freq: Once | ORAL | Status: AC
Start: 2014-08-06 — End: 2014-08-06
  Administered 2014-08-06: 1 via ORAL
  Filled 2014-08-06: qty 1

## 2014-08-06 MED ORDER — ERYTHROMYCIN 5 MG/GM OP OINT
TOPICAL_OINTMENT | Freq: Once | OPHTHALMIC | Status: AC
  Administered 2014-08-06: 22:00:00 via OPHTHALMIC
  Filled 2014-08-06: qty 3.5

## 2014-08-06 NOTE — ED Notes (Signed)
Patient verbalizes understanding of discharge instructions, prescription medications, follow up care, and home care. Family member demonstrates eye ointment administration. Patient ambulatory out of department at this time, escorted by family.

## 2014-08-06 NOTE — ED Notes (Signed)
Patient c/o bilateral eye pain and swelling. Right eye is worse than left. Patient states he had bilateral sti's last week, and feels like he is getting another one to the right eye. Patient is A&OX4 at this time

## 2014-08-06 NOTE — ED Notes (Signed)
Onset last week sti in both eyes bilaterally, used warm compresses and they drained, right eye swollen, red,  pain, blurred vision.

## 2014-08-06 NOTE — ED Provider Notes (Signed)
Medical screening examination/treatment/procedure(s) were conducted as a shared visit with non-physician practitioner(s) and myself.  I personally evaluated the patient during the encounter.   EKG Interpretation None      Patient with swelling and redness to the lower lid laterally of the right eye. 2 days ago medially patient had bilateral styes one in the right I1 in the left eye. They have cleared up. Patient treated them with warm compresses. Finding here today in the of right eye is consistent with a lateral lower lid stye. Eric Hayes redness there and also some swelling to the lower lid. No significant pain with ocular eye motion. Has full range of motion. No concern for orbital cellulitis or a post-septal cellulitis. Patient can be treated with warm compresses anti-inflammatories and erythromycin ophthalmic ointment. Patient does not wear contacts. Patient has had no injury to the eye.  Fredia Sorrow, MD 08/06/14 2142

## 2014-08-06 NOTE — Discharge Instructions (Signed)
Sty A sty (hordeolum) is an infection of a gland in the eyelid located at the base of the eyelash. A sty may develop a white or yellow head of pus. It can be puffy (swollen). Usually, the sty will burst and pus will come out on its own. They do not leave lumps in the eyelid once they drain. A sty is often confused with another form of cyst of the eyelid called a chalazion. Chalazions occur within the eyelid and not on the edge where the bases of the eyelashes are. They often are red, sore and then form firm lumps in the eyelid. CAUSES   Germs (bacteria).  Lasting (chronic) eyelid inflammation. SYMPTOMS   Tenderness, redness and swelling along the edge of the eyelid at the base of the eyelashes.  Sometimes, there is a white or yellow head of pus. It may or may not drain. DIAGNOSIS  An ophthalmologist will be able to distinguish between a sty and a chalazion and treat the condition appropriately.  TREATMENT   Styes are typically treated with warm packs (compresses) until drainage occurs.  In rare cases, medicines that kill germs (antibiotics) may be prescribed. These antibiotics may be in the form of drops, cream or pills.  If a hard lump has formed, it is generally necessary to do a small incision and remove the hardened contents of the cyst in a minor surgical procedure done in the office.  In suspicious cases, your caregiver may send the contents of the cyst to the lab to be certain that it is not a rare, but dangerous form of cancer of the glands of the eyelid. HOME CARE INSTRUCTIONS   Wash your hands often and dry them with a clean towel. Avoid touching your eyelid. This may spread the infection to other parts of the eye.  Apply heat to your eyelid for 10 to 20 minutes, several times a day, to ease pain and help to heal it faster.  Do not squeeze the sty. Allow it to drain on its own. Wash your eyelid carefully 3 to 4 times per day to remove any pus. SEEK IMMEDIATE MEDICAL CARE IF:     Your eye becomes painful or puffy (swollen).  Your vision changes.  Your sty does not drain by itself within 3 days.  Your sty comes back within a short period of time, even with treatment.  You have redness (inflammation) around the eye.  You have a fever. Document Released: 08/29/2005 Document Revised: 02/11/2012 Document Reviewed: 03/05/2014 [facility] Patient Information 2015 [place], [place]. This information is not intended to replace advice given to you by your health care provider. Make sure you discuss any questions you have with your health care provider.   Apply the erythromycin ointment to your lower eyelid margin 4 times daily for the next 7 days.  Continue using warm compresses to help with swelling and to heal this infection.  As discussed, return here for a recheck if he develops worsening pain or swelling, particularly if eye-movement becomes painful for you.  You may take the oxycodone prescribed for pain relief.  This will make you drowsy - do not drive within 4 hours of taking this medication.

## 2014-08-10 ENCOUNTER — Ambulatory Visit: Payer: Medicare Other | Admitting: Medical

## 2014-08-13 ENCOUNTER — Emergency Department (HOSPITAL_COMMUNITY)
Admission: EM | Admit: 2014-08-13 | Discharge: 2014-08-13 | Disposition: A | Discharge status: Home / Self Care | Payer: Medicare Other | Attending: Emergency Medicine | Admitting: Emergency Medicine

## 2014-08-13 ENCOUNTER — Encounter (HOSPITAL_COMMUNITY): Payer: Self-pay | Admitting: Emergency Medicine

## 2014-08-13 DIAGNOSIS — G8929 Other chronic pain: Secondary | ICD-10-CM | POA: Insufficient documentation

## 2014-08-13 DIAGNOSIS — R220 Localized swelling, mass and lump, head: Secondary | ICD-10-CM | POA: Insufficient documentation

## 2014-08-13 DIAGNOSIS — G4730 Sleep apnea, unspecified: Secondary | ICD-10-CM | POA: Insufficient documentation

## 2014-08-13 DIAGNOSIS — Z9981 Dependence on supplemental oxygen: Secondary | ICD-10-CM | POA: Insufficient documentation

## 2014-08-13 DIAGNOSIS — E119 Type 2 diabetes mellitus without complications: Secondary | ICD-10-CM | POA: Insufficient documentation

## 2014-08-13 DIAGNOSIS — Z8614 Personal history of Methicillin resistant Staphylococcus aureus infection: Secondary | ICD-10-CM | POA: Insufficient documentation

## 2014-08-13 DIAGNOSIS — K047 Periapical abscess without sinus: Secondary | ICD-10-CM

## 2014-08-13 DIAGNOSIS — F411 Generalized anxiety disorder: Secondary | ICD-10-CM | POA: Diagnosis not present

## 2014-08-13 DIAGNOSIS — F319 Bipolar disorder, unspecified: Secondary | ICD-10-CM | POA: Insufficient documentation

## 2014-08-13 DIAGNOSIS — I10 Essential (primary) hypertension: Secondary | ICD-10-CM | POA: Insufficient documentation

## 2014-08-13 DIAGNOSIS — F17200 Nicotine dependence, unspecified, uncomplicated: Secondary | ICD-10-CM | POA: Diagnosis not present

## 2014-08-13 DIAGNOSIS — Z79899 Other long term (current) drug therapy: Secondary | ICD-10-CM | POA: Insufficient documentation

## 2014-08-13 DIAGNOSIS — Z86718 Personal history of other venous thrombosis and embolism: Secondary | ICD-10-CM | POA: Insufficient documentation

## 2014-08-13 DIAGNOSIS — R221 Localized swelling, mass and lump, neck: Secondary | ICD-10-CM | POA: Diagnosis not present

## 2014-08-13 DIAGNOSIS — M1990 Unspecified osteoarthritis, unspecified site: Secondary | ICD-10-CM | POA: Insufficient documentation

## 2014-08-13 DIAGNOSIS — Z880 Allergy status to penicillin: Secondary | ICD-10-CM | POA: Diagnosis not present

## 2014-08-13 MED ORDER — HYDROCODONE-ACETAMINOPHEN 5-325 MG PO TABS
2.0000 | ORAL_TABLET | ORAL | Status: DC | PRN
Start: 2014-08-13 — End: 2014-08-17

## 2014-08-13 MED ORDER — HYDROCODONE-ACETAMINOPHEN 5-325 MG PO TABS
2.0000 | ORAL_TABLET | Freq: Once | ORAL | Status: AC
  Administered 2014-08-13: 2 via ORAL

## 2014-08-13 MED ORDER — HYDROCODONE-ACETAMINOPHEN 5-325 MG PO TABS
ORAL_TABLET | ORAL | Status: AC
  Filled 2014-08-13: qty 1

## 2014-08-13 MED ORDER — CLINDAMYCIN PHOSPHATE 600 MG/4ML IJ SOLN: 600.0000 mg | Freq: Once | INTRAMUSCULAR | Status: DC

## 2014-08-13 MED ORDER — SODIUM CHLORIDE 0.9 % IV SOLN
Freq: Once | INTRAVENOUS | Status: AC
  Administered 2014-08-13: 18:00:00 via INTRAVENOUS

## 2014-08-13 MED ORDER — CLINDAMYCIN PHOSPHATE 600 MG/50ML IV SOLN
600.0000 mg | Freq: Once | INTRAVENOUS | Status: AC
  Administered 2014-08-13: 600 mg via INTRAVENOUS

## 2014-08-13 MED ORDER — CLINDAMYCIN PHOSPHATE 600 MG/50ML IV SOLN
INTRAVENOUS | Status: AC
  Administered 2014-08-13: 600 mg via INTRAVENOUS
  Filled 2014-08-13: qty 50

## 2014-08-13 MED ORDER — CLINDAMYCIN HCL 150 MG PO CAPS: 300.0000 mg | ORAL_CAPSULE | Freq: Four times a day (QID) | ORAL | Status: DC
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
## 2014-08-13 NOTE — ED Notes (Signed)
Patient with no complaints at this time. Respirations even and unlabored. Skin warm/dry. Discharge instructions reviewed with patient at this time. Patient given opportunity to voice concerns/ask questions. IV removed per policy and band-aid applied to site. Patient discharged at this time and left Emergency Department with steady gait.  
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
## 2014-08-13 NOTE — Discharge Instructions (Signed)
Abscessed Tooth An abscessed tooth is an infection around your tooth. It may be caused by holes or damage to the tooth (cavity) or a dental disease. An abscessed tooth causes mild to very bad pain in and around the tooth. See your dentist right away if you have tooth or gum pain. HOME CARE  Take your medicine as told. Finish it even if you start to feel better.  Do not drive after taking pain medicine.  Rinse your mouth (gargle) often with salt water ( teaspoon salt in 8 ounces of warm water).  Do not apply heat to the outside of your face. GET HELP RIGHT AWAY IF:   You have a temperature by mouth above 102 F (38.9 C), not controlled by medicine.  You have chills and a very bad headache.  You have problems breathing or swallowing.  Your mouth will not open.  You develop puffiness (swelling) on the neck or around the eye.  Your pain is not helped by medicine.  Your pain is getting worse instead of better. MAKE SURE YOU:   Understand these instructions.  Will watch your condition.  Will get help right away if you are not doing well or get worse. Document Released: 05/07/2008 Document Revised: 02/11/2012 Document Reviewed: 02/27/2011 Exit[employer] Patient Information 2015 [employer]. This information is not intended to replace advice given to you by your health care provider. Make sure you discuss any questions you have with your health care provider.  

## 2014-08-13 NOTE — ED Notes (Addendum)
Pt states he has a broken tooth upper right side, significant facial swelling on right side noted, pt able to breathe and swallow without difficulty. Pt instructed to come to ED. Pt states the swelling occurred after using clove oil. Pt states all the swelling has happened over past 10 hours.

## 2014-08-17 ENCOUNTER — Ambulatory Visit (INDEPENDENT_AMBULATORY_CARE_PROVIDER_SITE_OTHER): Payer: Medicare Other | Admitting: Medical

## 2014-08-17 ENCOUNTER — Encounter: Payer: Self-pay | Admitting: Medical

## 2014-08-17 VITALS — BP 122/80 | HR 76 | Temp 98.0°F | Resp 16 | Wt 259.0 lb

## 2014-08-17 DIAGNOSIS — Z23 Encounter for immunization: Secondary | ICD-10-CM | POA: Diagnosis not present

## 2014-08-17 DIAGNOSIS — H00019 Hordeolum externum unspecified eye, unspecified eyelid: Secondary | ICD-10-CM

## 2014-08-17 DIAGNOSIS — K036 Deposits [accretions] on teeth: Secondary | ICD-10-CM

## 2014-08-17 DIAGNOSIS — K047 Periapical abscess without sinus: Secondary | ICD-10-CM | POA: Diagnosis not present

## 2014-08-17 MED ORDER — HYDROCODONE-ACETAMINOPHEN 5-325 MG PO TABS: 2.0000 | ORAL_TABLET | Freq: Three times a day (TID) | ORAL | Status: DC

## 2014-08-17 MED ORDER — HYDROCODONE-ACETAMINOPHEN 5-325 MG PO TABS: 1.0000 | ORAL_TABLET | Freq: Three times a day (TID) | ORAL | Status: DC

## 2014-08-17 NOTE — Patient Instructions (Signed)
You need to see a dentist ASAP.  [facility] will serve customers on Tuesdays and Thursdays this fall. The clinic provides teeth cleaning, x-rays and exams. Appointments are required. All new patients need an initial appointment, which lasts about one hour. After the initial appointment, the appointments for cleanings are made at 8 a.m. or 1 p.m. Appointments for cleanings last about three hours.The cost is $5 for cleaning and $5 for a full series of x-rays.   To schedule an initial appointment, please contact Milton Ferguson at [phone], Ext. [phone].\ \ [place] fall courses available online\ [employer], through a partnership with [employer], will offer online continuing education courses this fall. The cost for online classes is $55. Courses begin on the second Wednesday of the month and run for six weeks.

## 2014-08-17 NOTE — Progress Notes (Signed)
Subjective: Here for several concerns.  Has had several recent ED visits.  3 weeks ago got styes in both eyes, then 2 wk ago, had stye in right eye.   Was given cream and medication for pain.  Went to ED for this was given medication.   Wednesday morning awoke with tooth ache.  Called 5 different dentists, couldn't get in, there was an insurance issue.  Friday morning right face swollen, tooth pain.   Went back to the ED Friday evening, had abscess of multiple teeth.  Was given IV antibiotics.  Abscess busted in mouth on Sunday.  Swelling has improved, pain is still there.  Can't see dentist til October 6th.   He is worried about insurance coverage given frequent ED use.  Needs additional pain medication for the tooth proble.   On high dose Clindamycin.    ROS as in subjective   Objective: Gen: wd, wn, nad Skin: warm, dry Oral: right upper molar with decay, lots of dental plaque throughout mouth, 2 upper front teeth with dark coloration suggesting damaged nerves to teeth Face: no swelling, nontender other than right face overlying upper teeth Eye: no obvious abnormality or stye   Assessment: Encounter Diagnoses  Name Primary?  . Dental abscess Yes  . Dental plaque   . Stye, unspecified laterality   . Need for prophylactic vaccination and inoculation against influenza    Plan: Dental abscess, plaque. Counseled on overuse of the emergency department, advise he call or come here first unless is truly an emergency.  Advised that the antibiotic he is on clindamycin as high risk, needs to see dentist ASAP., I gave him some other potential options for dental care, discussed the need to take care of his teeth in general brushing daily, flossing daily.  Last dental visit over 7 years ago.  Short-term pain medicine given.    Stye - resolved  Counseled on the influenza virus vaccine.  Vaccine information sheet given.  Influenza vaccine given after consent obtained.

## 2014-08-19 NOTE — ED Provider Notes (Signed)
Medical screening examination/treatment/procedure(s) were performed by non-physician practitioner and as supervising physician I was immediately available for consultation/collaboration.   EKG Interpretation None        Fredia Sorrow, MD 08/19/14 [phone]

## 2014-08-31 DIAGNOSIS — F319 Bipolar disorder, unspecified: Secondary | ICD-10-CM | POA: Diagnosis not present

## 2014-08-31 DIAGNOSIS — F410 Panic disorder [episodic paroxysmal anxiety] without agoraphobia: Secondary | ICD-10-CM | POA: Diagnosis not present

## 2014-09-06 ENCOUNTER — Encounter: Payer: Self-pay | Admitting: Medical

## 2014-11-19 DIAGNOSIS — F419 Anxiety disorder, unspecified: Secondary | ICD-10-CM | POA: Diagnosis not present

## 2014-11-19 DIAGNOSIS — F909 Attention-deficit hyperactivity disorder, unspecified type: Secondary | ICD-10-CM | POA: Diagnosis not present

## 2014-11-19 DIAGNOSIS — Z79899 Other long term (current) drug therapy: Secondary | ICD-10-CM | POA: Diagnosis not present

## 2014-11-19 DIAGNOSIS — F4310 Post-traumatic stress disorder, unspecified: Secondary | ICD-10-CM | POA: Diagnosis not present

## 2014-11-19 DIAGNOSIS — F319 Bipolar disorder, unspecified: Secondary | ICD-10-CM | POA: Diagnosis not present

## 2014-12-06 ENCOUNTER — Emergency Department (HOSPITAL_COMMUNITY)
Admission: EM | Admit: 2014-12-06 | Discharge: 2014-12-07 | Disposition: A | Discharge status: Home / Self Care | Payer: Medicare Other | Attending: Emergency Medicine | Admitting: Emergency Medicine

## 2014-12-06 ENCOUNTER — Encounter (HOSPITAL_COMMUNITY): Payer: Self-pay | Admitting: Emergency Medicine

## 2014-12-06 DIAGNOSIS — Z79899 Other long term (current) drug therapy: Secondary | ICD-10-CM | POA: Insufficient documentation

## 2014-12-06 DIAGNOSIS — Z86718 Personal history of other venous thrombosis and embolism: Secondary | ICD-10-CM | POA: Diagnosis not present

## 2014-12-06 DIAGNOSIS — Z008 Encounter for other general examination: Secondary | ICD-10-CM | POA: Diagnosis not present

## 2014-12-06 DIAGNOSIS — Z9981 Dependence on supplemental oxygen: Secondary | ICD-10-CM | POA: Insufficient documentation

## 2014-12-06 DIAGNOSIS — F419 Anxiety disorder, unspecified: Secondary | ICD-10-CM | POA: Diagnosis not present

## 2014-12-06 DIAGNOSIS — E119 Type 2 diabetes mellitus without complications: Secondary | ICD-10-CM | POA: Insufficient documentation

## 2014-12-06 DIAGNOSIS — G8929 Other chronic pain: Secondary | ICD-10-CM | POA: Insufficient documentation

## 2014-12-06 DIAGNOSIS — Z880 Allergy status to penicillin: Secondary | ICD-10-CM | POA: Diagnosis not present

## 2014-12-06 DIAGNOSIS — Z720 Tobacco use: Secondary | ICD-10-CM | POA: Insufficient documentation

## 2014-12-06 DIAGNOSIS — F909 Attention-deficit hyperactivity disorder, unspecified type: Secondary | ICD-10-CM | POA: Diagnosis not present

## 2014-12-06 DIAGNOSIS — Z8614 Personal history of Methicillin resistant Staphylococcus aureus infection: Secondary | ICD-10-CM | POA: Insufficient documentation

## 2014-12-06 DIAGNOSIS — F1410 Cocaine abuse, uncomplicated: Secondary | ICD-10-CM | POA: Diagnosis not present

## 2014-12-06 DIAGNOSIS — F310 Bipolar disorder, current episode hypomanic: Secondary | ICD-10-CM | POA: Diagnosis not present

## 2014-12-06 DIAGNOSIS — F1310 Sedative, hypnotic or anxiolytic abuse, uncomplicated: Secondary | ICD-10-CM | POA: Diagnosis not present

## 2014-12-06 DIAGNOSIS — I10 Essential (primary) hypertension: Secondary | ICD-10-CM | POA: Diagnosis not present

## 2014-12-06 DIAGNOSIS — F1510 Other stimulant abuse, uncomplicated: Secondary | ICD-10-CM | POA: Diagnosis not present

## 2014-12-06 LAB — CBC WITH DIFFERENTIAL/PLATELET
BASOS ABS: 0.1 10*3/uL (ref 0.0–0.1)
Basophils Relative: 0 % (ref 0–1)
EOS ABS: 0.3 10*3/uL (ref 0.0–0.7)
Eosinophils Relative: 2 % (ref 0–5)
HEMATOCRIT: 43 % (ref 39.0–52.0)
HEMOGLOBIN: 13.7 g/dL (ref 13.0–17.0)
Lymphocytes Relative: 18 % (ref 12–46)
Lymphs Abs: 2.7 10*3/uL (ref 0.7–4.0)
MCH: 29.3 pg (ref 26.0–34.0)
MCHC: 31.9 g/dL (ref 30.0–36.0)
MCV: 91.9 fL (ref 78.0–100.0)
MONOS PCT: 6 % (ref 3–12)
Monocytes Absolute: 0.9 10*3/uL (ref 0.1–1.0)
NEUTROS ABS: 11 10*3/uL — AB (ref 1.7–7.7)
Neutrophils Relative %: 74 % (ref 43–77)
Platelets: 418 10*3/uL — ABNORMAL HIGH (ref 150–400)
RBC: 4.68 MIL/uL (ref 4.22–5.81)
RDW: 13.4 % (ref 11.5–15.5)
WBC: 14.9 10*3/uL — ABNORMAL HIGH (ref 4.0–10.5)

## 2014-12-06 LAB — COMPREHENSIVE METABOLIC PANEL
ALK PHOS: 79 U/L (ref 39–117)
ALT: 18 U/L (ref 0–53)
AST: 22 U/L (ref 0–37)
Albumin: 3.9 g/dL (ref 3.5–5.2)
Anion gap: 8 (ref 5–15)
BILIRUBIN TOTAL: 0.5 mg/dL (ref 0.3–1.2)
BUN: 5 mg/dL — ABNORMAL LOW (ref 6–23)
CALCIUM: 9 mg/dL (ref 8.4–10.5)
CO2: 27 mmol/L (ref 19–32)
CREATININE: 0.8 mg/dL (ref 0.50–1.35)
Chloride: 105 mEq/L (ref 96–112)
GFR calc Af Amer: >90 mL/min (ref >90)
GFR calc non Af Amer: >90 mL/min (ref >90)
Glucose, Bld: 98 mg/dL (ref 70–99)
Potassium: 4.3 mmol/L (ref 3.5–5.1)
Sodium: 140 mmol/L (ref 135–145)
TOTAL PROTEIN: 7.4 g/dL (ref 6.0–8.3)

## 2014-12-06 LAB — ETHANOL: Alcohol, Ethyl (B): <5 mg/dL (ref 0–9)

## 2014-12-06 MED ORDER — PANTOPRAZOLE SODIUM 40 MG PO TBEC
40.0000 mg | DELAYED_RELEASE_TABLET | Freq: Every day | ORAL | Status: DC
  Administered 2014-12-06 – 2014-12-07 (×2): 40 mg via ORAL
  Filled 2014-12-06 (×2): qty 1

## 2014-12-06 MED ORDER — LORAZEPAM 1 MG PO TABS
2.0000 mg | ORAL_TABLET | Freq: Once | ORAL | Status: AC
  Administered 2014-12-06: 2 mg via ORAL
  Filled 2014-12-06: qty 2

## 2014-12-06 MED ORDER — IBUPROFEN 200 MG PO TABS
600.0000 mg | ORAL_TABLET | Freq: Three times a day (TID) | ORAL | Status: DC | PRN
  Administered 2014-12-07: 600 mg via ORAL
  Filled 2014-12-06: qty 3

## 2014-12-06 MED ORDER — LORAZEPAM 1 MG PO TABS
1.0000 mg | ORAL_TABLET | Freq: Three times a day (TID) | ORAL | Status: DC | PRN
  Administered 2014-12-07: 1 mg via ORAL
  Filled 2014-12-06: qty 1

## 2014-12-06 MED ORDER — ACETAMINOPHEN 325 MG PO TABS: 650.0000 mg | ORAL_TABLET | ORAL | Status: DC | PRN

## 2014-12-06 MED ORDER — ONDANSETRON HCL 4 MG PO TABS: 4.0000 mg | ORAL_TABLET | Freq: Three times a day (TID) | ORAL | Status: DC | PRN

## 2014-12-06 MED ORDER — LISDEXAMFETAMINE DIMESYLATE 30 MG PO CAPS
70.0000 mg | ORAL_CAPSULE | Freq: Every day | ORAL | Status: DC
  Administered 2014-12-07: 70 mg via ORAL
  Filled 2014-12-06: qty 2
  Filled 2014-12-06: qty 1

## 2014-12-06 MED ORDER — NICOTINE 21 MG/24HR TD PT24
21.0000 mg | MEDICATED_PATCH | Freq: Every day | TRANSDERMAL | Status: DC
  Administered 2014-12-07: 21 mg via TRANSDERMAL
  Filled 2014-12-06 (×2): qty 1
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
## 2014-12-06 NOTE — ED Notes (Signed)
Bed: [facility] Expected date:  Expected time:  Means of arrival:  Comments: Hold for triage 4

## 2014-12-06 NOTE — ED Notes (Signed)
Pt, being sent by Psych provider, c/o manic, paranoid, and psychotic behavior x "several days."  Hx of bipolar disorder.  Pt's wife reported that the Pt quit taking his medications over the weekend.

## 2014-12-07 DIAGNOSIS — F909 Attention-deficit hyperactivity disorder, unspecified type: Secondary | ICD-10-CM

## 2014-12-07 DIAGNOSIS — F310 Bipolar disorder, current episode hypomanic: Secondary | ICD-10-CM | POA: Diagnosis not present

## 2014-12-07 LAB — RAPID URINE DRUG SCREEN, HOSP PERFORMED
AMPHETAMINES: POSITIVE — AB
BARBITURATES: NOT DETECTED
Benzodiazepines: POSITIVE — AB
Cocaine: NOT DETECTED
Opiates: NOT DETECTED
Tetrahydrocannabinol: POSITIVE — AB

## 2014-12-07 NOTE — Consult Note (Signed)
[facility] Face-to-Face Psychiatry DISCHARGE NOTE  Reason for Consult:  Mood disorder Eric Hayes is an 39 y.o. male. Total Time spent with patient: 45 minutes  Assessment: DSM5 Bipolar I Disorder ADHD Panic Disorder without Agoraphobia  Past Medical History  Diagnosis Date  . Depression   . Anxiety   . Back pain, chronic     mid- and lower back  . Hyperlipidemia     no current med.  . Osteoarthritis   . Hypertension     no current med.  . Sleep apnea     uses CPAP nightly  . Diet-controlled diabetes mellitus   . Bipolar disorder   . History of DVT (deep vein thrombosis) 07/2010    right leg  . History of MRSA infection 2013    thigh    Plan:  Recommend psychiatric Inpatient admission when medically cleared.Already accepted at [place].  Subjective:   Eric Hayes is a 39 y.o. male patient admitted with ADHD, Bipolar 1 Disorder, Panic disorder   HPI:  Caucasian male, 39 years old was seen this am seeking treatment for his mood disorder; Bipolar 1, ADHD,and Panic disorder.  Patient reports that he stopped taking his MH medications for a couple of months because they made him too sleepy and tired.   Patient went to his out patient provider and called the Police from the office  because he believed somebody in the office had a gun.  Patient is asking for his medications to be rearranged so he can be able to take them as prescribed.  Patient denies SI/HI/AVH.  He will be transferred as soon as transportation is available.  HPI Elements:   Location:  Bi[polar disorder1, ADHD, Panic disorder withour Agoraphobia.. Quality:  severe, insomnia, depression, anxiety.. Severity:  severe. Timing:  acute. Duration:  Chronic mental illness. Context:  Seeking medication changes..  Past Psychiatric History: Past Medical History  Diagnosis Date  . Depression   . Anxiety   . Back pain, chronic     mid- and lower back  . Hyperlipidemia     no current med.  .  Osteoarthritis   . Hypertension     no current med.  . Sleep apnea     uses CPAP nightly  . Diet-controlled diabetes mellitus   . Bipolar disorder   . History of DVT (deep vein thrombosis) 07/2010    right leg  . History of MRSA infection 2013    thigh    reports that he has been smoking Cigarettes.  He has a 15 pack-year smoking history. He has never used smokeless tobacco. He reports that he drinks alcohol. He reports that he does not use illicit drugs. Family History  Problem Relation Age of Onset  . Diabetes Father   . Diabetes Maternal Uncle   . Diabetes Maternal Grandmother   . Heart disease Maternal Grandmother    Family History Substance Abuse: No Family Supports: Yes, List: Living Arrangements: Children, Spouse/significant other (wife, 3 step sons, 26 yo daughter) Can pt return to current living arrangement?: Yes Abuse/Neglect [facility]) Physical Abuse: Denies Verbal Abuse: Denies Sexual Abuse: Denies Allergies:   Allergies  Allergen Reactions  . Penicillins Hives and Swelling    AS A CHILD    ACT Assessment Complete:  Yes:    Educational Status    Risk to Self: Risk to self with the past 6 months Suicidal Ideation: No Suicidal Intent: No Is patient at risk for suicide?: No Suicidal Plan?: No Access to Means: No  What has been your use of drugs/alcohol within the last 12 months?: occasoinal THC and etoh use Previous Attempts/Gestures: Yes How many times?: 5 Other Self Harm Risks: none Triggers for Past Attempts: Unpredictable (pt sts last attempt was 2005) Intentional Self Injurious Behavior: None Family Suicide History: Unknown Recent stressful life event(s): Conflict (Comment), Other (Comment) (pt sts wife cheating on him, insomnia) Persecutory voices/beliefs?: No Depression: Yes Depression Symptoms: Insomnia (poor appetite) Substance abuse history and/or treatment for substance abuse?: Yes Suicide prevention information given to non-admitted patients: Not  applicable  Risk to Others: Risk to Others within the past 6 months Homicidal Ideation: No Thoughts of Harm to Others: No Current Homicidal Intent: No Current Homicidal Plan: No Access to Homicidal Means: No Identified Victim: none History of harm to others?: No Assessment of Violence: None Noted Violent Behavior Description: pt denies hx harm Does patient have access to weapons?: No Criminal Charges Pending?: No Does patient have a court date: No  Abuse: Abuse/Neglect Assessment (Assessment to be complete while patient is alone) Physical Abuse: Denies Verbal Abuse: Denies Sexual Abuse: Denies Exploitation of patient/patient's resources: Denies Self-Neglect: Denies  Prior Inpatient Therapy: Prior Inpatient Therapy Prior Inpatient Therapy: Yes Prior Therapy Dates: 2005 and 2003 Prior Therapy Facilty/Provider(s): [facility] [place] and [facility] Reason for Treatment: MDD, suicide attempts  Prior Outpatient Therapy: Prior Outpatient Therapy Prior Outpatient Therapy: Yes Prior Therapy Dates: currently Prior Therapy Facilty/Provider(s): [facility] Reason for Treatment: bipolar, ADHD, panic d/o  Additional Information: Additional Information 1:1 In Past 12 Months?: No CIRT Risk: No Elopement Risk: No Does patient have medical clearance?: Yes   Objective: Blood pressure 151/98, pulse 90, temperature 97.6 F (36.4 C), temperature source Oral, resp. rate 22, SpO2 100 %.There is no weight on file to calculate BMI. Results for orders placed or performed during the hospital encounter of 12/06/14 (from the past 72 hour(s))  Ethanol     Status: None   Collection Time: 12/06/14  4:47 PM  Result Value Ref Range   Alcohol, Ethyl (B) <5 0 - 9 mg/dL    Comment:        LOWEST DETECTABLE LIMIT FOR SERUM ALCOHOL IS 11 mg/dL FOR MEDICAL PURPOSES ONLY   CBC with Differential     Status: Abnormal   Collection Time: 12/06/14  4:47 PM  Result Value Ref Range   WBC 14.9 (H) 4.0 - 10.5 K/uL   RBC 4.68  4.22 - 5.81 MIL/uL   Hemoglobin 13.7 13.0 - 17.0 g/dL   HCT 43.0 39.0 - 52.0 %   MCV 91.9 78.0 - 100.0 fL   MCH 29.3 26.0 - 34.0 pg   MCHC 31.9 30.0 - 36.0 g/dL   RDW 13.4 11.5 - 15.5 %   Platelets 418 (H) 150 - 400 K/uL   Neutrophils Relative % 74 43 - 77 %   Neutro Abs 11.0 (H) 1.7 - 7.7 K/uL   Lymphocytes Relative 18 12 - 46 %   Lymphs Abs 2.7 0.7 - 4.0 K/uL   Monocytes Relative 6 3 - 12 %   Monocytes Absolute 0.9 0.1 - 1.0 K/uL   Eosinophils Relative 2 0 - 5 %   Eosinophils Absolute 0.3 0.0 - 0.7 K/uL   Basophils Relative 0 0 - 1 %   Basophils Absolute 0.1 0.0 - 0.1 K/uL  Comprehensive metabolic panel     Status: Abnormal   Collection Time: 12/06/14  4:47 PM  Result Value Ref Range   Sodium 140 135 - 145 mmol/L  Comment: Please note change in reference range.   Potassium 4.3 3.5 - 5.1 mmol/L    Comment: Please note change in reference range.   Chloride 105 96 - 112 mEq/L   CO2 27 19 - 32 mmol/L   Glucose, Bld 98 70 - 99 mg/dL   BUN 5 (L) 6 - 23 mg/dL   Creatinine, Ser 0.80 0.50 - 1.35 mg/dL   Calcium 9.0 8.4 - 10.5 mg/dL   Total Protein 7.4 6.0 - 8.3 g/dL   Albumin 3.9 3.5 - 5.2 g/dL   AST 22 0 - 37 U/L   ALT 18 0 - 53 U/L   Alkaline Phosphatase 79 39 - 117 U/L   Total Bilirubin 0.5 0.3 - 1.2 mg/dL   GFR calc non Af Amer >90 >90 mL/min   GFR calc Af Amer >90 >90 mL/min    Comment: (NOTE) The eGFR has been calculated using the CKD EPI equation. This calculation has not been validated in all clinical situations. eGFR's persistently <90 mL/min signify possible Chronic Kidney Disease.    Anion gap 8 5 - 15  Urine rapid drug screen (hosp performed)     Status: Abnormal   Collection Time: 12/07/14  6:20 AM  Result Value Ref Range   Opiates NONE DETECTED NONE DETECTED   Cocaine NONE DETECTED NONE DETECTED   Benzodiazepines POSITIVE (A) NONE DETECTED   Amphetamines POSITIVE (A) NONE DETECTED   Tetrahydrocannabinol POSITIVE (A) NONE DETECTED   Barbiturates NONE  DETECTED NONE DETECTED    Comment:        DRUG SCREEN FOR MEDICAL PURPOSES ONLY.  IF CONFIRMATION IS NEEDED FOR ANY PURPOSE, NOTIFY LAB WITHIN 5 DAYS.        LOWEST DETECTABLE LIMITS FOR URINE DRUG SCREEN Drug Class       Cutoff (ng/mL) Amphetamine      1000 Barbiturate      200 Benzodiazepine   025 Tricyclics       852 Opiates          300 Cocaine          300 THC              50    Labs are reviewed and are pertinent for UDS is positive for Amphetamines, Marijuana and Benzodiazepines.  Current Facility-Administered Medications  Medication Dose Route Frequency Provider Last Rate Last Dose  . acetaminophen (TYLENOL) tablet 650 mg  650 mg Oral Q4H PRN Leota Jacobsen, MD      . ibuprofen (ADVIL,MOTRIN) tablet 600 mg  600 mg Oral Q8H PRN Leota Jacobsen, MD      . lisdexamfetamine (VYVANSE) capsule 70 mg  70 mg Oral Daily Leota Jacobsen, MD   70 mg at 12/07/14 0932  . LORazepam (ATIVAN) tablet 1 mg  1 mg Oral Q8H PRN Leota Jacobsen, MD      . nicotine (NICODERM CQ - dosed in mg/24 hours) patch 21 mg  21 mg Transdermal Daily Leota Jacobsen, MD   21 mg at 12/07/14 0934  . ondansetron (ZOFRAN) tablet 4 mg  4 mg Oral Q8H PRN Leota Jacobsen, MD      . pantoprazole (PROTONIX) EC tablet 40 mg  40 mg Oral Daily Leota Jacobsen, MD   40 mg at 12/07/14 0932   Current Outpatient Prescriptions  Medication Sig Dispense Refill  . alprazolam (XANAX) 2 MG tablet Take 2 mg by mouth See admin instructions. 2 mg four times daily and 1 mg  at bedtime    . EQUETRO 300 MG CP12 Take 300 mg by mouth at bedtime.     Marland Kitchen HYDROcodone-acetaminophen (NORCO/VICODIN) 5-325 MG per tablet Take 2 tablets by mouth every 8 (eight) hours. (Patient taking differently: Take 1-2 tablets by mouth every 8 (eight) hours. ) 30 tablet 0  . lisdexamfetamine (VYVANSE) 70 MG capsule Take 70 mg by mouth daily.    Marland Kitchen omeprazole (PRILOSEC) 40 MG capsule Take 1 capsule (40 mg total) by mouth daily. 30 capsule 2  . risperidone  (RISPERDAL) 4 MG tablet Take 4 mg by mouth at bedtime.     . Azelastine-Fluticasone (DYMISTA) 137-50 MCG/ACT SUSP Place 2 sprays into the nose 2 (two) times daily. (Patient not taking: Reported on 12/06/2014) 23 g 2  . clindamycin (CLEOCIN) 150 MG capsule Take 2 capsules (300 mg total) by mouth 4 (four) times daily. (Patient not taking: Reported on 12/06/2014) 80 capsule 0  . montelukast (SINGULAIR) 10 MG tablet Take 1 tablet (10 mg total) by mouth at bedtime. (Patient not taking: Reported on 12/06/2014) 30 tablet 2    Psychiatric Specialty Exam:     Blood pressure 151/98, pulse 90, temperature 97.6 F (36.4 C), temperature source Oral, resp. rate 22, SpO2 100 %.There is no weight on file to calculate BMI.  General Appearance: Casual and Fairly Groomed  Eye Contact::  Good  Speech:  Clear and Coherent and Normal Rate  Volume:  Normal  Mood:  Anxious and Depressed  Affect:  Congruent, Depressed and Flat  Thought Process:  Coherent, Goal Directed and Intact  Orientation:  Full (Time, Place, and Person)  Thought Content:  WDL  Suicidal Thoughts:  No  Homicidal Thoughts:  No  Memory:  Immediate;   Good Recent;   Good Remote;   Good  Judgement:  Fair  Insight:  Good  Psychomotor Activity:  Normal  Concentration:  Good  Recall:  NA  Fund of Knowledge:Good  Language: Good  Akathisia:  NA  Handed:  Right  AIMS (if indicated):     Assets:  Desire for Improvement  Sleep:      Musculoskeletal: Strength & Muscle Tone: within normal limits Gait & Station: normal Patient leans: N/A  Treatment Plan Summary: Daily contact with patient to assess and evaluate symptoms and progress in treatment Medication management To be transferred to [facility] as soon as transportation is available.  Delfin Gant   PMHNP-BC 12/07/2014 11:34 AM  Patient seen, evaluated and I agree with notes by Nurse Practitioner. Corena Pilgrim, MD

## 2014-12-07 NOTE — ED Notes (Signed)
Call placed to [facility] for report. Awaiting callback from RN

## 2014-12-07 NOTE — ED Notes (Signed)
Patient continues to sleep at this time. Patient has been resting quietly with eyes closed, respirations equal and unlabored and skin warm and dry. Unable to assess patient due to patient sleeping the entire shift. Unable to ask patient about CPAP usage and unable to collect urine sample at this time. Will notify oncoming nurse that urine sample needed when patient arouse and also ask patient about CPAP usage. Will continue to monitor patient.

## 2014-12-07 NOTE — Consult Note (Signed)
[facility] Consult   Reason for Consult:  Mood disorder Referring Physician:  EDP Eric Hayes is an 39 y.o. male. Total Time spent with patient: 45 minutes  Assessment: DSM5 Bipolar I Disorder ADHD Panic Disorder without Agoraphobia  Past Medical History  Diagnosis Date  . Depression   . Anxiety   . Back pain, chronic     mid- and lower back  . Hyperlipidemia     no current med.  . Osteoarthritis   . Hypertension     no current med.  . Sleep apnea     uses CPAP nightly  . Diet-controlled diabetes mellitus   . Bipolar disorder   . History of DVT (deep vein thrombosis) 07/2010    right leg  . History of MRSA infection 2013    thigh    Plan:  Recommend psychiatric Inpatient admission when medically cleared.Already accepted at [facility].  Subjective:   Eric Hayes is a 39 y.o. male patient admitted with ADHD, Bipolar 1 Disorder, Panic disorder   HPI:  Caucasian male, 39 years old was seen this am seeking treatment for his mood disorder; Bipolar 1, ADHD,and Panic disorder.  Patient reports that he stopped taking his MH medications for a couple of months because they made him too sleepy and tired.   Patient went to his out patient provider and called the Police from the office  because he believed somebody in the office had a gun.  Patient is asking for his medications to be rearranged so he can be able to take them as prescribed.  Patient denies SI/HI/AVH.  He will be transferred as soon as transportation is available.  HPI Elements:   Location:  Bi[polar disorder1, ADHD, Panic disorder withour Agoraphobia.. Quality:  severe, insomnia, depression, anxiety.. Severity:  severe. Timing:  acute. Duration:  Chronic mental illness. Context:  Seeking medication changes..  Past Psychiatric History: Past Medical History  Diagnosis Date  . Depression   . Anxiety   . Back pain, chronic     mid- and lower back  . Hyperlipidemia     no current  med.  . Osteoarthritis   . Hypertension     no current med.  . Sleep apnea     uses CPAP nightly  . Diet-controlled diabetes mellitus   . Bipolar disorder   . History of DVT (deep vein thrombosis) 07/2010    right leg  . History of MRSA infection 2013    thigh    reports that he has been smoking Cigarettes.  He has a 15 pack-year smoking history. He has never used smokeless tobacco. He reports that he drinks alcohol. He reports that he does not use illicit drugs. Family History  Problem Relation Age of Onset  . Diabetes Father   . Diabetes Maternal Uncle   . Diabetes Maternal Grandmother   . Heart disease Maternal Grandmother    Family History Substance Abuse: No Family Supports: Yes, List: Living Arrangements: Children, Spouse/significant other (wife, 3 step sons, 19 yo daughter) Can pt return to current living arrangement?: Yes Abuse/Neglect [facility]) Physical Abuse: Denies Verbal Abuse: Denies Sexual Abuse: Denies Allergies:   Allergies  Allergen Reactions  . Penicillins Hives and Swelling    AS A CHILD    ACT Assessment Complete:  Yes:    Educational Status    Risk to Self: Risk to self with the past 6 months Suicidal Ideation: No Suicidal Intent: No Is patient at risk for suicide?: No Suicidal Plan?: No  Access to Means: No What has been your use of drugs/alcohol within the last 12 months?: occasoinal THC and etoh use Previous Attempts/Gestures: Yes How many times?: 5 Other Self Harm Risks: none Triggers for Past Attempts: Unpredictable (pt sts last attempt was 2005) Intentional Self Injurious Behavior: None Family Suicide History: Unknown Recent stressful life event(s): Conflict (Comment), Other (Comment) (pt sts wife cheating on him, insomnia) Persecutory voices/beliefs?: No Depression: Yes Depression Symptoms: Insomnia (poor appetite) Substance abuse history and/or treatment for substance abuse?: Yes Suicide prevention information given to non-admitted  patients: Not applicable  Risk to Others: Risk to Others within the past 6 months Homicidal Ideation: No Thoughts of Harm to Others: No Current Homicidal Intent: No Current Homicidal Plan: No Access to Homicidal Means: No Identified Victim: none History of harm to others?: No Assessment of Violence: None Noted Violent Behavior Description: pt denies hx harm Does patient have access to weapons?: No Criminal Charges Pending?: No Does patient have a court date: No  Abuse: Abuse/Neglect Assessment (Assessment to be complete while patient is alone) Physical Abuse: Denies Verbal Abuse: Denies Sexual Abuse: Denies Exploitation of patient/patient's resources: Denies Self-Neglect: Denies  Prior Inpatient Therapy: Prior Inpatient Therapy Prior Inpatient Therapy: Yes Prior Therapy Dates: 2005 and 2003 Prior Therapy Facilty/Provider(s): [facility] [place] and [facility] Reason for Treatment: MDD, suicide attempts  Prior Outpatient Therapy: Prior Outpatient Therapy Prior Outpatient Therapy: Yes Prior Therapy Dates: currently Prior Therapy Facilty/Provider(s): [facility] Reason for Treatment: bipolar, ADHD, panic d/o  Additional Information: Additional Information 1:1 In Past 12 Months?: No CIRT Risk: No Elopement Risk: No Does patient have medical clearance?: Yes   Objective: Blood pressure 151/98, pulse 90, temperature 97.6 F (36.4 C), temperature source Oral, resp. rate 22, SpO2 100 %.There is no weight on file to calculate BMI. Results for orders placed or performed during the hospital encounter of 12/06/14 (from the past 72 hour(s))  Ethanol     Status: None   Collection Time: 12/06/14  4:47 PM  Result Value Ref Range   Alcohol, Ethyl (B) <5 0 - 9 mg/dL    Comment:        LOWEST DETECTABLE LIMIT FOR SERUM ALCOHOL IS 11 mg/dL FOR MEDICAL PURPOSES ONLY   CBC with Differential     Status: Abnormal   Collection Time: 12/06/14  4:47 PM  Result Value Ref Range   WBC 14.9 (H) 4.0 - 10.5 K/uL    RBC 4.68 4.22 - 5.81 MIL/uL   Hemoglobin 13.7 13.0 - 17.0 g/dL   HCT 43.0 39.0 - 52.0 %   MCV 91.9 78.0 - 100.0 fL   MCH 29.3 26.0 - 34.0 pg   MCHC 31.9 30.0 - 36.0 g/dL   RDW 13.4 11.5 - 15.5 %   Platelets 418 (H) 150 - 400 K/uL   Neutrophils Relative % 74 43 - 77 %   Neutro Abs 11.0 (H) 1.7 - 7.7 K/uL   Lymphocytes Relative 18 12 - 46 %   Lymphs Abs 2.7 0.7 - 4.0 K/uL   Monocytes Relative 6 3 - 12 %   Monocytes Absolute 0.9 0.1 - 1.0 K/uL   Eosinophils Relative 2 0 - 5 %   Eosinophils Absolute 0.3 0.0 - 0.7 K/uL   Basophils Relative 0 0 - 1 %   Basophils Absolute 0.1 0.0 - 0.1 K/uL  Comprehensive metabolic panel     Status: Abnormal   Collection Time: 12/06/14  4:47 PM  Result Value Ref Range   Sodium 140 135 -  145 mmol/L    Comment: Please note change in reference range.   Potassium 4.3 3.5 - 5.1 mmol/L    Comment: Please note change in reference range.   Chloride 105 96 - 112 mEq/L   CO2 27 19 - 32 mmol/L   Glucose, Bld 98 70 - 99 mg/dL   BUN 5 (L) 6 - 23 mg/dL   Creatinine, Ser 0.80 0.50 - 1.35 mg/dL   Calcium 9.0 8.4 - 10.5 mg/dL   Total Protein 7.4 6.0 - 8.3 g/dL   Albumin 3.9 3.5 - 5.2 g/dL   AST 22 0 - 37 U/L   ALT 18 0 - 53 U/L   Alkaline Phosphatase 79 39 - 117 U/L   Total Bilirubin 0.5 0.3 - 1.2 mg/dL   GFR calc non Af Amer >90 >90 mL/min   GFR calc Af Amer >90 >90 mL/min    Comment: (NOTE) The eGFR has been calculated using the CKD EPI equation. This calculation has not been validated in all clinical situations. eGFR's persistently <90 mL/min signify possible Chronic Kidney Disease.    Anion gap 8 5 - 15  Urine rapid drug screen (hosp performed)     Status: Abnormal   Collection Time: 12/07/14  6:20 AM  Result Value Ref Range   Opiates NONE DETECTED NONE DETECTED   Cocaine NONE DETECTED NONE DETECTED   Benzodiazepines POSITIVE (A) NONE DETECTED   Amphetamines POSITIVE (A) NONE DETECTED   Tetrahydrocannabinol POSITIVE (A) NONE DETECTED    Barbiturates NONE DETECTED NONE DETECTED    Comment:        DRUG SCREEN FOR MEDICAL PURPOSES ONLY.  IF CONFIRMATION IS NEEDED FOR ANY PURPOSE, NOTIFY LAB WITHIN 5 DAYS.        LOWEST DETECTABLE LIMITS FOR URINE DRUG SCREEN Drug Class       Cutoff (ng/mL) Amphetamine      1000 Barbiturate      200 Benzodiazepine   119 Tricyclics       147 Opiates          300 Cocaine          300 THC              50    Labs are reviewed and are pertinent for UDS is positive for Amphetamines, Marijuana and Benzodiazepines.  Current Facility-Administered Medications  Medication Dose Route Frequency Provider Last Rate Last Dose  . acetaminophen (TYLENOL) tablet 650 mg  650 mg Oral Q4H PRN Leota Jacobsen, MD      . ibuprofen (ADVIL,MOTRIN) tablet 600 mg  600 mg Oral Q8H PRN Leota Jacobsen, MD      . lisdexamfetamine (VYVANSE) capsule 70 mg  70 mg Oral Daily Leota Jacobsen, MD   70 mg at 12/07/14 0932  . LORazepam (ATIVAN) tablet 1 mg  1 mg Oral Q8H PRN Leota Jacobsen, MD      . nicotine (NICODERM CQ - dosed in mg/24 hours) patch 21 mg  21 mg Transdermal Daily Leota Jacobsen, MD   21 mg at 12/07/14 0934  . ondansetron (ZOFRAN) tablet 4 mg  4 mg Oral Q8H PRN Leota Jacobsen, MD      . pantoprazole (PROTONIX) EC tablet 40 mg  40 mg Oral Daily Leota Jacobsen, MD   40 mg at 12/07/14 0932   Current Outpatient Prescriptions  Medication Sig Dispense Refill  . alprazolam (XANAX) 2 MG tablet Take 2 mg by mouth See admin instructions. 2 mg four  times daily and 1 mg at bedtime    . EQUETRO 300 MG CP12 Take 300 mg by mouth at bedtime.     Marland Kitchen HYDROcodone-acetaminophen (NORCO/VICODIN) 5-325 MG per tablet Take 2 tablets by mouth every 8 (eight) hours. (Patient taking differently: Take 1-2 tablets by mouth every 8 (eight) hours. ) 30 tablet 0  . lisdexamfetamine (VYVANSE) 70 MG capsule Take 70 mg by mouth daily.    Marland Kitchen omeprazole (PRILOSEC) 40 MG capsule Take 1 capsule (40 mg total) by mouth daily. 30 capsule 2  .  risperidone (RISPERDAL) 4 MG tablet Take 4 mg by mouth at bedtime.     . Azelastine-Fluticasone (DYMISTA) 137-50 MCG/ACT SUSP Place 2 sprays into the nose 2 (two) times daily. (Patient not taking: Reported on 12/06/2014) 23 g 2  . clindamycin (CLEOCIN) 150 MG capsule Take 2 capsules (300 mg total) by mouth 4 (four) times daily. (Patient not taking: Reported on 12/06/2014) 80 capsule 0  . montelukast (SINGULAIR) 10 MG tablet Take 1 tablet (10 mg total) by mouth at bedtime. (Patient not taking: Reported on 12/06/2014) 30 tablet 2    Psychiatric Specialty Exam:     Blood pressure 151/98, pulse 90, temperature 97.6 F (36.4 C), temperature source Oral, resp. rate 22, SpO2 100 %.There is no weight on file to calculate BMI.  General Appearance: Casual and Fairly Groomed  Eye Contact::  Good  Speech:  Clear and Coherent and Normal Rate  Volume:  Normal  Mood:  Anxious and Depressed  Affect:  Congruent, Depressed and Flat  Thought Process:  Coherent, Goal Directed and Intact  Orientation:  Full (Time, Place, and Person)  Thought Content:  WDL  Suicidal Thoughts:  No  Homicidal Thoughts:  No  Memory:  Immediate;   Good Recent;   Good Remote;   Good  Judgement:  Fair  Insight:  Good  Psychomotor Activity:  Normal  Concentration:  Good  Recall:  NA  Fund of Knowledge:Good  Language: Good  Akathisia:  NA  Handed:  Right  AIMS (if indicated):     Assets:  Desire for Improvement  Sleep:      Musculoskeletal: Strength & Muscle Tone: within normal limits Gait & Station: normal Patient leans: N/A  Treatment Plan Summary: Daily contact with patient to assess and evaluate symptoms and progress in treatment Medication management To be transferred to [facility] as soon as transportation is available.  Delfin Gant   PMHNP-BC 12/07/2014 10:44 AM  Patient seen, evaluated and I agree with notes by Nurse Practitioner. Corena Pilgrim, MD

## 2014-12-07 NOTE — BH Assessment (Signed)
Provided additional information to [place] at [facility]. RN will check with pt if he has CPAP machine that could be brought to ED to be used if accepted at [facility], and will collect UDS.   Acceptance pending verification of CPAP machine use, if uses must bring with him.  Will hold a bed for him. Helene Kelp will call back between 6:30 and 6:45.   Lear Ng, [facility] Triage Specialist 12/07/2014 2:34 AM

## 2014-12-07 NOTE — BH Assessment (Signed)
[place] Assessment Progress Note   Helene Kelp from [facility] called at 06:19 to say that patient has been accepted to their facility.  Dr. Juanita Craver will be the attending doctor.  Patient will go to [facility].  They can take him after 10:00.  Nurse call report number is [phone].  This clinician spoke with patient and informed him that he was accepted for voluntary admission to [facility].  When asked if he was willing to sign in voluntarily at [facility] he nods and says "Oh yes that is not a problem."

## 2014-12-07 NOTE — ED Notes (Signed)
Patient reports that he has not had or  use CPAP machine in 2 years. Beverely Low TTS counselor informed of CPAP usage and urine sample collected and sent to lab.

## 2014-12-07 NOTE — BH Assessment (Signed)
Per Amy pt has been declined at [facility] due to needing a higher level of care.   Lear Ng, [facility] Triage Specialist 12/07/2014 5:45 AM

## 2014-12-07 NOTE — ED Notes (Signed)
Patient was informed by Eric Hayes couns that he was accepted at [facility].

## 2014-12-07 NOTE — BH Assessment (Signed)
Per Amy at [facility] pt is being considered, she requests nursing notes be faxed.   Lear Ng, [facility] Triage Specialist 12/07/2014 1:05 AM

## 2014-12-08 DIAGNOSIS — F312 Bipolar disorder, current episode manic severe with psychotic features: Secondary | ICD-10-CM | POA: Diagnosis not present

## 2014-12-09 DIAGNOSIS — F312 Bipolar disorder, current episode manic severe with psychotic features: Secondary | ICD-10-CM | POA: Diagnosis not present

## 2014-12-14 ENCOUNTER — Ambulatory Visit: Payer: Medicare Other | Admitting: Medical

## 2014-12-14 ENCOUNTER — Encounter: Payer: Self-pay | Admitting: Medical

## 2014-12-14 ENCOUNTER — Ambulatory Visit (INDEPENDENT_AMBULATORY_CARE_PROVIDER_SITE_OTHER): Payer: Medicare Other | Admitting: Medical

## 2014-12-14 VITALS — BP 130/70 | HR 106 | Temp 97.7°F | Resp 18 | Wt 267.0 lb

## 2014-12-14 DIAGNOSIS — N3000 Acute cystitis without hematuria: Secondary | ICD-10-CM

## 2014-12-14 DIAGNOSIS — F312 Bipolar disorder, current episode manic severe with psychotic features: Secondary | ICD-10-CM | POA: Diagnosis not present

## 2014-12-14 DIAGNOSIS — Z113 Encounter for screening for infections with a predominantly sexual mode of transmission: Secondary | ICD-10-CM

## 2014-12-14 DIAGNOSIS — N451 Epididymitis: Secondary | ICD-10-CM | POA: Diagnosis not present

## 2014-12-14 DIAGNOSIS — Z202 Contact with and (suspected) exposure to infections with a predominantly sexual mode of transmission: Secondary | ICD-10-CM

## 2014-12-14 LAB — POCT URINALYSIS DIPSTICK
Bilirubin, UA: NEGATIVE
Blood, UA: NEGATIVE
Glucose, UA: NEGATIVE
Ketones, UA: NEGATIVE
Leukocytes, UA: NEGATIVE
Nitrite, UA: NEGATIVE
PH UA: 7
PROTEIN UA: NEGATIVE
Spec Grav, UA: 1.015
UROBILINOGEN UA: NEGATIVE

## 2014-12-14 LAB — RPR

## 2014-12-14 MED ORDER — CEFTRIAXONE SODIUM 500 MG IJ SOLR
250.0000 mg | Freq: Once | INTRAMUSCULAR | Status: AC
  Administered 2014-12-14: 250 mg via INTRAMUSCULAR

## 2014-12-14 MED ORDER — KETOROLAC TROMETHAMINE 60 MG/2ML IM SOLN
60.0000 mg | Freq: Once | INTRAMUSCULAR | Status: AC
  Administered 2014-12-14: 60 mg via INTRAMUSCULAR

## 2014-12-14 NOTE — Progress Notes (Signed)
Subjective: Here for c/o groin pain.  He reports that he just got out of hospital this morning.  Was hospitalized at [facility] in [place] for bipolar and manic episode.  Had medications adjusted.   This started when he hadn't slept for 5 days straight which led to manic episode. Attended group therapy while there, doing fine now.  Has follow-up planned with psychiatry.  Here today for right groin pain.  Few days ago awoke with groin pain in right groin.  Feels like hot poker inside and vice grips pulling.  No testicle swelling.   No penile discharge.  Actually he says he is on antibiotic currently/Bactrim for urinary tract infection based on urine results at the hospital. Has only taken this for 2 days. Right testicle feels swollen. Currently having some difficulty passing urine. No abdominal pain, no penile discharge, no fever.  Married, no other sexual partner, but he and wife are having some issues, not sure about STD exposure so would like STD testing.  No other aggravating or relieving factors. No other complaint.  ROS as in subjective    Objective: BP 130/70 mmHg  Pulse 106  Temp(Src) 97.7 F (36.5 C) (Oral)  Resp 18  Wt 267 lb (121.11 kg)  Gen: wd, wn, nad Skin: Unremarkable There is swelling and tenderness over the right epididymis, tender in the right testicle as well, otherwise scrotum nontender, no other deformity, no lymphadenopathy, no rash rested GU exam unremarkable Abdomen nontender, no mass no organomegaly, right lower quadrant surgical scar present Psych: Pleasant, good eye contact, answers questions appropriately   Assessment: Encounter Diagnoses  Name Primary?  . Acute cystitis without hematuria Yes  . Epididymitis   . Venereal disease contact   . Screen for STD (sexually transmitted disease)      Plan: Gave 250 mg Rocephin injection IM today and office. Discussed risk and benefits including his allergy to penicillin although he says he is taking Keflex  before without problems.  Labs today for Chlamydia and gonorrhea.  For now continue Bactrim but may end up switching this. Can use scrotal elevation, wear briefs instead of boxers, and we will call lab results. Follow-up with psychiatry as planned

## 2014-12-15 ENCOUNTER — Other Ambulatory Visit: Payer: Self-pay | Admitting: Medical

## 2014-12-15 LAB — GC/CHLAMYDIA PROBE AMP
CT PROBE, AMP APTIMA: NEGATIVE
GC PROBE AMP APTIMA: NEGATIVE

## 2014-12-15 MED ORDER — CIPROFLOXACIN HCL 500 MG PO TABS: 500.0000 mg | ORAL_TABLET | Freq: Two times a day (BID) | ORAL | Status: DC

## 2014-12-16 ENCOUNTER — Other Ambulatory Visit: Payer: Self-pay | Admitting: Medical

## 2014-12-16 ENCOUNTER — Telehealth: Payer: Self-pay

## 2014-12-16 DIAGNOSIS — E119 Type 2 diabetes mellitus without complications: Secondary | ICD-10-CM | POA: Diagnosis not present

## 2014-12-16 MED ORDER — NAPROXEN 500 MG PO TABS: 500.0000 mg | ORAL_TABLET | Freq: Two times a day (BID) | ORAL | Status: DC

## 2014-12-16 NOTE — Telephone Encounter (Signed)
Patient called states scrotum is still hurting, requesting tylenol #3, Audelia Acton declines, states patient needs to use prescriptions provided yesterday, use ice packs on area, wear tight boxers, use antibiotic and call back if symptoms worsen or do not improve.

## 2014-12-20 DIAGNOSIS — F410 Panic disorder [episodic paroxysmal anxiety] without agoraphobia: Secondary | ICD-10-CM | POA: Diagnosis not present

## 2014-12-20 DIAGNOSIS — F319 Bipolar disorder, unspecified: Secondary | ICD-10-CM | POA: Diagnosis not present

## 2015-01-05 ENCOUNTER — Telehealth: Payer: Self-pay | Admitting: Medical

## 2015-01-05 NOTE — Telephone Encounter (Signed)
Where is this doctor located, would he not want to go back to the orthopedic offices he has seen prior that do back injections?

## 2015-01-05 NOTE — Telephone Encounter (Signed)
Requesting referral to Dr Gean Birchwood ASAP to get injections in his back

## 2015-01-05 NOTE — Telephone Encounter (Signed)
He said this doctor is located near where the back surgeon is located the one we referred him too. He said he doesn't want to go back to the orthopaedic doctor he would rather go to the pain clinic. This doctor is at a pain clinic.

## 2015-01-06 NOTE — Telephone Encounter (Signed)
Patient called back and says he wants to see Dr Vira Blanco at the Pain Clinic?

## 2015-01-06 NOTE — Telephone Encounter (Signed)
I would call the new doctor first to find out what they need from [place] for the referral.  I feel like I have referred him to 3-4 back people over the years.   We can't keep doctor hopping.

## 2015-01-06 NOTE — Telephone Encounter (Signed)
This is not urgent.  Have Andria Frames call on Friday or Monday

## 2015-01-06 NOTE — Telephone Encounter (Signed)
I called Eric Hayes and he wants to see a doctor, D? He doesn't know his name. His phone cut off and I am unsure of who I am to be referring him to. Can you advise?

## 2015-01-07 NOTE — Telephone Encounter (Signed)
I fax over the [facility]'s [facility] and they will contact the patient for his appointment. [address] [place], [place]

## 2015-01-23 ENCOUNTER — Emergency Department (HOSPITAL_COMMUNITY)
Admission: EM | Admit: 2015-01-23 | Discharge: 2015-01-24 | Disposition: A | Discharge status: Home / Self Care | Payer: Medicare Other | Attending: Emergency Medicine | Admitting: Emergency Medicine

## 2015-01-23 ENCOUNTER — Encounter (HOSPITAL_COMMUNITY): Payer: Self-pay

## 2015-01-23 ENCOUNTER — Emergency Department (HOSPITAL_COMMUNITY): Payer: Medicare Other

## 2015-01-23 DIAGNOSIS — S92512A Displaced fracture of proximal phalanx of left lesser toe(s), initial encounter for closed fracture: Secondary | ICD-10-CM | POA: Diagnosis not present

## 2015-01-23 DIAGNOSIS — F329 Major depressive disorder, single episode, unspecified: Secondary | ICD-10-CM | POA: Insufficient documentation

## 2015-01-23 DIAGNOSIS — Y9289 Other specified places as the place of occurrence of the external cause: Secondary | ICD-10-CM | POA: Diagnosis not present

## 2015-01-23 DIAGNOSIS — Z880 Allergy status to penicillin: Secondary | ICD-10-CM | POA: Insufficient documentation

## 2015-01-23 DIAGNOSIS — F419 Anxiety disorder, unspecified: Secondary | ICD-10-CM | POA: Diagnosis not present

## 2015-01-23 DIAGNOSIS — S99921A Unspecified injury of right foot, initial encounter: Secondary | ICD-10-CM | POA: Diagnosis present

## 2015-01-23 DIAGNOSIS — S91115A Laceration without foreign body of left lesser toe(s) without damage to nail, initial encounter: Secondary | ICD-10-CM | POA: Diagnosis not present

## 2015-01-23 DIAGNOSIS — Z792 Long term (current) use of antibiotics: Secondary | ICD-10-CM | POA: Insufficient documentation

## 2015-01-23 DIAGNOSIS — Y9389 Activity, other specified: Secondary | ICD-10-CM | POA: Insufficient documentation

## 2015-01-23 DIAGNOSIS — Y998 Other external cause status: Secondary | ICD-10-CM | POA: Insufficient documentation

## 2015-01-23 DIAGNOSIS — S92592B Other fracture of left lesser toe(s), initial encounter for open fracture: Secondary | ICD-10-CM | POA: Diagnosis not present

## 2015-01-23 DIAGNOSIS — Z86718 Personal history of other venous thrombosis and embolism: Secondary | ICD-10-CM | POA: Diagnosis not present

## 2015-01-23 DIAGNOSIS — Z9981 Dependence on supplemental oxygen: Secondary | ICD-10-CM | POA: Insufficient documentation

## 2015-01-23 DIAGNOSIS — Z79899 Other long term (current) drug therapy: Secondary | ICD-10-CM | POA: Diagnosis not present

## 2015-01-23 DIAGNOSIS — E119 Type 2 diabetes mellitus without complications: Secondary | ICD-10-CM | POA: Diagnosis not present

## 2015-01-23 DIAGNOSIS — I10 Essential (primary) hypertension: Secondary | ICD-10-CM | POA: Insufficient documentation

## 2015-01-23 DIAGNOSIS — M1990 Unspecified osteoarthritis, unspecified site: Secondary | ICD-10-CM | POA: Diagnosis not present

## 2015-01-23 DIAGNOSIS — Z791 Long term (current) use of non-steroidal anti-inflammatories (NSAID): Secondary | ICD-10-CM | POA: Diagnosis not present

## 2015-01-23 DIAGNOSIS — Z720 Tobacco use: Secondary | ICD-10-CM | POA: Diagnosis not present

## 2015-01-23 DIAGNOSIS — S92912B Unspecified fracture of left toe(s), initial encounter for open fracture: Secondary | ICD-10-CM

## 2015-01-23 DIAGNOSIS — S92515B Nondisplaced fracture of proximal phalanx of left lesser toe(s), initial encounter for open fracture: Secondary | ICD-10-CM | POA: Insufficient documentation

## 2015-01-23 DIAGNOSIS — Z8614 Personal history of Methicillin resistant Staphylococcus aureus infection: Secondary | ICD-10-CM | POA: Diagnosis not present

## 2015-01-23 DIAGNOSIS — X58XXXA Exposure to other specified factors, initial encounter: Secondary | ICD-10-CM | POA: Diagnosis not present

## 2015-01-23 DIAGNOSIS — R58 Hemorrhage, not elsewhere classified: Secondary | ICD-10-CM | POA: Diagnosis not present

## 2015-01-23 MED ORDER — TETANUS-DIPHTH-ACELL PERTUSSIS 5-2.5-18.5 LF-MCG/0.5 IM SUSP
0.5000 mL | Freq: Once | INTRAMUSCULAR | Status: AC
  Administered 2015-01-23: 0.5 mL via INTRAMUSCULAR
  Filled 2015-01-23: qty 0.5

## 2015-01-23 MED ORDER — FENTANYL CITRATE 0.05 MG/ML IJ SOLN
100.0000 ug | Freq: Once | INTRAMUSCULAR | Status: AC
  Administered 2015-01-23: 100 ug via INTRAVENOUS
  Filled 2015-01-23: qty 2

## 2015-01-23 MED ORDER — CEFAZOLIN SODIUM 1-5 GM-% IV SOLN
1.0000 g | Freq: Once | INTRAVENOUS | Status: AC
  Administered 2015-01-23: 1 g via INTRAVENOUS
  Filled 2015-01-23: qty 50
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
## 2015-01-23 NOTE — ED Notes (Signed)
I got up out of bed this morning and fell, could not move my foot. It was bleeding. My wife bandaged it up and it was started bleeding tonight when my wife took the bandage off per pt.

## 2015-01-24 ENCOUNTER — Telehealth: Payer: Self-pay | Admitting: Internal Medicine

## 2015-01-24 ENCOUNTER — Telehealth: Payer: Self-pay | Admitting: Orthopedic Surgery

## 2015-01-24 DIAGNOSIS — S92515B Nondisplaced fracture of proximal phalanx of left lesser toe(s), initial encounter for open fracture: Secondary | ICD-10-CM | POA: Diagnosis not present

## 2015-01-24 MED ORDER — HYDROCODONE-ACETAMINOPHEN 5-325 MG PO TABS: 1.0000 | ORAL_TABLET | Freq: Four times a day (QID) | ORAL | Status: DC | PRN

## 2015-01-24 MED ORDER — CEPHALEXIN 500 MG PO CAPS: 500.0000 mg | ORAL_CAPSULE | Freq: Four times a day (QID) | ORAL | Status: DC

## 2015-01-24 MED ORDER — HYDROCODONE-ACETAMINOPHEN 5-325 MG PO TABS
1.0000 | ORAL_TABLET | Freq: Once | ORAL | Status: AC
  Administered 2015-01-24: 1 via ORAL
  Filled 2015-01-24: qty 1

## 2015-01-24 NOTE — Telephone Encounter (Signed)
[facility] is requesting a referral for this patient for problem of fracture of left foot. Pt insurance is medicare primary, Medicaid secondary. Please advise of authorization

## 2015-01-24 NOTE — Telephone Encounter (Signed)
Patient called this morning requesting appointment for problem of fracture of left foot(toe), per Emergency Room visit 01/23/15.  Offered appointment upon receipt of referral, per his secondary insurance requirement.  I faxed a request to primary care, Dr. Glade Lloyd at [facility] to request authorization for appointment; patient aware.  His ph# is [phone].

## 2015-01-24 NOTE — ED Notes (Signed)
Discharge instructions given, pt demonstrated teach back and verbal understanding. No concerns voiced.  

## 2015-01-25 NOTE — Telephone Encounter (Signed)
I fax over the NPI number to fax # [phone]

## 2015-01-25 NOTE — Telephone Encounter (Signed)
Called back to patient to offer appointment tomorrow, 01/26/15, in slot opened 9:00am - tried both [phone], left message, and also [phone] - no voice mail set up.

## 2015-01-25 NOTE — Telephone Encounter (Signed)
Reached patient; appointment scheduled; referral received.  Patient aware.

## 2015-01-26 ENCOUNTER — Ambulatory Visit (INDEPENDENT_AMBULATORY_CARE_PROVIDER_SITE_OTHER): Payer: Medicare Other | Admitting: Orthopedic Surgery

## 2015-01-26 ENCOUNTER — Encounter: Payer: Self-pay | Admitting: Orthopedic Surgery

## 2015-01-26 VITALS — BP 126/86 | Ht 76.0 in | Wt 270.0 lb

## 2015-01-26 DIAGNOSIS — S92515A Nondisplaced fracture of proximal phalanx of left lesser toe(s), initial encounter for closed fracture: Secondary | ICD-10-CM | POA: Diagnosis not present

## 2015-01-26 MED ORDER — HYDROCODONE-ACETAMINOPHEN 7.5-325 MG PO TABS: 1.0000 | ORAL_TABLET | ORAL | Status: DC | PRN
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
## 2015-01-26 NOTE — Progress Notes (Deleted)
   Subjective:    Patient ID: Eric Hayes, male    DOB: June 04, 1976, 39 y.o.   MRN: [id number]  HPI    Review of Systems     Objective:   Physical Exam        Assessment & Plan:

## 2015-01-31 ENCOUNTER — Ambulatory Visit (INDEPENDENT_AMBULATORY_CARE_PROVIDER_SITE_OTHER): Payer: Self-pay | Admitting: Orthopedic Surgery

## 2015-01-31 ENCOUNTER — Encounter: Payer: Self-pay | Admitting: Orthopedic Surgery

## 2015-01-31 VITALS — Ht 76.0 in | Wt 270.0 lb

## 2015-01-31 DIAGNOSIS — S92515A Nondisplaced fracture of proximal phalanx of left lesser toe(s), initial encounter for closed fracture: Secondary | ICD-10-CM

## 2015-01-31 MED ORDER — HYDROCODONE-ACETAMINOPHEN 7.5-325 MG PO TABS: 1.0000 | ORAL_TABLET | ORAL | Status: DC | PRN

## 2015-01-31 NOTE — Progress Notes (Signed)
Chief Complaint  Patient presents with  . Follow-up    dressing change left 5th toe, DOI 01/23/15   The patient has 2 wounds over his left foot both look good both dressings are changed rewrapped with iodoform gauze and sterile regular gauze followed by Ace wrap  Follow-up 1 week change dressing. Antibiotics finished doesn't need anymore there are no signs of infection. Continue current medication for pain continue postop shoe

## 2015-02-01 DIAGNOSIS — M488X6 Other specified spondylopathies, lumbar region: Secondary | ICD-10-CM | POA: Diagnosis not present

## 2015-02-01 DIAGNOSIS — Z79891 Long term (current) use of opiate analgesic: Secondary | ICD-10-CM | POA: Diagnosis not present

## 2015-02-01 DIAGNOSIS — G894 Chronic pain syndrome: Secondary | ICD-10-CM | POA: Diagnosis not present

## 2015-02-01 DIAGNOSIS — Z79899 Other long term (current) drug therapy: Secondary | ICD-10-CM | POA: Diagnosis not present

## 2015-02-01 DIAGNOSIS — M549 Dorsalgia, unspecified: Secondary | ICD-10-CM | POA: Diagnosis not present

## 2015-02-07 ENCOUNTER — Encounter: Payer: Self-pay | Admitting: Orthopedic Surgery

## 2015-02-07 ENCOUNTER — Ambulatory Visit: Payer: Medicare Other | Admitting: Orthopedic Surgery

## 2015-02-08 DIAGNOSIS — Z79899 Other long term (current) drug therapy: Secondary | ICD-10-CM | POA: Diagnosis not present

## 2015-02-08 DIAGNOSIS — M488X6 Other specified spondylopathies, lumbar region: Secondary | ICD-10-CM | POA: Diagnosis not present

## 2015-02-08 DIAGNOSIS — G894 Chronic pain syndrome: Secondary | ICD-10-CM | POA: Diagnosis not present

## 2015-02-08 DIAGNOSIS — M549 Dorsalgia, unspecified: Secondary | ICD-10-CM | POA: Diagnosis not present

## 2015-02-09 LAB — HM DIABETES EYE EXAM

## 2015-02-10 DIAGNOSIS — M545 Low back pain: Secondary | ICD-10-CM | POA: Diagnosis not present

## 2015-02-10 DIAGNOSIS — R200 Anesthesia of skin: Secondary | ICD-10-CM | POA: Diagnosis not present

## 2015-02-11 ENCOUNTER — Encounter: Payer: Self-pay | Admitting: Internal Medicine

## 2015-02-11 DIAGNOSIS — F909 Attention-deficit hyperactivity disorder, unspecified type: Secondary | ICD-10-CM | POA: Diagnosis not present

## 2015-02-19 ENCOUNTER — Encounter (HOSPITAL_COMMUNITY): Payer: Self-pay | Admitting: Emergency Medicine

## 2015-02-19 ENCOUNTER — Emergency Department (HOSPITAL_COMMUNITY)
Admission: EM | Admit: 2015-02-19 | Discharge: 2015-02-19 | Disposition: A | Discharge status: Home / Self Care | Payer: Medicare Other | Attending: Emergency Medicine | Admitting: Emergency Medicine

## 2015-02-19 DIAGNOSIS — I10 Essential (primary) hypertension: Secondary | ICD-10-CM | POA: Diagnosis not present

## 2015-02-19 DIAGNOSIS — Z7951 Long term (current) use of inhaled steroids: Secondary | ICD-10-CM | POA: Insufficient documentation

## 2015-02-19 DIAGNOSIS — K0889 Other specified disorders of teeth and supporting structures: Secondary | ICD-10-CM

## 2015-02-19 DIAGNOSIS — K047 Periapical abscess without sinus: Secondary | ICD-10-CM | POA: Insufficient documentation

## 2015-02-19 DIAGNOSIS — Z9981 Dependence on supplemental oxygen: Secondary | ICD-10-CM | POA: Diagnosis not present

## 2015-02-19 DIAGNOSIS — F319 Bipolar disorder, unspecified: Secondary | ICD-10-CM | POA: Insufficient documentation

## 2015-02-19 DIAGNOSIS — F419 Anxiety disorder, unspecified: Secondary | ICD-10-CM | POA: Diagnosis not present

## 2015-02-19 DIAGNOSIS — Z79891 Long term (current) use of opiate analgesic: Secondary | ICD-10-CM | POA: Insufficient documentation

## 2015-02-19 DIAGNOSIS — Z8614 Personal history of Methicillin resistant Staphylococcus aureus infection: Secondary | ICD-10-CM | POA: Insufficient documentation

## 2015-02-19 DIAGNOSIS — Z79899 Other long term (current) drug therapy: Secondary | ICD-10-CM | POA: Insufficient documentation

## 2015-02-19 DIAGNOSIS — Z8639 Personal history of other endocrine, nutritional and metabolic disease: Secondary | ICD-10-CM | POA: Diagnosis not present

## 2015-02-19 DIAGNOSIS — G4730 Sleep apnea, unspecified: Secondary | ICD-10-CM | POA: Insufficient documentation

## 2015-02-19 DIAGNOSIS — Z720 Tobacco use: Secondary | ICD-10-CM | POA: Diagnosis not present

## 2015-02-19 DIAGNOSIS — Z880 Allergy status to penicillin: Secondary | ICD-10-CM | POA: Diagnosis not present

## 2015-02-19 DIAGNOSIS — Z86718 Personal history of other venous thrombosis and embolism: Secondary | ICD-10-CM | POA: Insufficient documentation

## 2015-02-19 DIAGNOSIS — E119 Type 2 diabetes mellitus without complications: Secondary | ICD-10-CM | POA: Diagnosis not present

## 2015-02-19 DIAGNOSIS — K088 Other specified disorders of teeth and supporting structures: Secondary | ICD-10-CM | POA: Insufficient documentation

## 2015-02-19 MED ORDER — CLINDAMYCIN HCL 150 MG PO CAPS: 300.0000 mg | ORAL_CAPSULE | Freq: Three times a day (TID) | ORAL | Status: DC

## 2015-02-19 MED ORDER — OXYCODONE-ACETAMINOPHEN 5-325 MG PO TABS
2.0000 | ORAL_TABLET | Freq: Once | ORAL | Status: AC
  Administered 2015-02-19: 2 via ORAL
  Filled 2015-02-19: qty 2

## 2015-02-19 MED ORDER — CLINDAMYCIN HCL 150 MG PO CAPS
300.0000 mg | ORAL_CAPSULE | Freq: Once | ORAL | Status: AC
  Administered 2015-02-19: 300 mg via ORAL
  Filled 2015-02-19: qty 2

## 2015-02-19 MED ORDER — IBUPROFEN 800 MG PO TABS: 800.0000 mg | ORAL_TABLET | Freq: Three times a day (TID) | ORAL | Status: DC
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
## 2015-02-19 NOTE — Discharge Instructions (Signed)
Abscessed Tooth An abscessed tooth is an infection around your tooth. It may be caused by holes or damage to the tooth (cavity) or a dental disease. An abscessed tooth causes mild to very bad pain in and around the tooth. See your dentist right away if you have tooth or gum pain. HOME CARE  Take your medicine as told. Finish it even if you start to feel better.  Do not drive after taking pain medicine.  Rinse your mouth (gargle) often with salt water ( teaspoon salt in 8 ounces of warm water).  Do not apply heat to the outside of your face. GET HELP RIGHT AWAY IF:   You have a temperature by mouth above 102 F (38.9 C), not controlled by medicine.  You have chills and a very bad headache.  You have problems breathing or swallowing.  Your mouth will not open.  You develop puffiness (swelling) on the neck or around the eye.  Your pain is not helped by medicine.  Your pain is getting worse instead of better. MAKE SURE YOU:   Understand these instructions.  Will watch your condition.  Will get help right away if you are not doing well or get worse. Document Released: 05/07/2008 Document Revised: 02/11/2012 Document Reviewed: 02/27/2011 Exit[employer] Patient Information 2015 [employer]. This information is not intended to replace advice given to you by your health care provider. Make sure you discuss any questions you have with your health care provider.  

## 2015-03-09 ENCOUNTER — Other Ambulatory Visit: Payer: Self-pay | Admitting: Pain Medicine

## 2015-03-09 DIAGNOSIS — G894 Chronic pain syndrome: Secondary | ICD-10-CM | POA: Diagnosis not present

## 2015-03-09 DIAGNOSIS — Z79891 Long term (current) use of opiate analgesic: Secondary | ICD-10-CM | POA: Diagnosis not present

## 2015-03-09 DIAGNOSIS — M542 Cervicalgia: Secondary | ICD-10-CM | POA: Diagnosis not present

## 2015-03-09 DIAGNOSIS — Z79899 Other long term (current) drug therapy: Secondary | ICD-10-CM | POA: Diagnosis not present

## 2015-03-09 DIAGNOSIS — M549 Dorsalgia, unspecified: Secondary | ICD-10-CM | POA: Diagnosis not present

## 2015-03-09 DIAGNOSIS — M488X6 Other specified spondylopathies, lumbar region: Secondary | ICD-10-CM | POA: Diagnosis not present

## 2015-03-27 ENCOUNTER — Ambulatory Visit
Admission: RE | Admit: 2015-03-27 | Discharge: 2015-03-27 | Disposition: A | Discharge status: Home / Self Care | Payer: Medicare Other | Source: Ambulatory Visit | Attending: Pain Medicine | Admitting: Pain Medicine

## 2015-03-27 DIAGNOSIS — M9971 Connective tissue and disc stenosis of intervertebral foramina of cervical region: Secondary | ICD-10-CM | POA: Diagnosis not present

## 2015-03-27 DIAGNOSIS — M542 Cervicalgia: Secondary | ICD-10-CM

## 2015-03-27 DIAGNOSIS — M5022 Other cervical disc displacement, mid-cervical region: Secondary | ICD-10-CM | POA: Diagnosis not present

## 2015-03-27 DIAGNOSIS — M47812 Spondylosis without myelopathy or radiculopathy, cervical region: Secondary | ICD-10-CM | POA: Diagnosis not present

## 2015-04-13 DIAGNOSIS — Z79899 Other long term (current) drug therapy: Secondary | ICD-10-CM | POA: Diagnosis not present

## 2015-04-13 DIAGNOSIS — M542 Cervicalgia: Secondary | ICD-10-CM | POA: Diagnosis not present

## 2015-04-13 DIAGNOSIS — G894 Chronic pain syndrome: Secondary | ICD-10-CM | POA: Diagnosis not present

## 2015-04-13 DIAGNOSIS — M549 Dorsalgia, unspecified: Secondary | ICD-10-CM | POA: Diagnosis not present

## 2015-04-13 DIAGNOSIS — M79609 Pain in unspecified limb: Secondary | ICD-10-CM | POA: Diagnosis not present

## 2015-04-20 DIAGNOSIS — G894 Chronic pain syndrome: Secondary | ICD-10-CM | POA: Diagnosis not present

## 2015-04-20 DIAGNOSIS — M79609 Pain in unspecified limb: Secondary | ICD-10-CM | POA: Diagnosis not present

## 2015-04-20 DIAGNOSIS — M542 Cervicalgia: Secondary | ICD-10-CM | POA: Diagnosis not present

## 2015-04-27 DIAGNOSIS — F250 Schizoaffective disorder, bipolar type: Secondary | ICD-10-CM | POA: Diagnosis not present

## 2015-04-28 DIAGNOSIS — M79609 Pain in unspecified limb: Secondary | ICD-10-CM | POA: Diagnosis not present

## 2015-04-28 DIAGNOSIS — M5412 Radiculopathy, cervical region: Secondary | ICD-10-CM | POA: Diagnosis not present

## 2015-05-09 DIAGNOSIS — F250 Schizoaffective disorder, bipolar type: Secondary | ICD-10-CM | POA: Diagnosis not present

## 2015-05-12 DIAGNOSIS — Z79899 Other long term (current) drug therapy: Secondary | ICD-10-CM | POA: Diagnosis not present

## 2015-05-12 DIAGNOSIS — M542 Cervicalgia: Secondary | ICD-10-CM | POA: Diagnosis not present

## 2015-05-12 DIAGNOSIS — M79609 Pain in unspecified limb: Secondary | ICD-10-CM | POA: Diagnosis not present

## 2015-05-12 DIAGNOSIS — M549 Dorsalgia, unspecified: Secondary | ICD-10-CM | POA: Diagnosis not present

## 2015-05-12 DIAGNOSIS — G894 Chronic pain syndrome: Secondary | ICD-10-CM | POA: Diagnosis not present

## 2015-05-31 DIAGNOSIS — F250 Schizoaffective disorder, bipolar type: Secondary | ICD-10-CM | POA: Diagnosis not present

## 2015-06-10 DIAGNOSIS — M549 Dorsalgia, unspecified: Secondary | ICD-10-CM | POA: Diagnosis not present

## 2015-06-10 DIAGNOSIS — Z79899 Other long term (current) drug therapy: Secondary | ICD-10-CM | POA: Diagnosis not present

## 2015-06-10 DIAGNOSIS — M79609 Pain in unspecified limb: Secondary | ICD-10-CM | POA: Diagnosis not present

## 2015-06-10 DIAGNOSIS — G894 Chronic pain syndrome: Secondary | ICD-10-CM | POA: Diagnosis not present

## 2015-06-10 DIAGNOSIS — M542 Cervicalgia: Secondary | ICD-10-CM | POA: Diagnosis not present

## 2015-06-15 ENCOUNTER — Emergency Department (HOSPITAL_COMMUNITY)
Admission: EM | Admit: 2015-06-15 | Discharge: 2015-06-15 | Disposition: A | Discharge status: Home / Self Care | Payer: Medicare Other | Attending: Emergency Medicine | Admitting: Emergency Medicine

## 2015-06-15 ENCOUNTER — Encounter (HOSPITAL_COMMUNITY): Payer: Self-pay | Admitting: Emergency Medicine

## 2015-06-15 ENCOUNTER — Emergency Department (HOSPITAL_COMMUNITY): Payer: Medicare Other

## 2015-06-15 DIAGNOSIS — L03116 Cellulitis of left lower limb: Secondary | ICD-10-CM | POA: Diagnosis not present

## 2015-06-15 DIAGNOSIS — I10 Essential (primary) hypertension: Secondary | ICD-10-CM | POA: Diagnosis not present

## 2015-06-15 DIAGNOSIS — Z880 Allergy status to penicillin: Secondary | ICD-10-CM | POA: Diagnosis not present

## 2015-06-15 DIAGNOSIS — S99922A Unspecified injury of left foot, initial encounter: Secondary | ICD-10-CM | POA: Diagnosis present

## 2015-06-15 DIAGNOSIS — Z86718 Personal history of other venous thrombosis and embolism: Secondary | ICD-10-CM | POA: Insufficient documentation

## 2015-06-15 DIAGNOSIS — E119 Type 2 diabetes mellitus without complications: Secondary | ICD-10-CM | POA: Insufficient documentation

## 2015-06-15 DIAGNOSIS — Y9289 Other specified places as the place of occurrence of the external cause: Secondary | ICD-10-CM | POA: Insufficient documentation

## 2015-06-15 DIAGNOSIS — S9032XA Contusion of left foot, initial encounter: Secondary | ICD-10-CM | POA: Diagnosis not present

## 2015-06-15 DIAGNOSIS — F419 Anxiety disorder, unspecified: Secondary | ICD-10-CM | POA: Diagnosis not present

## 2015-06-15 DIAGNOSIS — Z8614 Personal history of Methicillin resistant Staphylococcus aureus infection: Secondary | ICD-10-CM | POA: Diagnosis not present

## 2015-06-15 DIAGNOSIS — Y998 Other external cause status: Secondary | ICD-10-CM | POA: Diagnosis not present

## 2015-06-15 DIAGNOSIS — Z23 Encounter for immunization: Secondary | ICD-10-CM | POA: Insufficient documentation

## 2015-06-15 DIAGNOSIS — Z720 Tobacco use: Secondary | ICD-10-CM | POA: Diagnosis not present

## 2015-06-15 DIAGNOSIS — F329 Major depressive disorder, single episode, unspecified: Secondary | ICD-10-CM | POA: Diagnosis not present

## 2015-06-15 DIAGNOSIS — Z8739 Personal history of other diseases of the musculoskeletal system and connective tissue: Secondary | ICD-10-CM | POA: Insufficient documentation

## 2015-06-15 DIAGNOSIS — G8929 Other chronic pain: Secondary | ICD-10-CM | POA: Insufficient documentation

## 2015-06-15 DIAGNOSIS — W208XXA Other cause of strike by thrown, projected or falling object, initial encounter: Secondary | ICD-10-CM | POA: Insufficient documentation

## 2015-06-15 DIAGNOSIS — M19072 Primary osteoarthritis, left ankle and foot: Secondary | ICD-10-CM | POA: Diagnosis not present

## 2015-06-15 DIAGNOSIS — Z9981 Dependence on supplemental oxygen: Secondary | ICD-10-CM | POA: Diagnosis not present

## 2015-06-15 DIAGNOSIS — Y9389 Activity, other specified: Secondary | ICD-10-CM | POA: Insufficient documentation

## 2015-06-15 DIAGNOSIS — Z79899 Other long term (current) drug therapy: Secondary | ICD-10-CM | POA: Insufficient documentation

## 2015-06-15 DIAGNOSIS — M79672 Pain in left foot: Secondary | ICD-10-CM | POA: Diagnosis not present

## 2015-06-15 DIAGNOSIS — G4730 Sleep apnea, unspecified: Secondary | ICD-10-CM | POA: Diagnosis not present

## 2015-06-15 MED ORDER — TETANUS-DIPHTH-ACELL PERTUSSIS 5-2.5-18.5 LF-MCG/0.5 IM SUSP
INTRAMUSCULAR | Status: AC
  Administered 2015-06-15: 0.5 mL via INTRAMUSCULAR
  Filled 2015-06-15: qty 0.5

## 2015-06-15 MED ORDER — DOXYCYCLINE HYCLATE 100 MG PO TABS
100.0000 mg | ORAL_TABLET | Freq: Once | ORAL | Status: AC
  Administered 2015-06-15: 100 mg via ORAL
  Filled 2015-06-15: qty 1

## 2015-06-15 MED ORDER — TETANUS-DIPHTH-ACELL PERTUSSIS 5-2.5-18.5 LF-MCG/0.5 IM SUSP
0.5000 mL | Freq: Once | INTRAMUSCULAR | Status: AC
  Administered 2015-06-15: 0.5 mL via INTRAMUSCULAR

## 2015-06-15 MED ORDER — DOXYCYCLINE HYCLATE 100 MG PO CAPS: 100.0000 mg | ORAL_CAPSULE | Freq: Two times a day (BID) | ORAL | Status: DC
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
## 2015-06-15 NOTE — Discharge Instructions (Signed)
Contusion A contusion is a deep bruise. Contusions are the result of an injury that caused bleeding under the skin. The contusion may turn blue, purple, or yellow. Minor injuries will give you a painless contusion, but more severe contusions may stay painful and swollen for a few weeks.  CAUSES  A contusion is usually caused by a blow, trauma, or direct force to an area of the body. SYMPTOMS   Swelling and redness of the injured area.  Bruising of the injured area.  Tenderness and soreness of the injured area.  Pain. DIAGNOSIS  The diagnosis can be made by taking a history and physical exam. An X-ray, CT scan, or MRI may be needed to determine if there were any associated injuries, such as fractures. TREATMENT  Specific treatment will depend on what area of the body was injured. In general, the best treatment for a contusion is resting, icing, elevating, and applying cold compresses to the injured area. Over-the-counter medicines may also be recommended for pain control. Ask your caregiver what the best treatment is for your contusion. HOME CARE INSTRUCTIONS   Put ice on the injured area.  Put ice in a plastic bag.  Place a towel between your skin and the bag.  Leave the ice on for 15-20 minutes, 3-4 times a day, or as directed by your health care provider.  Only take over-the-counter or prescription medicines for pain, discomfort, or fever as directed by your caregiver. Your caregiver may recommend avoiding anti-inflammatory medicines (aspirin, ibuprofen, and naproxen) for 48 hours because these medicines may increase bruising.  Rest the injured area.  If possible, elevate the injured area to reduce swelling. SEEK IMMEDIATE MEDICAL CARE IF:   You have increased bruising or swelling.  You have pain that is getting worse.  Your swelling or pain is not relieved with medicines. MAKE SURE YOU:   Understand these instructions.  Will watch your condition.  Will get help right  away if you are not doing well or get worse. Document Released: 08/29/2005 Document Revised: 11/24/2013 Document Reviewed: 09/24/2011 [facility] Patient Information 2015 [place], [place]. This information is not intended to replace advice given to you by your health care provider. Make sure you discuss any questions you have with your health care provider.  Cellulitis Cellulitis is an infection of the skin and the tissue beneath it. The infected area is usually red and tender. Cellulitis occurs most often in the arms and lower legs.  CAUSES  Cellulitis is caused by bacteria that enter the skin through cracks or cuts in the skin. The most common types of bacteria that cause cellulitis are staphylococci and streptococci. SIGNS AND SYMPTOMS   Redness and warmth.  Swelling.  Tenderness or pain.  Fever. DIAGNOSIS  Your health care provider can usually determine what is wrong based on a physical exam. Blood tests may also be done. TREATMENT  Treatment usually involves taking an antibiotic medicine. HOME CARE INSTRUCTIONS   Take your antibiotic medicine as directed by your health care provider. Finish the antibiotic even if you start to feel better.  Keep the infected arm or leg elevated to reduce swelling.  Apply a warm cloth to the affected area up to 4 times per day to relieve pain.  Take medicines only as directed by your health care provider.  Keep all follow-up visits as directed by your health care provider. SEEK MEDICAL CARE IF:   You notice red streaks coming from the infected area.  Your red area gets larger or  turns dark in color.  Your bone or joint underneath the infected area becomes painful after the skin has healed.  Your infection returns in the same area or another area.  You notice a swollen bump in the infected area.  You develop new symptoms.  You have a fever. SEEK IMMEDIATE MEDICAL CARE IF:   You feel very sleepy.  You develop vomiting or  diarrhea.  You have a general ill feeling (malaise) with muscle aches and pains. MAKE SURE YOU:   Understand these instructions.  Will watch your condition.  Will get help right away if you are not doing well or get worse. Document Released: 08/29/2005 Document Revised: 04/05/2014 Document Reviewed: 02/04/2012 [facility] Patient Information 2015 [place], [place]. This information is not intended to replace advice given to you by your health care provider. Make sure you discuss any questions you have with your health care provider.  Elevate foot as much as possible as discussed.  Make sure you unwrap the Ace wrap twice daily to assess for any worsened or spreading redness.  Take one more dose of your antibiotic tablet this evening.  Follow-up with Dr. Aline Brochure for recheck in 2 days.  If he is not available anything cure infection is getting worse, return here for a recheck.
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
## 2015-06-28 DIAGNOSIS — F250 Schizoaffective disorder, bipolar type: Secondary | ICD-10-CM | POA: Diagnosis not present

## 2015-07-07 DIAGNOSIS — G894 Chronic pain syndrome: Secondary | ICD-10-CM | POA: Diagnosis not present

## 2015-07-07 DIAGNOSIS — Z79899 Other long term (current) drug therapy: Secondary | ICD-10-CM | POA: Diagnosis not present

## 2015-07-08 DIAGNOSIS — M79609 Pain in unspecified limb: Secondary | ICD-10-CM | POA: Diagnosis not present

## 2015-07-08 DIAGNOSIS — M542 Cervicalgia: Secondary | ICD-10-CM | POA: Diagnosis not present

## 2015-07-14 ENCOUNTER — Telehealth: Payer: Self-pay | Admitting: Medical

## 2015-07-14 NOTE — Telephone Encounter (Signed)
Recv'd message from answering service that pt needs appt.  I called and left message

## 2015-07-21 ENCOUNTER — Ambulatory Visit: Payer: Medicare Other | Admitting: Medical

## 2015-07-26 ENCOUNTER — Ambulatory Visit: Payer: Medicare Other | Admitting: Medical

## 2015-07-29 ENCOUNTER — Encounter: Payer: Self-pay | Admitting: Medical

## 2015-08-15 DIAGNOSIS — M545 Low back pain: Secondary | ICD-10-CM | POA: Diagnosis not present

## 2015-08-15 DIAGNOSIS — Z79899 Other long term (current) drug therapy: Secondary | ICD-10-CM | POA: Diagnosis not present

## 2015-08-15 DIAGNOSIS — G894 Chronic pain syndrome: Secondary | ICD-10-CM | POA: Diagnosis not present

## 2015-08-15 DIAGNOSIS — M5137 Other intervertebral disc degeneration, lumbosacral region: Secondary | ICD-10-CM | POA: Diagnosis not present

## 2015-09-12 DIAGNOSIS — M545 Low back pain: Secondary | ICD-10-CM | POA: Diagnosis not present

## 2015-09-12 DIAGNOSIS — Z79899 Other long term (current) drug therapy: Secondary | ICD-10-CM | POA: Diagnosis not present

## 2015-09-12 DIAGNOSIS — G894 Chronic pain syndrome: Secondary | ICD-10-CM | POA: Diagnosis not present

## 2015-09-12 DIAGNOSIS — M542 Cervicalgia: Secondary | ICD-10-CM | POA: Diagnosis not present

## 2015-09-27 DIAGNOSIS — F410 Panic disorder [episodic paroxysmal anxiety] without agoraphobia: Secondary | ICD-10-CM | POA: Diagnosis not present

## 2015-09-28 DIAGNOSIS — M5137 Other intervertebral disc degeneration, lumbosacral region: Secondary | ICD-10-CM | POA: Diagnosis not present

## 2015-09-28 DIAGNOSIS — M79609 Pain in unspecified limb: Secondary | ICD-10-CM | POA: Diagnosis not present

## 2015-09-28 DIAGNOSIS — M47817 Spondylosis without myelopathy or radiculopathy, lumbosacral region: Secondary | ICD-10-CM | POA: Diagnosis not present

## 2015-09-28 DIAGNOSIS — M542 Cervicalgia: Secondary | ICD-10-CM | POA: Diagnosis not present

## 2015-09-30 DIAGNOSIS — F410 Panic disorder [episodic paroxysmal anxiety] without agoraphobia: Secondary | ICD-10-CM | POA: Diagnosis not present

## 2015-10-10 DIAGNOSIS — M792 Neuralgia and neuritis, unspecified: Secondary | ICD-10-CM | POA: Diagnosis not present

## 2015-10-10 DIAGNOSIS — Z79899 Other long term (current) drug therapy: Secondary | ICD-10-CM | POA: Diagnosis not present

## 2015-10-10 DIAGNOSIS — M545 Low back pain: Secondary | ICD-10-CM | POA: Diagnosis not present

## 2015-10-10 DIAGNOSIS — M542 Cervicalgia: Secondary | ICD-10-CM | POA: Diagnosis not present

## 2015-10-10 DIAGNOSIS — Z79891 Long term (current) use of opiate analgesic: Secondary | ICD-10-CM | POA: Diagnosis not present

## 2015-10-10 DIAGNOSIS — G894 Chronic pain syndrome: Secondary | ICD-10-CM | POA: Diagnosis not present

## 2015-10-26 DIAGNOSIS — M47812 Spondylosis without myelopathy or radiculopathy, cervical region: Secondary | ICD-10-CM | POA: Diagnosis not present

## 2015-11-07 DIAGNOSIS — M792 Neuralgia and neuritis, unspecified: Secondary | ICD-10-CM | POA: Diagnosis not present

## 2015-11-07 DIAGNOSIS — Z79891 Long term (current) use of opiate analgesic: Secondary | ICD-10-CM | POA: Diagnosis not present

## 2015-11-07 DIAGNOSIS — G894 Chronic pain syndrome: Secondary | ICD-10-CM | POA: Diagnosis not present

## 2015-11-07 DIAGNOSIS — M47812 Spondylosis without myelopathy or radiculopathy, cervical region: Secondary | ICD-10-CM | POA: Diagnosis not present

## 2015-11-07 DIAGNOSIS — Z79899 Other long term (current) drug therapy: Secondary | ICD-10-CM | POA: Diagnosis not present

## 2015-11-10 DIAGNOSIS — G894 Chronic pain syndrome: Secondary | ICD-10-CM | POA: Diagnosis not present

## 2015-11-10 DIAGNOSIS — Z79899 Other long term (current) drug therapy: Secondary | ICD-10-CM | POA: Diagnosis not present

## 2015-11-24 DIAGNOSIS — M47812 Spondylosis without myelopathy or radiculopathy, cervical region: Secondary | ICD-10-CM | POA: Diagnosis not present

## 2015-12-15 DIAGNOSIS — M47812 Spondylosis without myelopathy or radiculopathy, cervical region: Secondary | ICD-10-CM | POA: Diagnosis not present

## 2015-12-20 ENCOUNTER — Encounter: Payer: Self-pay | Admitting: Medical

## 2015-12-20 ENCOUNTER — Ambulatory Visit (INDEPENDENT_AMBULATORY_CARE_PROVIDER_SITE_OTHER): Payer: Medicare Other | Admitting: Medical

## 2015-12-20 VITALS — BP 104/70 | HR 98 | Ht 75.5 in | Wt 277.0 lb

## 2015-12-20 DIAGNOSIS — J309 Allergic rhinitis, unspecified: Secondary | ICD-10-CM

## 2015-12-20 DIAGNOSIS — G8929 Other chronic pain: Secondary | ICD-10-CM | POA: Diagnosis not present

## 2015-12-20 DIAGNOSIS — F17200 Nicotine dependence, unspecified, uncomplicated: Secondary | ICD-10-CM | POA: Diagnosis not present

## 2015-12-20 DIAGNOSIS — M549 Dorsalgia, unspecified: Secondary | ICD-10-CM | POA: Diagnosis not present

## 2015-12-20 DIAGNOSIS — I10 Essential (primary) hypertension: Secondary | ICD-10-CM | POA: Diagnosis not present

## 2015-12-20 DIAGNOSIS — Z23 Encounter for immunization: Secondary | ICD-10-CM | POA: Diagnosis not present

## 2015-12-20 DIAGNOSIS — N529 Male erectile dysfunction, unspecified: Secondary | ICD-10-CM | POA: Insufficient documentation

## 2015-12-20 DIAGNOSIS — E785 Hyperlipidemia, unspecified: Secondary | ICD-10-CM

## 2015-12-20 DIAGNOSIS — G4733 Obstructive sleep apnea (adult) (pediatric): Secondary | ICD-10-CM | POA: Diagnosis not present

## 2015-12-20 DIAGNOSIS — Z0000 Encounter for general adult medical examination without abnormal findings: Secondary | ICD-10-CM | POA: Diagnosis not present

## 2015-12-20 DIAGNOSIS — M4806 Spinal stenosis, lumbar region: Secondary | ICD-10-CM

## 2015-12-20 DIAGNOSIS — E118 Type 2 diabetes mellitus with unspecified complications: Secondary | ICD-10-CM | POA: Diagnosis not present

## 2015-12-20 DIAGNOSIS — R202 Paresthesia of skin: Secondary | ICD-10-CM

## 2015-12-20 DIAGNOSIS — F310 Bipolar disorder, current episode hypomanic: Secondary | ICD-10-CM

## 2015-12-20 DIAGNOSIS — M48061 Spinal stenosis, lumbar region without neurogenic claudication: Secondary | ICD-10-CM

## 2015-12-20 LAB — POCT URINALYSIS DIPSTICK
Bilirubin, UA: NEGATIVE
Blood, UA: NEGATIVE
GLUCOSE UA: NEGATIVE
KETONES UA: NEGATIVE
Leukocytes, UA: NEGATIVE
Nitrite, UA: NEGATIVE
PROTEIN UA: NEGATIVE
Spec Grav, UA: 1.02
UROBILINOGEN UA: NEGATIVE
pH, UA: 6.5

## 2015-12-20 LAB — COMPREHENSIVE METABOLIC PANEL
ALT: 20 U/L (ref 9–46)
AST: 14 U/L (ref 10–40)
Albumin: 4.1 g/dL (ref 3.6–5.1)
Alkaline Phosphatase: 74 U/L (ref 40–115)
BUN: 8 mg/dL (ref 7–25)
CALCIUM: 9.1 mg/dL (ref 8.6–10.3)
CHLORIDE: 103 mmol/L (ref 98–110)
CO2: 27 mmol/L (ref 20–31)
Creat: 0.78 mg/dL (ref 0.60–1.35)
GLUCOSE: 86 mg/dL (ref 65–99)
POTASSIUM: 4.5 mmol/L (ref 3.5–5.3)
Sodium: 141 mmol/L (ref 135–146)
Total Bilirubin: 0.3 mg/dL (ref 0.2–1.2)
Total Protein: 6.8 g/dL (ref 6.1–8.1)

## 2015-12-20 LAB — CBC WITH DIFFERENTIAL/PLATELET
Basophils Absolute: 0 10*3/uL (ref 0.0–0.1)
Basophils Relative: 0 % (ref 0–1)
Eosinophils Absolute: 0.4 10*3/uL (ref 0.0–0.7)
Eosinophils Relative: 3 % (ref 0–5)
HCT: 44.8 % (ref 39.0–52.0)
Hemoglobin: 15.2 g/dL (ref 13.0–17.0)
Lymphocytes Relative: 22 % (ref 12–46)
Lymphs Abs: 2.6 10*3/uL (ref 0.7–4.0)
MCH: 30.4 pg (ref 26.0–34.0)
MCHC: 33.9 g/dL (ref 30.0–36.0)
MCV: 89.6 fL (ref 78.0–100.0)
MPV: 10.1 fL (ref 8.6–12.4)
Monocytes Absolute: 1.1 10*3/uL — ABNORMAL HIGH (ref 0.1–1.0)
Monocytes Relative: 9 % (ref 3–12)
Neutro Abs: 7.7 10*3/uL (ref 1.7–7.7)
Neutrophils Relative %: 66 % (ref 43–77)
Platelets: 338 10*3/uL (ref 150–400)
RBC: 5 MIL/uL (ref 4.22–5.81)
RDW: 13.5 % (ref 11.5–15.5)
WBC: 11.7 10*3/uL — ABNORMAL HIGH (ref 4.0–10.5)

## 2015-12-20 LAB — LIPID PANEL
Cholesterol: 140 mg/dL (ref 125–200)
HDL: 41 mg/dL (ref >=40)
LDL Cholesterol: 82 mg/dL (ref <130)
Total CHOL/HDL Ratio: 3.4 Ratio (ref <=5.0)
Triglycerides: 85 mg/dL (ref <150)
VLDL: 17 mg/dL (ref <30)
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
## 2015-12-21 ENCOUNTER — Encounter: Payer: Self-pay | Admitting: Medical

## 2015-12-21 LAB — MICROALBUMIN / CREATININE URINE RATIO
Creatinine, Urine: 135 mg/dL (ref 20–370)
MICROALB UR: 0.4 mg/dL
MICROALB/CREAT RATIO: 3 ug/mg{creat} (ref <30)

## 2015-12-21 LAB — HEMOGLOBIN A1C
Hgb A1c MFr Bld: 5.7 % — ABNORMAL HIGH (ref <5.7)
Mean Plasma Glucose: 117 mg/dL — ABNORMAL HIGH (ref <117)

## 2015-12-23 MED ORDER — SILDENAFIL CITRATE 50 MG PO TABS: 50.0000 mg | ORAL_TABLET | Freq: Every day | ORAL | Status: DC | PRN

## 2016-01-02 DIAGNOSIS — M792 Neuralgia and neuritis, unspecified: Secondary | ICD-10-CM | POA: Diagnosis not present

## 2016-01-02 DIAGNOSIS — M47812 Spondylosis without myelopathy or radiculopathy, cervical region: Secondary | ICD-10-CM | POA: Diagnosis not present

## 2016-01-02 DIAGNOSIS — Z79899 Other long term (current) drug therapy: Secondary | ICD-10-CM | POA: Diagnosis not present

## 2016-01-02 DIAGNOSIS — Z79891 Long term (current) use of opiate analgesic: Secondary | ICD-10-CM | POA: Diagnosis not present

## 2016-01-02 DIAGNOSIS — G894 Chronic pain syndrome: Secondary | ICD-10-CM | POA: Diagnosis not present

## 2016-02-01 DIAGNOSIS — M545 Low back pain: Secondary | ICD-10-CM | POA: Diagnosis not present

## 2016-02-01 DIAGNOSIS — Z79899 Other long term (current) drug therapy: Secondary | ICD-10-CM | POA: Diagnosis not present

## 2016-02-01 DIAGNOSIS — M47812 Spondylosis without myelopathy or radiculopathy, cervical region: Secondary | ICD-10-CM | POA: Diagnosis not present

## 2016-02-01 DIAGNOSIS — G894 Chronic pain syndrome: Secondary | ICD-10-CM | POA: Diagnosis not present

## 2016-02-01 DIAGNOSIS — M792 Neuralgia and neuritis, unspecified: Secondary | ICD-10-CM | POA: Diagnosis not present

## 2016-03-05 DIAGNOSIS — M792 Neuralgia and neuritis, unspecified: Secondary | ICD-10-CM | POA: Diagnosis not present

## 2016-03-05 DIAGNOSIS — Z79899 Other long term (current) drug therapy: Secondary | ICD-10-CM | POA: Diagnosis not present

## 2016-03-05 DIAGNOSIS — G894 Chronic pain syndrome: Secondary | ICD-10-CM | POA: Diagnosis not present

## 2016-03-05 DIAGNOSIS — M47812 Spondylosis without myelopathy or radiculopathy, cervical region: Secondary | ICD-10-CM | POA: Diagnosis not present

## 2016-03-05 DIAGNOSIS — M545 Low back pain: Secondary | ICD-10-CM | POA: Diagnosis not present

## 2016-03-05 DIAGNOSIS — Z79891 Long term (current) use of opiate analgesic: Secondary | ICD-10-CM | POA: Diagnosis not present

## 2016-03-09 IMAGING — CT CT MAXILLOFACIAL W/O CM
3 of 4 series · 16 of 47 positions shown, 19 images · non-contrast
Comparison: MR HEAD WO/W CM dated 09/25/2012; CT PARANASAL SINUSES
W/O CM dated 03/27/2011

CLINICAL DATA: Chronic sinusitus

EXAM:
CT MAXILLOFACIAL WITHOUT CONTRAST
TECHNIQUE: Multidetector CT imaging of the maxillofacial structures was
performed. Multiplanar CT image reconstructions were also generated.
A small metallic BB was placed on the right temple in order to
reliably differentiate right from left.

[Series 3: stealth sinus sinus alg 1.0 · axial · 0.34mm/px · z∈[+896,+996]mm · 10 of 118 slices shown, 13 images]
[im 9/118  brain]
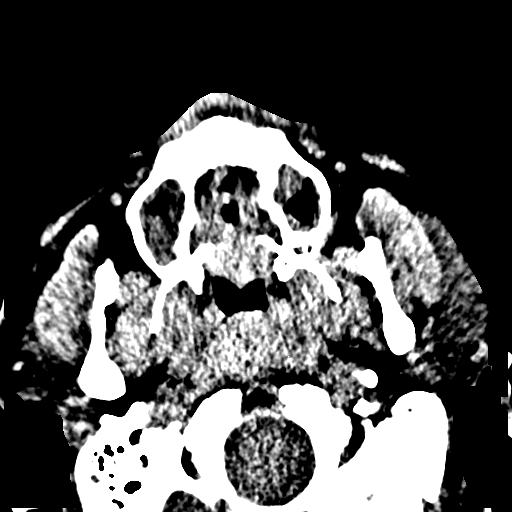
[im 9/118  bone]
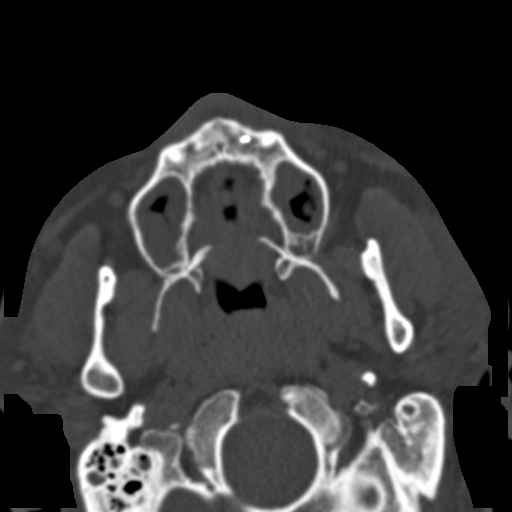
[im 17/118  bone]
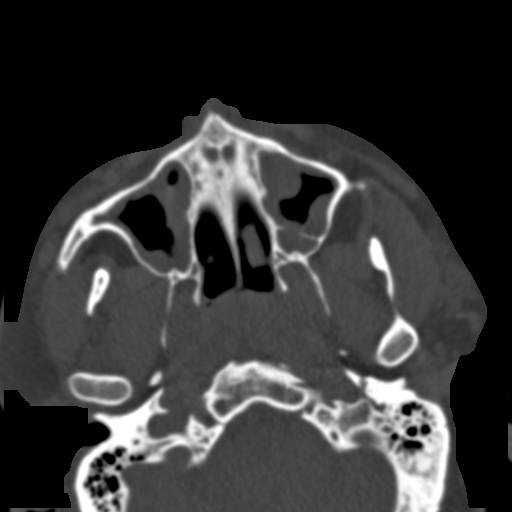
[im 34/118  bone]
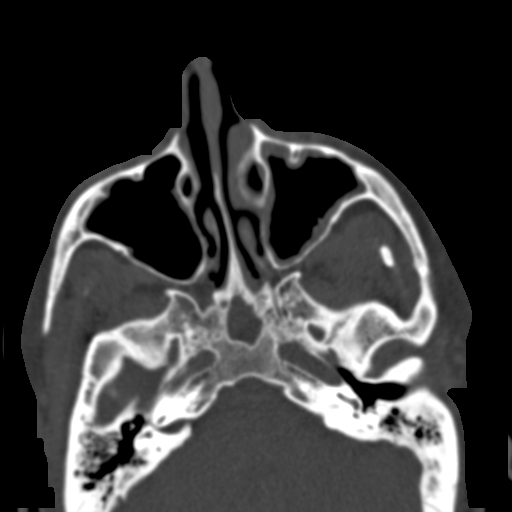
[im 42/118  bone]
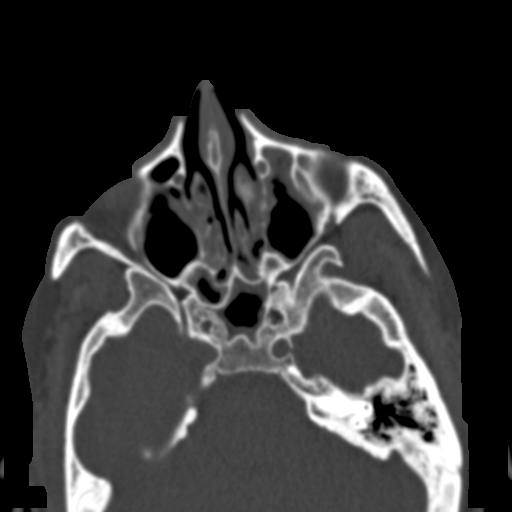
[im 51/118  brain]
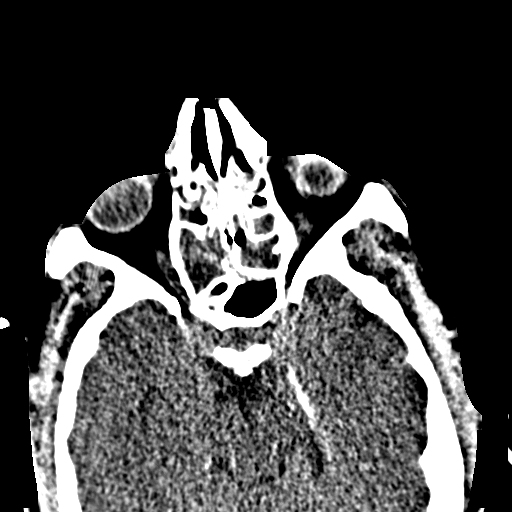
[im 51/118  bone]
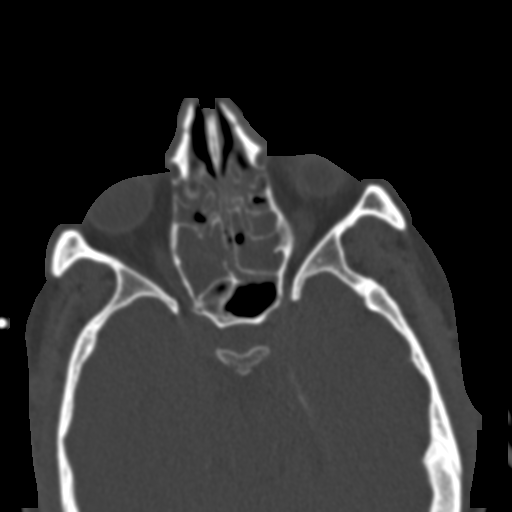
[im 67/118  bone]
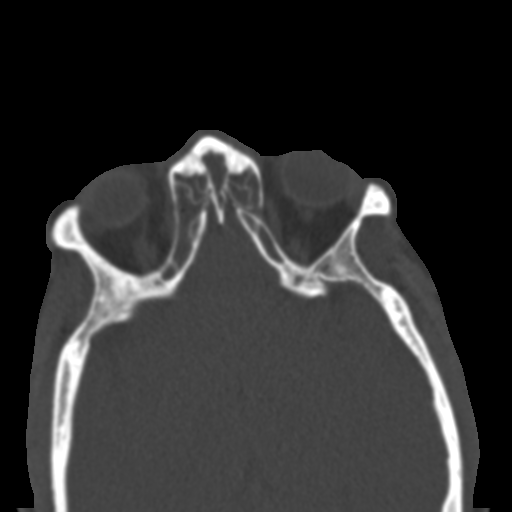
[im 76/118  bone]
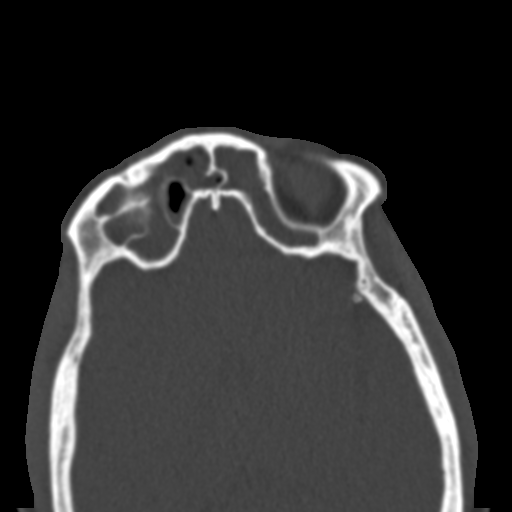
[im 84/118  bone]
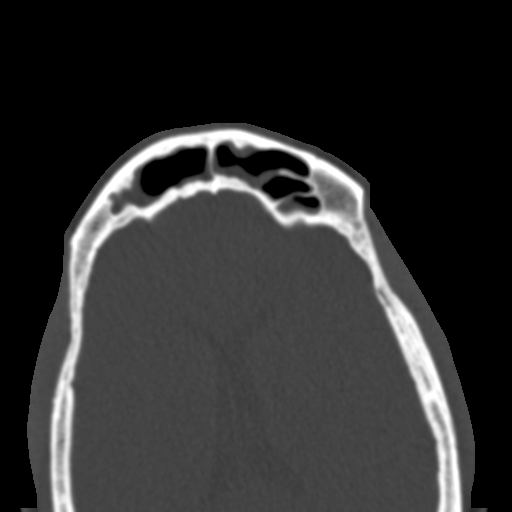
[im 101/118  brain]
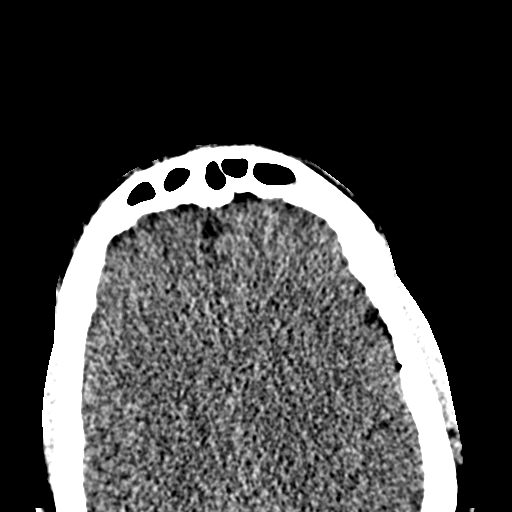
[im 101/118  bone]
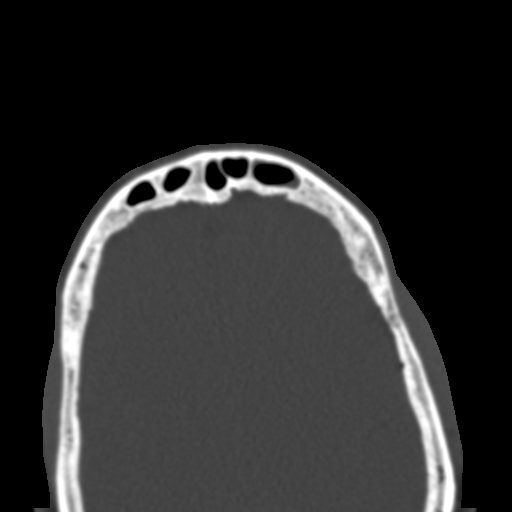
[im 109/118  bone]
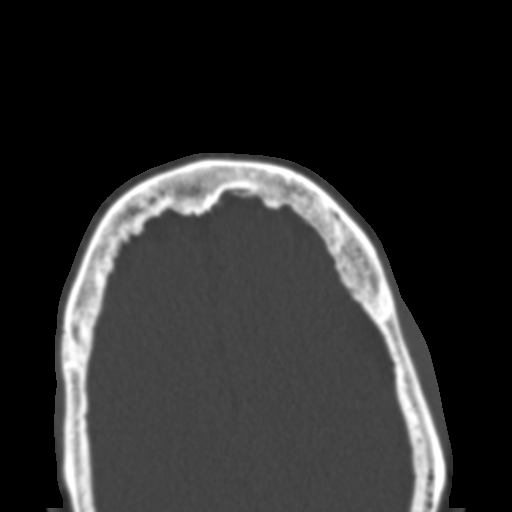

[Series 4: stealth sinus coro 2.0 · coronal · 0.27mm/px · 3 of 85 slices shown]
[im 29/85  bone]
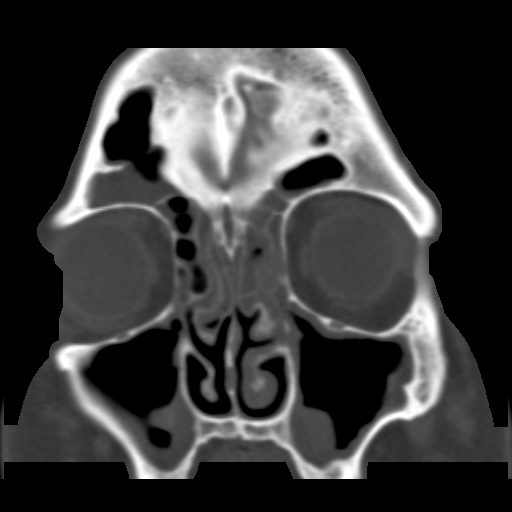
[im 38/85  bone]
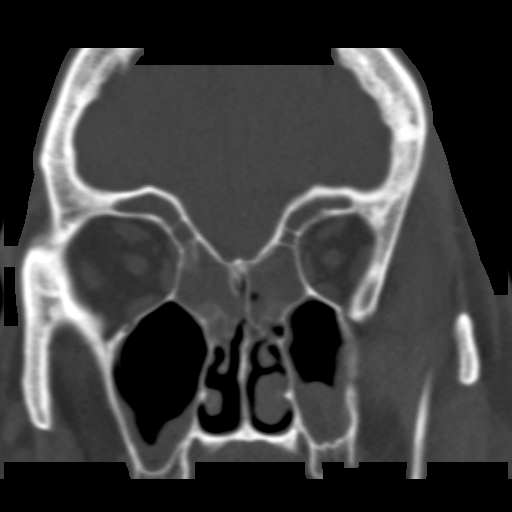
[im 47/85  bone]
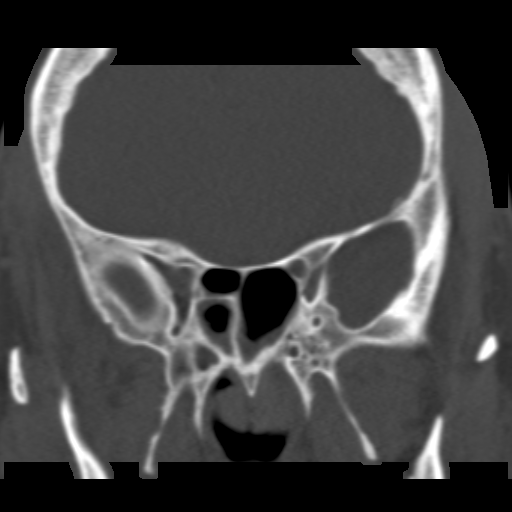

[Series 5: stealth sinus sag 2.0 · sagittal · 0.27mm/px · 3 of 104 slices shown]
[im 35/104  bone]
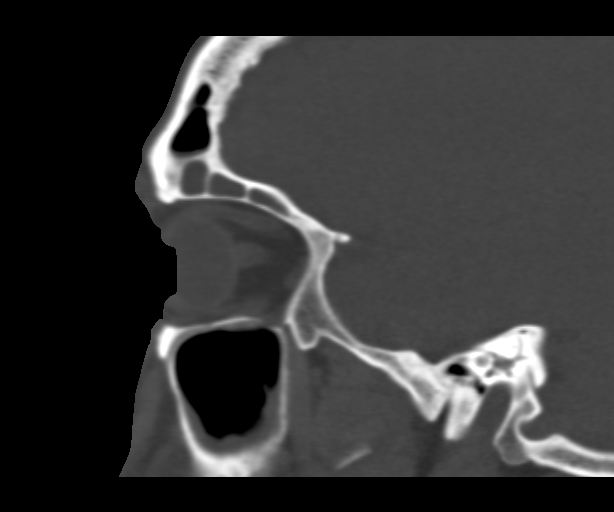
[im 52/104  bone]
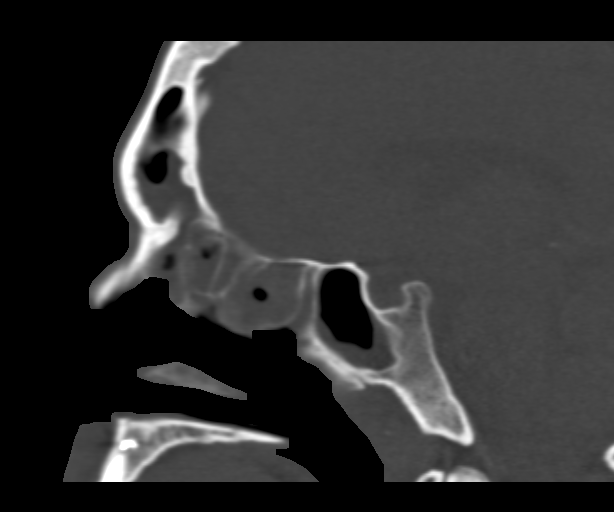
[im 69/104  bone]
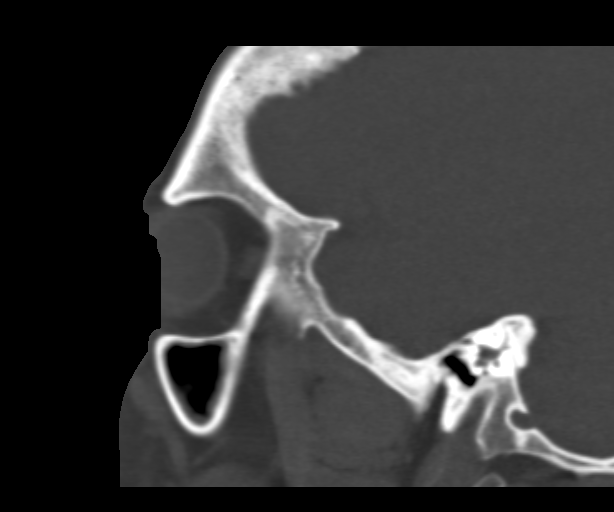

[16 of 47 positions shown; findings below may reference images not displayed]

FINDINGS: Areas of mucosal thickening and opacification identified within the
frontal sinuses, ethmoid air cells, and maxillary sinuses. Areas of
opacification within the mastoid air cells on the left. Bilateral
ostial stenosis of the maxillary sinuses is appreciated as well as
mucosal thickening of the middle terminates and left inferior
turbinate. There is no evidence of acute fracture nor dislocation.
The temporomandibular joints are unremarkable.
IMPRESSION: Pansinusitis increased from prior.

## 2016-03-26 DIAGNOSIS — Z79891 Long term (current) use of opiate analgesic: Secondary | ICD-10-CM | POA: Diagnosis not present

## 2016-03-26 DIAGNOSIS — Z79899 Other long term (current) drug therapy: Secondary | ICD-10-CM | POA: Diagnosis not present

## 2016-03-26 DIAGNOSIS — G894 Chronic pain syndrome: Secondary | ICD-10-CM | POA: Diagnosis not present

## 2016-03-26 DIAGNOSIS — M47817 Spondylosis without myelopathy or radiculopathy, lumbosacral region: Secondary | ICD-10-CM | POA: Diagnosis not present

## 2016-03-26 DIAGNOSIS — M47812 Spondylosis without myelopathy or radiculopathy, cervical region: Secondary | ICD-10-CM | POA: Diagnosis not present

## 2016-03-26 DIAGNOSIS — M5137 Other intervertebral disc degeneration, lumbosacral region: Secondary | ICD-10-CM | POA: Diagnosis not present

## 2016-04-16 DIAGNOSIS — Z79899 Other long term (current) drug therapy: Secondary | ICD-10-CM | POA: Diagnosis not present

## 2016-04-16 DIAGNOSIS — G894 Chronic pain syndrome: Secondary | ICD-10-CM | POA: Diagnosis not present

## 2016-04-16 DIAGNOSIS — M47817 Spondylosis without myelopathy or radiculopathy, lumbosacral region: Secondary | ICD-10-CM | POA: Diagnosis not present

## 2016-04-16 DIAGNOSIS — Z79891 Long term (current) use of opiate analgesic: Secondary | ICD-10-CM | POA: Diagnosis not present

## 2016-04-16 DIAGNOSIS — M5137 Other intervertebral disc degeneration, lumbosacral region: Secondary | ICD-10-CM | POA: Diagnosis not present

## 2016-04-16 DIAGNOSIS — M47812 Spondylosis without myelopathy or radiculopathy, cervical region: Secondary | ICD-10-CM | POA: Diagnosis not present

## 2016-05-07 DIAGNOSIS — M47812 Spondylosis without myelopathy or radiculopathy, cervical region: Secondary | ICD-10-CM | POA: Diagnosis not present

## 2016-05-07 DIAGNOSIS — R51 Headache: Secondary | ICD-10-CM | POA: Diagnosis not present

## 2016-05-14 DIAGNOSIS — E669 Obesity, unspecified: Secondary | ICD-10-CM | POA: Diagnosis not present

## 2016-05-14 DIAGNOSIS — M47817 Spondylosis without myelopathy or radiculopathy, lumbosacral region: Secondary | ICD-10-CM | POA: Diagnosis not present

## 2016-05-14 DIAGNOSIS — Z79899 Other long term (current) drug therapy: Secondary | ICD-10-CM | POA: Diagnosis not present

## 2016-05-14 DIAGNOSIS — M47812 Spondylosis without myelopathy or radiculopathy, cervical region: Secondary | ICD-10-CM | POA: Diagnosis not present

## 2016-05-14 DIAGNOSIS — G894 Chronic pain syndrome: Secondary | ICD-10-CM | POA: Diagnosis not present

## 2016-05-14 DIAGNOSIS — M5137 Other intervertebral disc degeneration, lumbosacral region: Secondary | ICD-10-CM | POA: Diagnosis not present

## 2016-05-14 DIAGNOSIS — Z79891 Long term (current) use of opiate analgesic: Secondary | ICD-10-CM | POA: Diagnosis not present

## 2016-06-04 DIAGNOSIS — M542 Cervicalgia: Secondary | ICD-10-CM | POA: Diagnosis not present

## 2016-06-04 DIAGNOSIS — R51 Headache: Secondary | ICD-10-CM | POA: Diagnosis not present

## 2016-06-04 DIAGNOSIS — M47812 Spondylosis without myelopathy or radiculopathy, cervical region: Secondary | ICD-10-CM | POA: Diagnosis not present

## 2016-06-11 DIAGNOSIS — F250 Schizoaffective disorder, bipolar type: Secondary | ICD-10-CM | POA: Diagnosis not present

## 2016-06-11 DIAGNOSIS — M5137 Other intervertebral disc degeneration, lumbosacral region: Secondary | ICD-10-CM | POA: Diagnosis not present

## 2016-06-11 DIAGNOSIS — Z79891 Long term (current) use of opiate analgesic: Secondary | ICD-10-CM | POA: Diagnosis not present

## 2016-06-11 DIAGNOSIS — M542 Cervicalgia: Secondary | ICD-10-CM | POA: Diagnosis not present

## 2016-06-11 DIAGNOSIS — G894 Chronic pain syndrome: Secondary | ICD-10-CM | POA: Diagnosis not present

## 2016-06-11 DIAGNOSIS — M47812 Spondylosis without myelopathy or radiculopathy, cervical region: Secondary | ICD-10-CM | POA: Diagnosis not present

## 2016-06-11 DIAGNOSIS — Z79899 Other long term (current) drug therapy: Secondary | ICD-10-CM | POA: Diagnosis not present

## 2016-07-09 DIAGNOSIS — G894 Chronic pain syndrome: Secondary | ICD-10-CM | POA: Diagnosis not present

## 2016-07-09 DIAGNOSIS — M47812 Spondylosis without myelopathy or radiculopathy, cervical region: Secondary | ICD-10-CM | POA: Diagnosis not present

## 2016-07-09 DIAGNOSIS — M546 Pain in thoracic spine: Secondary | ICD-10-CM | POA: Diagnosis not present

## 2016-07-09 DIAGNOSIS — E669 Obesity, unspecified: Secondary | ICD-10-CM | POA: Diagnosis not present

## 2016-07-09 DIAGNOSIS — Z79891 Long term (current) use of opiate analgesic: Secondary | ICD-10-CM | POA: Diagnosis not present

## 2016-07-09 DIAGNOSIS — Z79899 Other long term (current) drug therapy: Secondary | ICD-10-CM | POA: Diagnosis not present

## 2016-07-09 DIAGNOSIS — M47817 Spondylosis without myelopathy or radiculopathy, lumbosacral region: Secondary | ICD-10-CM | POA: Diagnosis not present

## 2016-07-12 ENCOUNTER — Telehealth: Payer: Self-pay | Admitting: Medical

## 2016-07-12 NOTE — Telephone Encounter (Signed)
Called pt and left message for him to call back. He needs to sched a 6 mo fu appt

## 2016-07-18 ENCOUNTER — Encounter: Payer: Self-pay | Admitting: Medical

## 2016-07-18 ENCOUNTER — Ambulatory Visit (INDEPENDENT_AMBULATORY_CARE_PROVIDER_SITE_OTHER): Payer: Medicare Other | Admitting: Medical

## 2016-07-18 ENCOUNTER — Ambulatory Visit
Admission: RE | Admit: 2016-07-18 | Discharge: 2016-07-18 | Disposition: A | Discharge status: Home / Self Care | Payer: Medicare Other | Source: Ambulatory Visit | Attending: Medical | Admitting: Medical

## 2016-07-18 VITALS — BP 150/92 | HR 114 | Wt 294.0 lb

## 2016-07-18 DIAGNOSIS — M25461 Effusion, right knee: Secondary | ICD-10-CM | POA: Diagnosis not present

## 2016-07-18 DIAGNOSIS — M48061 Spinal stenosis, lumbar region without neurogenic claudication: Secondary | ICD-10-CM

## 2016-07-18 DIAGNOSIS — M4806 Spinal stenosis, lumbar region: Secondary | ICD-10-CM

## 2016-07-18 DIAGNOSIS — M25469 Effusion, unspecified knee: Secondary | ICD-10-CM | POA: Insufficient documentation

## 2016-07-18 DIAGNOSIS — G8929 Other chronic pain: Secondary | ICD-10-CM

## 2016-07-18 DIAGNOSIS — G4733 Obstructive sleep apnea (adult) (pediatric): Secondary | ICD-10-CM

## 2016-07-18 DIAGNOSIS — M25561 Pain in right knee: Secondary | ICD-10-CM | POA: Diagnosis not present

## 2016-07-18 DIAGNOSIS — I10 Essential (primary) hypertension: Secondary | ICD-10-CM | POA: Diagnosis not present

## 2016-07-18 DIAGNOSIS — E118 Type 2 diabetes mellitus with unspecified complications: Secondary | ICD-10-CM | POA: Diagnosis not present

## 2016-07-18 DIAGNOSIS — M2391 Unspecified internal derangement of right knee: Secondary | ICD-10-CM | POA: Diagnosis not present

## 2016-07-18 DIAGNOSIS — E785 Hyperlipidemia, unspecified: Secondary | ICD-10-CM | POA: Diagnosis not present

## 2016-07-18 DIAGNOSIS — M549 Dorsalgia, unspecified: Secondary | ICD-10-CM

## 2016-07-18 DIAGNOSIS — M2390 Unspecified internal derangement of unspecified knee: Secondary | ICD-10-CM | POA: Insufficient documentation

## 2016-07-18 MED ORDER — DICLOFENAC SODIUM 75 MG PO TBEC: 75.0000 mg | DELAYED_RELEASE_TABLET | Freq: Two times a day (BID) | ORAL | 0 refills | Status: DC
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
## 2016-07-19 ENCOUNTER — Other Ambulatory Visit: Payer: Self-pay | Admitting: Medical

## 2016-07-19 LAB — CBC
HCT: 39.6 % (ref 38.5–50.0)
HEMOGLOBIN: 12.8 g/dL — AB (ref 13.2–17.1)
MCH: 29.3 pg (ref 27.0–33.0)
MCHC: 32.3 g/dL (ref 32.0–36.0)
MCV: 90.6 fL (ref 80.0–100.0)
MPV: 9.6 fL (ref 7.5–12.5)
PLATELETS: 430 10*3/uL — AB (ref 140–400)
RBC: 4.37 MIL/uL (ref 4.20–5.80)
RDW: 14.7 % (ref 11.0–15.0)
WBC: 10.5 10*3/uL (ref 4.0–10.5)

## 2016-07-19 LAB — LIPID PANEL
CHOL/HDL RATIO: 4.1 ratio (ref <=5.0)
Cholesterol: 155 mg/dL (ref 125–200)
HDL: 38 mg/dL — AB (ref >=40)
LDL CALC: 98 mg/dL (ref <130)
TRIGLYCERIDES: 94 mg/dL (ref <150)
VLDL: 19 mg/dL (ref <30)

## 2016-07-19 LAB — HEMOGLOBIN A1C
Hgb A1c MFr Bld: 5.5 % (ref <5.7)
Mean Plasma Glucose: 111 mg/dL

## 2016-07-19 LAB — COMPREHENSIVE METABOLIC PANEL
ALBUMIN: 3.5 g/dL — AB (ref 3.6–5.1)
ALT: 23 U/L (ref 9–46)
AST: 18 U/L (ref 10–40)
Alkaline Phosphatase: 72 U/L (ref 40–115)
BUN: 4 mg/dL — ABNORMAL LOW (ref 7–25)
CALCIUM: 8.6 mg/dL (ref 8.6–10.3)
CHLORIDE: 105 mmol/L (ref 98–110)
CO2: 27 mmol/L (ref 20–31)
Creat: 0.85 mg/dL (ref 0.60–1.35)
GLUCOSE: 80 mg/dL (ref 65–99)
POTASSIUM: 4.4 mmol/L (ref 3.5–5.3)
Sodium: 142 mmol/L (ref 135–146)
Total Bilirubin: 0.3 mg/dL (ref 0.2–1.2)
Total Protein: 6 g/dL — ABNORMAL LOW (ref 6.1–8.1)

## 2016-07-19 LAB — URIC ACID: URIC ACID, SERUM: 8.5 mg/dL — AB (ref 4.0–8.0)

## 2016-07-19 MED ORDER — FUROSEMIDE 20 MG PO TABS: 20.0000 mg | ORAL_TABLET | Freq: Every day | ORAL | 1 refills | Status: DC

## 2016-07-19 MED ORDER — ALLOPURINOL 100 MG PO TABS: 100.0000 mg | ORAL_TABLET | Freq: Every day | ORAL | 3 refills | Status: DC

## 2016-07-19 NOTE — Progress Notes (Signed)
See lab results msg

## 2016-08-07 DIAGNOSIS — M47812 Spondylosis without myelopathy or radiculopathy, cervical region: Secondary | ICD-10-CM | POA: Diagnosis not present

## 2016-08-07 DIAGNOSIS — Z79899 Other long term (current) drug therapy: Secondary | ICD-10-CM | POA: Diagnosis not present

## 2016-08-07 DIAGNOSIS — G894 Chronic pain syndrome: Secondary | ICD-10-CM | POA: Diagnosis not present

## 2016-08-07 DIAGNOSIS — Z79891 Long term (current) use of opiate analgesic: Secondary | ICD-10-CM | POA: Diagnosis not present

## 2016-08-07 DIAGNOSIS — M542 Cervicalgia: Secondary | ICD-10-CM | POA: Diagnosis not present

## 2016-08-07 DIAGNOSIS — M546 Pain in thoracic spine: Secondary | ICD-10-CM | POA: Diagnosis not present

## 2016-08-07 DIAGNOSIS — R51 Headache: Secondary | ICD-10-CM | POA: Diagnosis not present

## 2016-08-28 ENCOUNTER — Encounter: Payer: Medicare Other | Admitting: Medical

## 2016-09-19 DIAGNOSIS — Z79891 Long term (current) use of opiate analgesic: Secondary | ICD-10-CM | POA: Diagnosis not present

## 2016-09-19 DIAGNOSIS — M546 Pain in thoracic spine: Secondary | ICD-10-CM | POA: Diagnosis not present

## 2016-09-19 DIAGNOSIS — M47817 Spondylosis without myelopathy or radiculopathy, lumbosacral region: Secondary | ICD-10-CM | POA: Diagnosis not present

## 2016-09-19 DIAGNOSIS — M47812 Spondylosis without myelopathy or radiculopathy, cervical region: Secondary | ICD-10-CM | POA: Diagnosis not present

## 2016-09-19 DIAGNOSIS — Z79899 Other long term (current) drug therapy: Secondary | ICD-10-CM | POA: Diagnosis not present

## 2016-09-19 DIAGNOSIS — R51 Headache: Secondary | ICD-10-CM | POA: Diagnosis not present

## 2016-09-19 DIAGNOSIS — G894 Chronic pain syndrome: Secondary | ICD-10-CM | POA: Diagnosis not present

## 2016-09-21 DIAGNOSIS — Z79899 Other long term (current) drug therapy: Secondary | ICD-10-CM | POA: Diagnosis not present

## 2016-09-21 DIAGNOSIS — F4310 Post-traumatic stress disorder, unspecified: Secondary | ICD-10-CM | POA: Diagnosis not present

## 2016-09-21 DIAGNOSIS — F310 Bipolar disorder, current episode hypomanic: Secondary | ICD-10-CM | POA: Diagnosis not present

## 2016-09-22 ENCOUNTER — Emergency Department (HOSPITAL_COMMUNITY)
Admission: EM | Admit: 2016-09-22 | Discharge: 2016-09-22 | Disposition: A | Discharge status: Home / Self Care | Payer: Medicare Other | Attending: Emergency Medicine | Admitting: Emergency Medicine

## 2016-09-22 ENCOUNTER — Emergency Department (HOSPITAL_COMMUNITY): Payer: Medicare Other

## 2016-09-22 ENCOUNTER — Encounter (HOSPITAL_COMMUNITY): Payer: Self-pay | Admitting: Emergency Medicine

## 2016-09-22 DIAGNOSIS — Y999 Unspecified external cause status: Secondary | ICD-10-CM | POA: Diagnosis not present

## 2016-09-22 DIAGNOSIS — Z79899 Other long term (current) drug therapy: Secondary | ICD-10-CM | POA: Insufficient documentation

## 2016-09-22 DIAGNOSIS — Y939 Activity, unspecified: Secondary | ICD-10-CM | POA: Diagnosis not present

## 2016-09-22 DIAGNOSIS — S5001XA Contusion of right elbow, initial encounter: Secondary | ICD-10-CM | POA: Diagnosis not present

## 2016-09-22 DIAGNOSIS — E119 Type 2 diabetes mellitus without complications: Secondary | ICD-10-CM | POA: Diagnosis not present

## 2016-09-22 DIAGNOSIS — W010XXA Fall on same level from slipping, tripping and stumbling without subsequent striking against object, initial encounter: Secondary | ICD-10-CM | POA: Insufficient documentation

## 2016-09-22 DIAGNOSIS — S80211A Abrasion, right knee, initial encounter: Secondary | ICD-10-CM | POA: Diagnosis not present

## 2016-09-22 DIAGNOSIS — I10 Essential (primary) hypertension: Secondary | ICD-10-CM | POA: Diagnosis not present

## 2016-09-22 DIAGNOSIS — S5011XA Contusion of right forearm, initial encounter: Secondary | ICD-10-CM | POA: Diagnosis not present

## 2016-09-22 DIAGNOSIS — F17210 Nicotine dependence, cigarettes, uncomplicated: Secondary | ICD-10-CM | POA: Diagnosis not present

## 2016-09-22 DIAGNOSIS — S59901A Unspecified injury of right elbow, initial encounter: Secondary | ICD-10-CM | POA: Diagnosis not present

## 2016-09-22 DIAGNOSIS — Y9289 Other specified places as the place of occurrence of the external cause: Secondary | ICD-10-CM | POA: Diagnosis not present

## 2016-09-22 DIAGNOSIS — W19XXXA Unspecified fall, initial encounter: Secondary | ICD-10-CM

## 2016-09-22 MED ORDER — BACITRACIN ZINC 500 UNIT/GM EX OINT
TOPICAL_OINTMENT | CUTANEOUS | Status: AC
  Filled 2016-09-22: qty 2.7

## 2016-09-22 MED ORDER — BACITRACIN-NEOMYCIN-POLYMYXIN 400-5-5000 EX OINT
TOPICAL_OINTMENT | Freq: Once | CUTANEOUS | Status: AC
  Administered 2016-09-22: 1 via TOPICAL
  Filled 2016-09-22: qty 2

## 2016-09-22 NOTE — ED Triage Notes (Signed)
Pt reports falling when his steps became disconnected from the porch. Injury to R elbow and arm.

## 2016-09-22 NOTE — Discharge Instructions (Signed)
Follow up with Dr. Aline Brochure or with your primary care doctor in the next week. Return here sooner for worsening symptoms. Take you hydrocodone and ibuprofen for pain and inflammation. Do not drive while taking your narcotic.
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
## 2016-10-14 ENCOUNTER — Other Ambulatory Visit: Payer: Self-pay | Admitting: Medical

## 2016-10-22 DIAGNOSIS — M47812 Spondylosis without myelopathy or radiculopathy, cervical region: Secondary | ICD-10-CM | POA: Diagnosis not present

## 2016-10-22 DIAGNOSIS — Z79899 Other long term (current) drug therapy: Secondary | ICD-10-CM | POA: Diagnosis not present

## 2016-10-22 DIAGNOSIS — Z79891 Long term (current) use of opiate analgesic: Secondary | ICD-10-CM | POA: Diagnosis not present

## 2016-10-22 DIAGNOSIS — G894 Chronic pain syndrome: Secondary | ICD-10-CM | POA: Diagnosis not present

## 2016-10-22 DIAGNOSIS — M47817 Spondylosis without myelopathy or radiculopathy, lumbosacral region: Secondary | ICD-10-CM | POA: Diagnosis not present

## 2016-10-22 DIAGNOSIS — M546 Pain in thoracic spine: Secondary | ICD-10-CM | POA: Diagnosis not present

## 2016-10-27 ENCOUNTER — Emergency Department (HOSPITAL_COMMUNITY): Payer: Medicare Other

## 2016-10-27 ENCOUNTER — Inpatient Hospital Stay (HOSPITAL_COMMUNITY)
Admission: EM | Admit: 2016-10-27 | Discharge: 2016-11-08 | DRG: 853 | Disposition: A | Discharge status: Home Health | Payer: Medicare Other | Attending: Internal Medicine | Admitting: Internal Medicine

## 2016-10-27 ENCOUNTER — Encounter (HOSPITAL_COMMUNITY): Payer: Self-pay | Admitting: *Deleted

## 2016-10-27 DIAGNOSIS — M109 Gout, unspecified: Secondary | ICD-10-CM | POA: Diagnosis present

## 2016-10-27 DIAGNOSIS — Z452 Encounter for adjustment and management of vascular access device: Secondary | ICD-10-CM

## 2016-10-27 DIAGNOSIS — E785 Hyperlipidemia, unspecified: Secondary | ICD-10-CM | POA: Diagnosis present

## 2016-10-27 DIAGNOSIS — R7989 Other specified abnormal findings of blood chemistry: Secondary | ICD-10-CM

## 2016-10-27 DIAGNOSIS — R778 Other specified abnormalities of plasma proteins: Secondary | ICD-10-CM

## 2016-10-27 DIAGNOSIS — R0602 Shortness of breath: Secondary | ICD-10-CM | POA: Diagnosis not present

## 2016-10-27 DIAGNOSIS — R451 Restlessness and agitation: Secondary | ICD-10-CM | POA: Diagnosis not present

## 2016-10-27 DIAGNOSIS — E874 Mixed disorder of acid-base balance: Secondary | ICD-10-CM | POA: Diagnosis present

## 2016-10-27 DIAGNOSIS — I214 Non-ST elevation (NSTEMI) myocardial infarction: Secondary | ICD-10-CM | POA: Diagnosis not present

## 2016-10-27 DIAGNOSIS — J851 Abscess of lung with pneumonia: Secondary | ICD-10-CM | POA: Diagnosis not present

## 2016-10-27 DIAGNOSIS — Z23 Encounter for immunization: Secondary | ICD-10-CM

## 2016-10-27 DIAGNOSIS — J9811 Atelectasis: Secondary | ICD-10-CM | POA: Diagnosis present

## 2016-10-27 DIAGNOSIS — D649 Anemia, unspecified: Secondary | ICD-10-CM | POA: Diagnosis present

## 2016-10-27 DIAGNOSIS — Z4682 Encounter for fitting and adjustment of non-vascular catheter: Secondary | ICD-10-CM | POA: Diagnosis not present

## 2016-10-27 DIAGNOSIS — Z9689 Presence of other specified functional implants: Secondary | ICD-10-CM

## 2016-10-27 DIAGNOSIS — F319 Bipolar disorder, unspecified: Secondary | ICD-10-CM | POA: Diagnosis present

## 2016-10-27 DIAGNOSIS — J440 Chronic obstructive pulmonary disease with acute lower respiratory infection: Secondary | ICD-10-CM | POA: Diagnosis present

## 2016-10-27 DIAGNOSIS — G4733 Obstructive sleep apnea (adult) (pediatric): Secondary | ICD-10-CM | POA: Diagnosis present

## 2016-10-27 DIAGNOSIS — R0902 Hypoxemia: Secondary | ICD-10-CM

## 2016-10-27 DIAGNOSIS — N179 Acute kidney failure, unspecified: Secondary | ICD-10-CM | POA: Diagnosis not present

## 2016-10-27 DIAGNOSIS — W19XXXA Unspecified fall, initial encounter: Secondary | ICD-10-CM

## 2016-10-27 DIAGNOSIS — R6521 Severe sepsis with septic shock: Secondary | ICD-10-CM | POA: Diagnosis not present

## 2016-10-27 DIAGNOSIS — Z0000 Encounter for general adult medical examination without abnormal findings: Secondary | ICD-10-CM

## 2016-10-27 DIAGNOSIS — E119 Type 2 diabetes mellitus without complications: Secondary | ICD-10-CM | POA: Diagnosis present

## 2016-10-27 DIAGNOSIS — Z9114 Patient's other noncompliance with medication regimen: Secondary | ICD-10-CM

## 2016-10-27 DIAGNOSIS — K5900 Constipation, unspecified: Secondary | ICD-10-CM | POA: Diagnosis present

## 2016-10-27 DIAGNOSIS — I10 Essential (primary) hypertension: Secondary | ICD-10-CM | POA: Diagnosis present

## 2016-10-27 DIAGNOSIS — J9601 Acute respiratory failure with hypoxia: Secondary | ICD-10-CM

## 2016-10-27 DIAGNOSIS — Z9289 Personal history of other medical treatment: Secondary | ICD-10-CM

## 2016-10-27 DIAGNOSIS — A419 Sepsis, unspecified organism: Principal | ICD-10-CM | POA: Diagnosis present

## 2016-10-27 DIAGNOSIS — J9602 Acute respiratory failure with hypercapnia: Secondary | ICD-10-CM | POA: Diagnosis not present

## 2016-10-27 DIAGNOSIS — J9690 Respiratory failure, unspecified, unspecified whether with hypoxia or hypercapnia: Secondary | ICD-10-CM

## 2016-10-27 DIAGNOSIS — J9600 Acute respiratory failure, unspecified whether with hypoxia or hypercapnia: Secondary | ICD-10-CM

## 2016-10-27 DIAGNOSIS — R064 Hyperventilation: Secondary | ICD-10-CM | POA: Diagnosis not present

## 2016-10-27 DIAGNOSIS — R4182 Altered mental status, unspecified: Secondary | ICD-10-CM

## 2016-10-27 DIAGNOSIS — R008 Other abnormalities of heart beat: Secondary | ICD-10-CM | POA: Diagnosis present

## 2016-10-27 DIAGNOSIS — J90 Pleural effusion, not elsewhere classified: Secondary | ICD-10-CM

## 2016-10-27 DIAGNOSIS — Z880 Allergy status to penicillin: Secondary | ICD-10-CM

## 2016-10-27 DIAGNOSIS — J869 Pyothorax without fistula: Secondary | ICD-10-CM

## 2016-10-27 DIAGNOSIS — F419 Anxiety disorder, unspecified: Secondary | ICD-10-CM | POA: Diagnosis present

## 2016-10-27 DIAGNOSIS — E669 Obesity, unspecified: Secondary | ICD-10-CM | POA: Diagnosis present

## 2016-10-27 DIAGNOSIS — F17210 Nicotine dependence, cigarettes, uncomplicated: Secondary | ICD-10-CM | POA: Diagnosis present

## 2016-10-27 DIAGNOSIS — G92 Toxic encephalopathy: Secondary | ICD-10-CM | POA: Diagnosis present

## 2016-10-27 DIAGNOSIS — M1990 Unspecified osteoarthritis, unspecified site: Secondary | ICD-10-CM | POA: Diagnosis present

## 2016-10-27 DIAGNOSIS — J939 Pneumothorax, unspecified: Secondary | ICD-10-CM

## 2016-10-27 DIAGNOSIS — J189 Pneumonia, unspecified organism: Secondary | ICD-10-CM

## 2016-10-27 DIAGNOSIS — R571 Hypovolemic shock: Secondary | ICD-10-CM | POA: Diagnosis present

## 2016-10-27 DIAGNOSIS — Z86718 Personal history of other venous thrombosis and embolism: Secondary | ICD-10-CM

## 2016-10-27 DIAGNOSIS — R1319 Other dysphagia: Secondary | ICD-10-CM | POA: Diagnosis present

## 2016-10-27 DIAGNOSIS — Z6832 Body mass index (BMI) 32.0-32.9, adult: Secondary | ICD-10-CM

## 2016-10-27 DIAGNOSIS — G8929 Other chronic pain: Secondary | ICD-10-CM | POA: Diagnosis present

## 2016-10-27 LAB — CBC WITH DIFFERENTIAL/PLATELET
BASOS ABS: 0 10*3/uL (ref 0.0–0.1)
BASOS PCT: 0 %
EOS ABS: 0 10*3/uL (ref 0.0–0.7)
Eosinophils Relative: 0 %
HCT: 42.1 % (ref 39.0–52.0)
Hemoglobin: 13.8 g/dL (ref 13.0–17.0)
Lymphocytes Relative: 3 %
Lymphs Abs: 0.9 10*3/uL (ref 0.7–4.0)
MCH: 30.6 pg (ref 26.0–34.0)
MCHC: 32.8 g/dL (ref 30.0–36.0)
MCV: 93.3 fL (ref 78.0–100.0)
MONO ABS: 2 10*3/uL — AB (ref 0.1–1.0)
MONOS PCT: 6 %
Neutro Abs: 30.1 10*3/uL — ABNORMAL HIGH (ref 1.7–7.7)
Neutrophils Relative %: 91 %
PLATELETS: 273 10*3/uL (ref 150–400)
RBC: 4.51 MIL/uL (ref 4.22–5.81)
RDW: 13.5 % (ref 11.5–15.5)
WBC: 33 10*3/uL — ABNORMAL HIGH (ref 4.0–10.5)

## 2016-10-27 LAB — COMPREHENSIVE METABOLIC PANEL
ALK PHOS: 65 U/L (ref 38–126)
ALT: 16 U/L — AB (ref 17–63)
ANION GAP: 12 (ref 5–15)
AST: 30 U/L (ref 15–41)
Albumin: 2.9 g/dL — ABNORMAL LOW (ref 3.5–5.0)
BILIRUBIN TOTAL: 0.5 mg/dL (ref 0.3–1.2)
BUN: 21 mg/dL — ABNORMAL HIGH (ref 6–20)
CALCIUM: 8.3 mg/dL — AB (ref 8.9–10.3)
CO2: 23 mmol/L (ref 22–32)
CREATININE: 2.23 mg/dL — AB (ref 0.61–1.24)
Chloride: 97 mmol/L — ABNORMAL LOW (ref 101–111)
GFR calc non Af Amer: 35 mL/min — ABNORMAL LOW (ref >60)
GFR, EST AFRICAN AMERICAN: 41 mL/min — AB (ref >60)
GLUCOSE: 120 mg/dL — AB (ref 65–99)
Potassium: 3.8 mmol/L (ref 3.5–5.1)
SODIUM: 132 mmol/L — AB (ref 135–145)
TOTAL PROTEIN: 6.8 g/dL (ref 6.5–8.1)

## 2016-10-27 LAB — I-STAT CHEM 8, ED
BUN: 22 mg/dL — ABNORMAL HIGH (ref 6–20)
Calcium, Ion: 1.08 mmol/L — ABNORMAL LOW (ref 1.15–1.40)
Chloride: 98 mmol/L — ABNORMAL LOW (ref 101–111)
Creatinine, Ser: 2.1 mg/dL — ABNORMAL HIGH (ref 0.61–1.24)
Glucose, Bld: 120 mg/dL — ABNORMAL HIGH (ref 65–99)
HEMATOCRIT: 45 % (ref 39.0–52.0)
HEMOGLOBIN: 15.3 g/dL (ref 13.0–17.0)
POTASSIUM: 4.1 mmol/L (ref 3.5–5.1)
SODIUM: 135 mmol/L (ref 135–145)
TCO2: 24 mmol/L (ref 0–100)

## 2016-10-27 LAB — I-STAT CG4 LACTIC ACID, ED: Lactic Acid, Venous: 3.99 mmol/L (ref 0.5–1.9)

## 2016-10-27 LAB — I-STAT TROPONIN, ED: TROPONIN I, POC: 0 ng/mL (ref 0.00–0.08)

## 2016-10-27 MED ORDER — LEVALBUTEROL HCL 0.63 MG/3ML IN NEBU
INHALATION_SOLUTION | RESPIRATORY_TRACT | Status: AC
  Administered 2016-10-27: 0.63 mg
  Filled 2016-10-27: qty 3

## 2016-10-27 MED ORDER — SODIUM CHLORIDE 0.9 % IV BOLUS (SEPSIS)
4000.0000 mL | Freq: Once | INTRAVENOUS | Status: AC
  Administered 2016-10-27: 4000 mL via INTRAVENOUS

## 2016-10-27 MED ORDER — VANCOMYCIN HCL IN DEXTROSE 1-5 GM/200ML-% IV SOLN
1000.0000 mg | Freq: Once | INTRAVENOUS | Status: AC
  Administered 2016-10-28: 1000 mg via INTRAVENOUS
  Filled 2016-10-27: qty 200

## 2016-10-27 MED ORDER — IPRATROPIUM BROMIDE 0.02 % IN SOLN
RESPIRATORY_TRACT | Status: AC
  Administered 2016-10-27: 0.5 mg
  Filled 2016-10-27: qty 2.5

## 2016-10-27 MED ORDER — PROPOFOL 1000 MG/100ML IV EMUL
5.0000 ug/kg/min | INTRAVENOUS | Status: DC
  Administered 2016-10-27: 10 ug/kg/min via INTRAVENOUS
  Filled 2016-10-27: qty 100

## 2016-10-27 MED ORDER — ROCURONIUM BROMIDE 50 MG/5ML IV SOLN
100.0000 mg | Freq: Once | INTRAVENOUS | Status: AC
  Administered 2016-10-27: 100 mg via INTRAVENOUS

## 2016-10-27 MED ORDER — ACETAMINOPHEN 650 MG RE SUPP
975.0000 mg | Freq: Once | RECTAL | Status: AC
  Administered 2016-10-27: 975 mg via RECTAL
  Filled 2016-10-27: qty 1

## 2016-10-27 MED ORDER — ETOMIDATE 2 MG/ML IV SOLN
20.0000 mg | Freq: Once | INTRAVENOUS | Status: AC
  Administered 2016-10-27: 20 mg via INTRAVENOUS

## 2016-10-27 NOTE — ED Triage Notes (Signed)
RA sats 86%

## 2016-10-27 NOTE — ED Triage Notes (Signed)
Pt's wife states pt with gasping breathing since 1400 today, refused to come earlier. Pt very pale and very shallow breathing, diaphoresis.  Fever on WEd and Thurs, yesterday congestion and cough

## 2016-10-28 ENCOUNTER — Inpatient Hospital Stay (HOSPITAL_COMMUNITY): Payer: Medicare Other

## 2016-10-28 DIAGNOSIS — Z978 Presence of other specified devices: Secondary | ICD-10-CM | POA: Diagnosis not present

## 2016-10-28 DIAGNOSIS — J9811 Atelectasis: Secondary | ICD-10-CM | POA: Diagnosis present

## 2016-10-28 DIAGNOSIS — R0602 Shortness of breath: Secondary | ICD-10-CM | POA: Diagnosis not present

## 2016-10-28 DIAGNOSIS — R0600 Dyspnea, unspecified: Secondary | ICD-10-CM | POA: Diagnosis not present

## 2016-10-28 DIAGNOSIS — J439 Emphysema, unspecified: Secondary | ICD-10-CM | POA: Diagnosis not present

## 2016-10-28 DIAGNOSIS — G92 Toxic encephalopathy: Secondary | ICD-10-CM | POA: Diagnosis not present

## 2016-10-28 DIAGNOSIS — G8929 Other chronic pain: Secondary | ICD-10-CM | POA: Diagnosis present

## 2016-10-28 DIAGNOSIS — R6521 Severe sepsis with septic shock: Secondary | ICD-10-CM | POA: Diagnosis not present

## 2016-10-28 DIAGNOSIS — M109 Gout, unspecified: Secondary | ICD-10-CM | POA: Diagnosis present

## 2016-10-28 DIAGNOSIS — A419 Sepsis, unspecified organism: Secondary | ICD-10-CM | POA: Diagnosis not present

## 2016-10-28 DIAGNOSIS — J869 Pyothorax without fistula: Secondary | ICD-10-CM | POA: Diagnosis not present

## 2016-10-28 DIAGNOSIS — J9601 Acute respiratory failure with hypoxia: Secondary | ICD-10-CM

## 2016-10-28 DIAGNOSIS — Z9114 Patient's other noncompliance with medication regimen: Secondary | ICD-10-CM | POA: Diagnosis not present

## 2016-10-28 DIAGNOSIS — E874 Mixed disorder of acid-base balance: Secondary | ICD-10-CM | POA: Diagnosis not present

## 2016-10-28 DIAGNOSIS — R4182 Altered mental status, unspecified: Secondary | ICD-10-CM | POA: Diagnosis not present

## 2016-10-28 DIAGNOSIS — F319 Bipolar disorder, unspecified: Secondary | ICD-10-CM | POA: Diagnosis present

## 2016-10-28 DIAGNOSIS — J9602 Acute respiratory failure with hypercapnia: Secondary | ICD-10-CM | POA: Diagnosis not present

## 2016-10-28 DIAGNOSIS — J440 Chronic obstructive pulmonary disease with acute lower respiratory infection: Secondary | ICD-10-CM | POA: Diagnosis not present

## 2016-10-28 DIAGNOSIS — J181 Lobar pneumonia, unspecified organism: Secondary | ICD-10-CM | POA: Diagnosis not present

## 2016-10-28 DIAGNOSIS — K5900 Constipation, unspecified: Secondary | ICD-10-CM | POA: Diagnosis present

## 2016-10-28 DIAGNOSIS — R404 Transient alteration of awareness: Secondary | ICD-10-CM | POA: Diagnosis not present

## 2016-10-28 DIAGNOSIS — J189 Pneumonia, unspecified organism: Secondary | ICD-10-CM | POA: Diagnosis not present

## 2016-10-28 DIAGNOSIS — S299XXA Unspecified injury of thorax, initial encounter: Secondary | ICD-10-CM | POA: Diagnosis not present

## 2016-10-28 DIAGNOSIS — I10 Essential (primary) hypertension: Secondary | ICD-10-CM | POA: Diagnosis present

## 2016-10-28 DIAGNOSIS — G4733 Obstructive sleep apnea (adult) (pediatric): Secondary | ICD-10-CM | POA: Diagnosis present

## 2016-10-28 DIAGNOSIS — Z23 Encounter for immunization: Secondary | ICD-10-CM | POA: Diagnosis not present

## 2016-10-28 DIAGNOSIS — N179 Acute kidney failure, unspecified: Secondary | ICD-10-CM

## 2016-10-28 DIAGNOSIS — R1319 Other dysphagia: Secondary | ICD-10-CM | POA: Diagnosis not present

## 2016-10-28 DIAGNOSIS — Z01810 Encounter for preprocedural cardiovascular examination: Secondary | ICD-10-CM | POA: Diagnosis not present

## 2016-10-28 DIAGNOSIS — R0902 Hypoxemia: Secondary | ICD-10-CM | POA: Diagnosis not present

## 2016-10-28 DIAGNOSIS — D649 Anemia, unspecified: Secondary | ICD-10-CM | POA: Diagnosis present

## 2016-10-28 DIAGNOSIS — M1990 Unspecified osteoarthritis, unspecified site: Secondary | ICD-10-CM | POA: Diagnosis present

## 2016-10-28 DIAGNOSIS — R05 Cough: Secondary | ICD-10-CM | POA: Diagnosis not present

## 2016-10-28 DIAGNOSIS — Z4682 Encounter for fitting and adjustment of non-vascular catheter: Secondary | ICD-10-CM | POA: Diagnosis not present

## 2016-10-28 DIAGNOSIS — Z86718 Personal history of other venous thrombosis and embolism: Secondary | ICD-10-CM | POA: Diagnosis not present

## 2016-10-28 DIAGNOSIS — R748 Abnormal levels of other serum enzymes: Secondary | ICD-10-CM | POA: Diagnosis not present

## 2016-10-28 DIAGNOSIS — R571 Hypovolemic shock: Secondary | ICD-10-CM | POA: Diagnosis not present

## 2016-10-28 DIAGNOSIS — B957 Other staphylococcus as the cause of diseases classified elsewhere: Secondary | ICD-10-CM | POA: Diagnosis not present

## 2016-10-28 DIAGNOSIS — E119 Type 2 diabetes mellitus without complications: Secondary | ICD-10-CM | POA: Diagnosis present

## 2016-10-28 DIAGNOSIS — Z95828 Presence of other vascular implants and grafts: Secondary | ICD-10-CM | POA: Diagnosis not present

## 2016-10-28 DIAGNOSIS — J90 Pleural effusion, not elsewhere classified: Secondary | ICD-10-CM | POA: Diagnosis not present

## 2016-10-28 DIAGNOSIS — Z452 Encounter for adjustment and management of vascular access device: Secondary | ICD-10-CM | POA: Diagnosis not present

## 2016-10-28 DIAGNOSIS — J851 Abscess of lung with pneumonia: Secondary | ICD-10-CM | POA: Diagnosis not present

## 2016-10-28 DIAGNOSIS — J9690 Respiratory failure, unspecified, unspecified whether with hypoxia or hypercapnia: Secondary | ICD-10-CM | POA: Diagnosis not present

## 2016-10-28 DIAGNOSIS — J9600 Acute respiratory failure, unspecified whether with hypoxia or hypercapnia: Secondary | ICD-10-CM | POA: Diagnosis not present

## 2016-10-28 DIAGNOSIS — I214 Non-ST elevation (NSTEMI) myocardial infarction: Secondary | ICD-10-CM | POA: Diagnosis not present

## 2016-10-28 DIAGNOSIS — D72829 Elevated white blood cell count, unspecified: Secondary | ICD-10-CM | POA: Diagnosis not present

## 2016-10-28 LAB — BLOOD GAS, ARTERIAL
ACID-BASE DEFICIT: 7.2 mmol/L — AB (ref 0.0–2.0)
Acid-base deficit: 4.7 mmol/L — ABNORMAL HIGH (ref 0.0–2.0)
Acid-base deficit: 5.3 mmol/L — ABNORMAL HIGH (ref 0.0–2.0)
Acid-base deficit: 9.2 mmol/L — ABNORMAL HIGH (ref 0.0–2.0)
BICARBONATE: 16.5 mmol/L — AB (ref 20.0–28.0)
Bicarbonate: 18.2 mmol/L — ABNORMAL LOW (ref 20.0–28.0)
Bicarbonate: 19.7 mmol/L — ABNORMAL LOW (ref 20.0–28.0)
Bicarbonate: 19.8 mmol/L — ABNORMAL LOW (ref 20.0–28.0)
DRAWN BY: 105551
DRAWN BY: 398661
DRAWN BY: 24485
Drawn by: 105551
FIO2: 100
FIO2: 100
FIO2: 70
FIO2: 70
LHR: 26 {breaths}/min
MECHVT: 530 mL
MECHVT: 530 mL
MECHVT: 530 mL
O2 SAT: 90.9 %
O2 SAT: 93 %
O2 Saturation: 95.9 %
O2 Saturation: 96.7 %
PATIENT TEMPERATURE: 40.2
PATIENT TEMPERATURE: 40.2
PCO2 ART: 44.8 mmHg (ref 32.0–48.0)
PCO2 ART: 49.7 mmHg — AB (ref 32.0–48.0)
PCO2 ART: 59.6 mmHg — AB (ref 32.0–48.0)
PEEP/CPAP: 7 cmH2O
PEEP: 5 cmH2O
PEEP: 5 cmH2O
PEEP: 5 cmH2O
PH ART: 7.203 — AB (ref 7.350–7.450)
PO2 ART: 112 mmHg — AB (ref 83.0–108.0)
PO2 ART: 86 mmHg (ref 83.0–108.0)
PO2 ART: 98.5 mmHg (ref 83.0–108.0)
Patient temperature: 98.6
Patient temperature: 102.6
RATE: 20 resp/min
RATE: 22 resp/min
RATE: 22 resp/min
VT: 530 mL
pCO2 arterial: 38 mmHg (ref 32.0–48.0)
pH, Arterial: 7.218 — ABNORMAL LOW (ref 7.350–7.450)
pH, Arterial: 7.261 — ABNORMAL LOW (ref 7.350–7.450)
pH, Arterial: 7.283 — ABNORMAL LOW (ref 7.350–7.450)
pO2, Arterial: 79.5 mmHg — ABNORMAL LOW (ref 83.0–108.0)

## 2016-10-28 LAB — PHOSPHORUS: PHOSPHORUS: 3.2 mg/dL (ref 2.5–4.6)

## 2016-10-28 LAB — RAPID URINE DRUG SCREEN, HOSP PERFORMED
AMPHETAMINES: POSITIVE — AB
BARBITURATES: NOT DETECTED
BENZODIAZEPINES: POSITIVE — AB
Cocaine: NOT DETECTED
Opiates: POSITIVE — AB
Tetrahydrocannabinol: NOT DETECTED

## 2016-10-28 LAB — COMPREHENSIVE METABOLIC PANEL
ALBUMIN: 2.1 g/dL — AB (ref 3.5–5.0)
ALT: 18 U/L (ref 17–63)
AST: 32 U/L (ref 15–41)
Alkaline Phosphatase: 55 U/L (ref 38–126)
Anion gap: 11 (ref 5–15)
BUN: 22 mg/dL — AB (ref 6–20)
CHLORIDE: 106 mmol/L (ref 101–111)
CO2: 18 mmol/L — ABNORMAL LOW (ref 22–32)
Calcium: 7.1 mg/dL — ABNORMAL LOW (ref 8.9–10.3)
Creatinine, Ser: 2.2 mg/dL — ABNORMAL HIGH (ref 0.61–1.24)
GFR calc Af Amer: 41 mL/min — ABNORMAL LOW (ref >60)
GFR calc non Af Amer: 36 mL/min — ABNORMAL LOW (ref >60)
GLUCOSE: 144 mg/dL — AB (ref 65–99)
POTASSIUM: 4.7 mmol/L (ref 3.5–5.1)
Sodium: 135 mmol/L (ref 135–145)
Total Bilirubin: 1.1 mg/dL (ref 0.3–1.2)
Total Protein: 5.2 g/dL — ABNORMAL LOW (ref 6.5–8.1)

## 2016-10-28 LAB — I-STAT CG4 LACTIC ACID, ED: LACTIC ACID, VENOUS: 2.67 mmol/L — AB (ref 0.5–1.9)

## 2016-10-28 LAB — URINALYSIS, ROUTINE W REFLEX MICROSCOPIC
BILIRUBIN URINE: NEGATIVE
GLUCOSE, UA: NEGATIVE mg/dL
Leukocytes, UA: NEGATIVE
Nitrite: NEGATIVE
PH: 5.5 (ref 5.0–8.0)
Protein, ur: 30 mg/dL — AB

## 2016-10-28 LAB — TROPONIN I
TROPONIN I: 0.79 ng/mL — AB (ref <0.03)
Troponin I: 0.68 ng/mL (ref <0.03)
Troponin I: 2.87 ng/mL (ref <0.03)

## 2016-10-28 LAB — MAGNESIUM: MAGNESIUM: 1.2 mg/dL — AB (ref 1.7–2.4)

## 2016-10-28 LAB — URINE MICROSCOPIC-ADD ON

## 2016-10-28 LAB — GLUCOSE, CAPILLARY
GLUCOSE-CAPILLARY: 185 mg/dL — AB (ref 65–99)
GLUCOSE-CAPILLARY: 265 mg/dL — AB (ref 65–99)
Glucose-Capillary: 169 mg/dL — ABNORMAL HIGH (ref 65–99)
Glucose-Capillary: 126 mg/dL — ABNORMAL HIGH (ref 65–99)

## 2016-10-28 LAB — MRSA PCR SCREENING: MRSA BY PCR: POSITIVE — AB

## 2016-10-28 LAB — LACTIC ACID, PLASMA
LACTIC ACID, VENOUS: 2.3 mmol/L — AB (ref 0.5–1.9)
Lactic Acid, Venous: 2.5 mmol/L (ref 0.5–1.9)

## 2016-10-28 LAB — INFLUENZA PANEL BY PCR (TYPE A & B)
INFLAPCR: NEGATIVE
Influenza B By PCR: NEGATIVE

## 2016-10-28 LAB — CBC
HCT: 39.2 % (ref 39.0–52.0)
Hemoglobin: 12.4 g/dL — ABNORMAL LOW (ref 13.0–17.0)
MCH: 29.5 pg (ref 26.0–34.0)
MCHC: 31.6 g/dL (ref 30.0–36.0)
MCV: 93.1 fL (ref 78.0–100.0)
PLATELETS: 260 10*3/uL (ref 150–400)
RBC: 4.21 MIL/uL — ABNORMAL LOW (ref 4.22–5.81)
RDW: 14 % (ref 11.5–15.5)
WBC: 28.4 10*3/uL — AB (ref 4.0–10.5)

## 2016-10-28 LAB — CORTISOL: CORTISOL PLASMA: 69.9 ug/dL

## 2016-10-28 LAB — STREP PNEUMONIAE URINARY ANTIGEN: STREP PNEUMO URINARY ANTIGEN: NEGATIVE

## 2016-10-28 LAB — PROCALCITONIN: PROCALCITONIN: 56.52 ng/mL

## 2016-10-28 MED ORDER — ORAL CARE MOUTH RINSE
15.0000 mL | Freq: Four times a day (QID) | OROMUCOSAL | Status: DC
  Administered 2016-10-28 (×2): 15 mL via OROMUCOSAL

## 2016-10-28 MED ORDER — LEVOFLOXACIN IN D5W 750 MG/150ML IV SOLN
750.0000 mg | Freq: Once | INTRAVENOUS | Status: AC
  Administered 2016-10-28: 750 mg via INTRAVENOUS
  Filled 2016-10-28: qty 150

## 2016-10-28 MED ORDER — IPRATROPIUM-ALBUTEROL 0.5-2.5 (3) MG/3ML IN SOLN
3.0000 mL | Freq: Four times a day (QID) | RESPIRATORY_TRACT | Status: DC
  Administered 2016-10-28 – 2016-11-01 (×15): 3 mL via RESPIRATORY_TRACT
  Filled 2016-10-28 (×16): qty 3

## 2016-10-28 MED ORDER — AZTREONAM 1 G IJ SOLR
1.0000 g | Freq: Three times a day (TID) | INTRAMUSCULAR | Status: DC
  Administered 2016-10-28 – 2016-10-29 (×5): 1 g via INTRAVENOUS
  Filled 2016-10-28 (×10): qty 1

## 2016-10-28 MED ORDER — CHLORHEXIDINE GLUCONATE CLOTH 2 % EX PADS
6.0000 | MEDICATED_PAD | Freq: Every day | CUTANEOUS | Status: DC
  Administered 2016-10-29: 6 via TOPICAL

## 2016-10-28 MED ORDER — LEVALBUTEROL HCL 0.63 MG/3ML IN NEBU
INHALATION_SOLUTION | RESPIRATORY_TRACT | Status: AC
  Administered 2016-10-28: 0.63 mg
  Filled 2016-10-28: qty 3

## 2016-10-28 MED ORDER — NOREPINEPHRINE BITARTRATE 1 MG/ML IV SOLN
0.0000 ug/min | INTRAVENOUS | Status: DC
  Administered 2016-10-28: 15 ug/min via INTRAVENOUS
  Administered 2016-10-28: 40 ug/min via INTRAVENOUS
  Filled 2016-10-28 (×3): qty 16

## 2016-10-28 MED ORDER — FENTANYL CITRATE (PF) 100 MCG/2ML IJ SOLN
50.0000 ug | Freq: Once | INTRAMUSCULAR | Status: DC
Start: 2016-10-28 — End: 2016-11-02

## 2016-10-28 MED ORDER — ORAL CARE MOUTH RINSE: 15.0000 mL | Freq: Four times a day (QID) | OROMUCOSAL | Status: DC

## 2016-10-28 MED ORDER — VANCOMYCIN HCL IN DEXTROSE 1-5 GM/200ML-% IV SOLN
1000.0000 mg | Freq: Two times a day (BID) | INTRAVENOUS | Status: DC
  Administered 2016-10-28 – 2016-10-29 (×4): 1000 mg via INTRAVENOUS
  Filled 2016-10-28 (×7): qty 200

## 2016-10-28 MED ORDER — IPRATROPIUM BROMIDE 0.02 % IN SOLN
RESPIRATORY_TRACT | Status: AC
  Administered 2016-10-28: 0.5 mg
  Filled 2016-10-28: qty 2.5

## 2016-10-28 MED ORDER — FAMOTIDINE IN NACL 20-0.9 MG/50ML-% IV SOLN
20.0000 mg | Freq: Two times a day (BID) | INTRAVENOUS | Status: DC
  Administered 2016-10-28 – 2016-11-06 (×19): 20 mg via INTRAVENOUS
  Filled 2016-10-28 (×22): qty 50

## 2016-10-28 MED ORDER — HEPARIN SODIUM (PORCINE) 5000 UNIT/ML IJ SOLN
5000.0000 [IU] | Freq: Three times a day (TID) | INTRAMUSCULAR | Status: DC
  Administered 2016-10-28 (×2): 5000 [IU] via SUBCUTANEOUS
  Filled 2016-10-28 (×2): qty 1

## 2016-10-28 MED ORDER — LEVOFLOXACIN IN D5W 750 MG/150ML IV SOLN
750.0000 mg | INTRAVENOUS | Status: DC
Start: 2016-10-29 — End: 2016-10-28

## 2016-10-28 MED ORDER — MIDAZOLAM HCL 2 MG/2ML IJ SOLN
2.0000 mg | Freq: Once | INTRAMUSCULAR | Status: AC
  Administered 2016-10-28: 2 mg via INTRAVENOUS

## 2016-10-28 MED ORDER — CHLORHEXIDINE GLUCONATE 0.12% ORAL RINSE (MEDLINE KIT): 15.0000 mL | Freq: Two times a day (BID) | OROMUCOSAL | Status: DC

## 2016-10-28 MED ORDER — NOREPINEPHRINE BITARTRATE 1 MG/ML IV SOLN
INTRAVENOUS | Status: AC
  Filled 2016-10-28: qty 4

## 2016-10-28 MED ORDER — NOREPINEPHRINE BITARTRATE 1 MG/ML IV SOLN
0.0000 ug/min | INTRAVENOUS | Status: DC
  Administered 2016-10-28: 30 ug/min via INTRAVENOUS
  Administered 2016-10-28: 5 ug/min via INTRAVENOUS
  Administered 2016-10-28: 40 ug/min via INTRAVENOUS
  Filled 2016-10-28 (×2): qty 4

## 2016-10-28 MED ORDER — INSULIN ASPART 100 UNIT/ML ~~LOC~~ SOLN
0.0000 [IU] | SUBCUTANEOUS | Status: DC
  Administered 2016-10-28: 2 [IU] via SUBCUTANEOUS
  Administered 2016-10-28: 8 [IU] via SUBCUTANEOUS
  Administered 2016-10-28: 3 [IU] via SUBCUTANEOUS
  Administered 2016-10-29 (×2): 2 [IU] via SUBCUTANEOUS
  Administered 2016-10-29: 3 [IU] via SUBCUTANEOUS
  Administered 2016-10-29: 5 [IU] via SUBCUTANEOUS
  Administered 2016-10-30 – 2016-11-08 (×8): 2 [IU] via SUBCUTANEOUS

## 2016-10-28 MED ORDER — HEPARIN SODIUM (PORCINE) 5000 UNIT/ML IJ SOLN: 5000.0000 [IU] | Freq: Three times a day (TID) | INTRAMUSCULAR | Status: DC

## 2016-10-28 MED ORDER — AZTREONAM 1 G IJ SOLR
INTRAMUSCULAR | Status: AC
  Filled 2016-10-28: qty 1

## 2016-10-28 MED ORDER — BUDESONIDE 0.5 MG/2ML IN SUSP
0.5000 mg | Freq: Two times a day (BID) | RESPIRATORY_TRACT | Status: DC
  Administered 2016-10-28 – 2016-11-08 (×22): 0.5 mg via RESPIRATORY_TRACT
  Filled 2016-10-28 (×25): qty 2

## 2016-10-28 MED ORDER — FENTANYL 2500MCG IN NS 250ML (10MCG/ML) PREMIX INFUSION
25.0000 ug/h | INTRAVENOUS | Status: DC
  Administered 2016-10-28: 25 ug/h via INTRAVENOUS
  Administered 2016-10-29: 75 ug/h via INTRAVENOUS
  Administered 2016-10-30 – 2016-10-31 (×5): 300 ug/h via INTRAVENOUS
  Administered 2016-10-31 – 2016-11-01 (×2): 350 ug/h via INTRAVENOUS
  Filled 2016-10-28 (×10): qty 250

## 2016-10-28 MED ORDER — ACETAMINOPHEN 650 MG RE SUPP
650.0000 mg | Freq: Four times a day (QID) | RECTAL | Status: DC | PRN
  Administered 2016-10-28 (×2): 650 mg via RECTAL
  Filled 2016-10-28 (×2): qty 1

## 2016-10-28 MED ORDER — MIDAZOLAM HCL 2 MG/2ML IJ SOLN
INTRAMUSCULAR | Status: AC
  Filled 2016-10-28: qty 2

## 2016-10-28 MED ORDER — MIDAZOLAM 50MG/50ML (1MG/ML) PREMIX INFUSION
INTRAVENOUS | Status: AC
  Filled 2016-10-28: qty 50

## 2016-10-28 MED ORDER — INFLUENZA VAC SPLIT QUAD 0.5 ML IM SUSY
0.5000 mL | PREFILLED_SYRINGE | INTRAMUSCULAR | Status: AC
  Administered 2016-10-29: 0.5 mL via INTRAMUSCULAR
  Filled 2016-10-28: qty 0.5

## 2016-10-28 MED ORDER — LEVOFLOXACIN IN D5W 750 MG/150ML IV SOLN
750.0000 mg | INTRAVENOUS | Status: AC
  Administered 2016-10-29 – 2016-11-04 (×7): 750 mg via INTRAVENOUS
  Filled 2016-10-28 (×11): qty 150

## 2016-10-28 MED ORDER — VASOPRESSIN 20 UNIT/ML IV SOLN
0.0300 [IU]/min | INTRAVENOUS | Status: DC
  Administered 2016-10-28 – 2016-10-29 (×2): 0.03 [IU]/min via INTRAVENOUS
  Filled 2016-10-28 (×2): qty 2

## 2016-10-28 MED ORDER — CHLORHEXIDINE GLUCONATE 0.12% ORAL RINSE (MEDLINE KIT)
15.0000 mL | Freq: Two times a day (BID) | OROMUCOSAL | Status: DC
  Administered 2016-10-28 – 2016-11-08 (×20): 15 mL via OROMUCOSAL

## 2016-10-28 MED ORDER — PNEUMOCOCCAL VAC POLYVALENT 25 MCG/0.5ML IJ INJ
0.5000 mL | INJECTION | INTRAMUSCULAR | Status: AC
  Administered 2016-10-29: 0.5 mL via INTRAMUSCULAR
  Filled 2016-10-28: qty 0.5

## 2016-10-28 MED ORDER — SODIUM CHLORIDE 0.9 % IV BOLUS (SEPSIS)
500.0000 mL | Freq: Once | INTRAVENOUS | Status: AC
  Administered 2016-10-28: 500 mL via INTRAVENOUS

## 2016-10-28 MED ORDER — MIDAZOLAM HCL 2 MG/2ML IJ SOLN
2.0000 mg | INTRAMUSCULAR | Status: DC | PRN
  Administered 2016-10-28 – 2016-11-01 (×10): 2 mg via INTRAVENOUS
  Filled 2016-10-28 (×10): qty 2

## 2016-10-28 MED ORDER — MIDAZOLAM HCL 2 MG/2ML IJ SOLN: 2.0000 mg | INTRAMUSCULAR | Status: DC | PRN

## 2016-10-28 MED ORDER — SODIUM CHLORIDE 0.9 % IV BOLUS (SEPSIS)
1000.0000 mL | Freq: Once | INTRAVENOUS | Status: AC
  Administered 2016-10-28: 1000 mL via INTRAVENOUS

## 2016-10-28 MED ORDER — MUPIROCIN 2 % EX OINT
1.0000 "application " | TOPICAL_OINTMENT | Freq: Two times a day (BID) | CUTANEOUS | Status: AC
  Administered 2016-10-28 – 2016-11-02 (×10): 1 via NASAL
  Filled 2016-10-28 (×3): qty 22

## 2016-10-28 MED ORDER — SODIUM CHLORIDE 0.9 % IV SOLN: INTRAVENOUS | Status: AC

## 2016-10-28 MED ORDER — MIDAZOLAM HCL 2 MG/2ML IJ SOLN
2.0000 mg | Freq: Once | INTRAMUSCULAR | Status: AC
  Administered 2016-10-28: 2 mg via INTRAVENOUS
  Filled 2016-10-28: qty 2

## 2016-10-28 MED ORDER — ORAL CARE MOUTH RINSE
15.0000 mL | OROMUCOSAL | Status: DC
  Administered 2016-10-28 – 2016-10-29 (×10): 15 mL via OROMUCOSAL

## 2016-10-28 MED ORDER — KETOROLAC TROMETHAMINE 30 MG/ML IJ SOLN
30.0000 mg | Freq: Once | INTRAMUSCULAR | Status: AC
  Administered 2016-10-28: 30 mg via INTRAVENOUS
  Filled 2016-10-28: qty 1

## 2016-10-28 MED ORDER — FENTANYL BOLUS VIA INFUSION
50.0000 ug | INTRAVENOUS | Status: DC | PRN
  Administered 2016-10-29 – 2016-11-01 (×7): 50 ug via INTRAVENOUS
  Filled 2016-10-28: qty 50

## 2016-10-28 MED ORDER — SODIUM BICARBONATE 8.4 % IV SOLN
INTRAVENOUS | Status: DC
  Administered 2016-10-28 – 2016-10-29 (×3): via INTRAVENOUS
  Filled 2016-10-28 (×5): qty 150

## 2016-10-28 MED ORDER — SODIUM CHLORIDE 0.9 % IV SOLN
1.0000 mg/h | INTRAVENOUS | Status: DC
  Administered 2016-10-28: 1 mg/h via INTRAVENOUS
  Filled 2016-10-28: qty 10

## 2016-10-28 MED ORDER — HYDROCORTISONE NA SUCCINATE PF 100 MG IJ SOLR
50.0000 mg | Freq: Four times a day (QID) | INTRAMUSCULAR | Status: DC
  Administered 2016-10-28 – 2016-10-30 (×8): 50 mg via INTRAVENOUS
  Filled 2016-10-28 (×9): qty 2

## 2016-10-28 NOTE — ED Notes (Signed)
Pt awake, family at bedside talking to pt.

## 2016-10-28 NOTE — Progress Notes (Signed)
[facility] Progress Note Patient Name: ARUL BARTHOL DOB: 01/08/1976 MRN: [id number]   Date of Service  10/28/2016  HPI/Events of Note  Head CT negative Chest CT with atx/consolidation and partially loculated effusion.  eICU Interventions  Will need chest tube tomorrow likely Start DVT proph     Intervention Category Intermediate Interventions: Other:  Mauri Brooklyn, Mamie Nick 10/28/2016, 6:42 PM

## 2016-10-28 NOTE — Progress Notes (Signed)
Pharmacy Antibiotic Note  Eric Hayes is a 40 y.o. male admitted on 10/27/2016 with pneumonia.  Pharmacy has been consulted for aztreonam and vancomycin dosing.  Patient received vancomycin 1g, levaquin 750mg , and aztreonam 1g IV once.   Plan: Vancomycin 1g IV every 12 hours.  Goal trough 15-20 mcg/mL.  Aztreonam 1g IV q8h Levaquin 750mg  IV q24h Monitor culture data, renal function and clinical course VT at SS prn  Height: 6\' 3"  (190.5 cm) Weight: 285 lb 4.4 oz (129.4 kg) IBW/kg (Calculated) : 84.5  Temp (24hrs), Avg:102.1 F (38.9 C), Min:100.2 F (37.9 C), Max:104.9 F (40.5 C)   Recent Labs Lab 10/27/16 2300 10/27/16 2301 10/28/16 0217 10/28/16 0551 10/28/16 0615  WBC 33.0*  --   --   --  28.4*  CREATININE 2.23* 2.10*  --   --  2.20*  LATICACIDVEN  --  3.99* 2.67* 2.5*  --     Estimated Creatinine Clearance: 64.7 mL/min (by C-G formula based on SCr of 2.2 mg/dL (H)).    Allergies  Allergen Reactions  . Penicillins Hives and Swelling    AS A CHILD    Antimicrobials this admission: Azactam 11/26 >>  Levaquin 11/26 >>  Vanc 11/26 >>  Dose adjustments this admission: N/a  Microbiology results: 11/25 BCx: ngtd 11/25 UCx: sent (cath)   Sputum:   11/26 MRSA PCR: sent   [name]. Diona Foley, PharmD, [place] Clinical Pharmacist Pager [phone] 10/28/2016 10:21 AM

## 2016-10-28 NOTE — H&P (Signed)
PULMONARY / CRITICAL CARE MEDICINE   Name: ISIAIH SCHWEIKART MRN: [id number] DOB: 1976-10-11    ADMISSION DATE:  10/27/2016  REFERRING MD:  Forestine Na   CHIEF COMPLAINT:  Respiratory failure, hypotension   HISTORY OF PRESENT ILLNESS:   40yo male with hx bipolar disorder, chronic pain, DM, previous hx DVT no longer on anticoagulation, HTN, OSA on CPAP, medication non-compliance presented 11/26 to [facility] with several days of fever, diarrhea and progressive dyspnea.  Reportedly had a fall several days prior to admit where he struck his chest and was wearing ace-wrap on his chest on presentation.  In ER he had progressively worsening WOB, hypoxia, hypotension and progressive AMS.  He required intubation shortly after arrival to ER and started on vasopressors.  Further w/u revealed AKI with Scr 2.2, lactate 4, nonAG metabolic acidosis.  He was tx to [facility] for further treatment.   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  No known sick contacts, no recent travel, no hemoptysis or chest pain.   PAST MEDICAL HISTORY :  He  has a past medical history of Anxiety; Back pain, chronic; Bipolar disorder ([place]); Depression; Diet-controlled diabetes mellitus ([place]); History of DVT (deep vein thrombosis) (07/2010); History of MRSA infection (2013); Hyperlipidemia; Hypertension; Osteoarthritis; and Sleep apnea.  PAST SURGICAL HISTORY: He  has a past surgical history that includes Appendectomy; Vasectomy; Wisdom tooth extraction; Debridement  foot (Right, 04/08/2002; 04/10/2002); Turbinate reduction (Bilateral, 03/16/2014); Sinus endo w/fusion (Bilateral, 03/16/2014); and Adenoidectomy (Bilateral, 03/16/2014).  Allergies  Allergen Reactions  . Penicillins Hives and Swelling    AS A CHILD    No current facility-administered medications on file prior to encounter.    Current Outpatient Prescriptions on File Prior to Encounter  Medication Sig  . acetaminophen (TYLENOL) 500 MG  tablet Take 1,500 mg by mouth every 6 (six) hours as needed for moderate pain. Reported on 12/20/2015  . allopurinol (ZYLOPRIM) 100 MG tablet TAKE 1 TABLET(100 MG) BY MOUTH DAILY  . alprazolam (XANAX) 2 MG tablet Take 2 mg by mouth 4 (four) times daily as needed for anxiety.   Marland Kitchen amphetamine-dextroamphetamine (ADDERALL) 20 MG tablet Take 20 mg by mouth daily.  . Azelastine-Fluticasone (DYMISTA) 137-50 MCG/ACT SUSP Place 2 sprays into the nose 2 (two) times daily. (Patient not taking: Reported on 12/20/2015)  . diclofenac (VOLTAREN) 75 MG EC tablet Take 1 tablet (75 mg total) by mouth 2 (two) times daily.  . furosemide (LASIX) 20 MG tablet TAKE 1 TABLET(20 MG) BY MOUTH DAILY    FAMILY HISTORY:  His indicated that the status of his father is unknown. He indicated that the status of his maternal grandmother is unknown. He indicated that the status of his maternal uncle is unknown.    SOCIAL HISTORY: He  reports that he has been smoking Cigarettes.  He has a 15.00 pack-year smoking history. He has never used smokeless tobacco. He reports that he does not drink alcohol or use drugs.  REVIEW OF SYSTEMS:   As per HPI obtained from wife and records.   SUBJECTIVE:    VITAL SIGNS: BP (!) 107/44   Pulse (!) 40   Temp (!) 101.1 F (38.4 C)   Resp (!) 23   Ht 6\' 3"  (1.905 m)   Wt 129.4 kg (285 lb 4.4 oz)   SpO2 100%   BMI 35.66 kg/m   HEMODYNAMICS: CVP:  [18 mmHg] 18 mmHg  VENTILATOR SETTINGS: Vent Mode: PRVC FiO2 (%):  [70 %-100 %]  70 % Set Rate:  [20 bmp-26 bmp] 22 bmp Vt Set:  [530 mL] 530 mL PEEP:  [5 cmH20-7 cmH20] 5 cmH20 Plateau Pressure:  [21 cmH20-25 cmH20] 24 cmH20  INTAKE / OUTPUT: I/O last 3 completed shifts: In: 7486.1 [I.V.:3586.1; IV Piggyback:3900] Out: 140 [Urine:140]  PHYSICAL EXAMINATION: General:  Young male, NAD on vent  Neuro:  Sedated, RASS -2 on fent gtt  HEENT:  Mm moist, ETT  Cardiovascular:  s1s2 rrr, frequent PVC's  Lungs:  resps even non labored on  vent, coarse  Abdomen:  Round, soft, hypoactive bs  Musculoskeletal:  Warm and dry, no sig edema   LABS:  BMET  Recent Labs Lab 10/27/16 2300 10/27/16 2301 10/28/16 0615  NA 132* 135 135  K 3.8 4.1 4.7  CL 97* 98* 106  CO2 23  --  18*  BUN 21* 22* 22*  CREATININE 2.23* 2.10* 2.20*  GLUCOSE 120* 120* 144*    Electrolytes  Recent Labs Lab 10/27/16 2300 10/28/16 0615  CALCIUM 8.3* 7.1*    CBC  Recent Labs Lab 10/27/16 2300 10/27/16 2301 10/28/16 0615  WBC 33.0*  --  28.4*  HGB 13.8 15.3 12.4*  HCT 42.1 45.0 39.2  PLT 273  --  260    Coag's No results for input(s): APTT, INR in the last 168 hours.  Sepsis Markers  Recent Labs Lab 10/27/16 2301 10/28/16 0217 10/28/16 0551  LATICACIDVEN 3.99* 2.67* 2.5*    ABG  Recent Labs Lab 10/27/16 2355 10/28/16 0105 10/28/16 0637  PHART 7.203* 7.218* 7.261*  PCO2ART 59.6* 49.7* 38.0  PO2ART 86.0 112.0* 98.5    Liver Enzymes  Recent Labs Lab 10/27/16 2300 10/28/16 0615  AST 30 32  ALT 16* 18  ALKPHOS 65 55  BILITOT 0.5 1.1  ALBUMIN 2.9* 2.1*    Cardiac Enzymes No results for input(s): TROPONINI, PROBNP in the last 168 hours.  Glucose No results for input(s): GLUCAP in the last 168 hours.  Imaging Dg Chest Portable 1 View  Result Date: 10/27/2016 CLINICAL DATA:  Patient status post intubation. EXAM: PORTABLE CHEST 1 VIEW COMPARISON:  Chest CT 06/10/2014; chest radiograph 06/09/2014. FINDINGS: ET tube terminates in the distal trachea. Enteric tube courses inferior to the diaphragm. Monitoring leads overlie the patient. Lateral right hemi thorax is excluded from view. Diffuse bilateral heterogeneous pulmonary opacities. No pleural effusion or pneumothorax. IMPRESSION: Diffuse bilateral heterogeneous pulmonary opacities may represent pulmonary edema in the appropriate clinical setting. Infection not excluded. ET tube terminates in the distal trachea. Electronically Signed   By: Lovey Newcomer M.D.    On: 10/27/2016 23:47   Dg Chest Port 1v Same Day  Result Date: 10/28/2016 CLINICAL DATA:  40 y/o  M; central line placement. EXAM: PORTABLE CHEST 1 VIEW COMPARISON:  10/27/2016 chest radiograph. FINDINGS: Diffuse patchy opacification of lungs greater on the right may represent asymmetric pulmonary edema or multi focal pneumonia. Interval placement of a right central venous catheter with tip projecting over the right supraclavicular fossa. Endotracheal tube unchanged in position approximately 2-1/2 cm from carina. Enteric tube tip below the field of view in the abdomen. IMPRESSION: Right central venous catheter tip projects over the right supraclavicular fossa. Stable position of enteric and endotracheal tubes. Right greater than left lung patchy opacification may represent asymmetric edema or multi focal pneumonia. Electronically Signed   By: Kristine Garbe M.D.   On: 10/28/2016 00:04     STUDIES:  CT chest 11/26>>> 2D echo 11/26>>>  CULTURES: BC x 2 11/26>>>  Urine 11/26>>> Sputum 11/26>>> Stool 11/26>>>  ANTIBIOTICS: Vanc 11/26>>> Levaquin 11/26>>> Aztreonam 11/26>>>  SIGNIFICANT EVENTS:   LINES/TUBES: ETT 11/26>>> R IJ cortis (EDP) 11/26>>>  DISCUSSION: 40yo male with hx DM, HTN, OSA on cpap admitted 11/26 with acute hypoxic respiratory failure, sepsis.   ASSESSMENT / PLAN:  PULMONARY Acute hypoxic respiratory failure  Hx OSA on CPAP  Presumed CAP  Recent fall  P:   Vent support - 8cc/kg  F/u CXR  F/u ABG CT chest  Abx as above   CARDIOVASCULAR Shock - likely septic + hypovolemic in setting diarrhea. Although lactate reassuring. Has been volume resuscitated.  Hx HTN  P:  Hold home lasix  continue levophed and titrate as able to keep MAP >65 Trend troponin  Will check echo given persistent hypotension despite adequate volume resuscitation, marginal lactate but persistent hypotension Will also check BLE venous dopplers for completeness given hx DVT -  no CTA r/t AKI  May need to place CVL through cortis to eval CVP - will hold off for now F/u lactate, pct  Drug screen   RENAL AKI - unknown baseline Scr  nonAG metabolic acidosis  P:   Ensure lactate clearance  F/u chem  Gentle volume  Pharmacy to dose abx  Will add HCO3 gtt  F/u ABG   GASTROINTESTINAL Diarrhea  P:   NPO  pepcid  GI panel   HEMATOLOGIC Leukocytosis  P:  SQ heparin  F/u CBC   INFECTIOUS Sepsis  Presumed CAP  Diarrhea  Fever  P:   abx as above -- PCN allergic  Check HIV  Pan culture   ENDOCRINE DM    P:   SSI   NEUROLOGIC AMS - in setting sepsis, hypotension  P:   RASS goal: -1 Fentanyl gtt, PRN versed    FAMILY  - Updates:  Wife updated at bedside 11/26  - Inter-disciplinary family meet or Palliative Care meeting due by:  12/3   Nickolas Madrid, NP 10/28/2016  10:06 AM Pager: [phone] or [phone]  ATTENDING NOTE / ATTESTATION NOTE :   I have discussed the case with the resident/APP  Nickolas Madrid NP.   I agree with the resident/APP's  history, physical examination, assessment, and plans.    I have edited the above note and modified it according to our agreed history, physical examination, assessment and plan.   Briefly, patient with significant smoking history, likely with COPD based on chest CT scan done in August 2017, with diabetes controlled on diet, presents with 1 week history of diarrhea, feeling unwell, and 1-2 day history of shortness of breath and altered mental status. He presented to [facility] emergency room and was treated as septic shock and hypovolemic shock related to diarrhea. He was intubated for airway protection. He was started on broad-spectrum antibiotics. He was transferred to [facility] for further management.  Since being here, he has been on levo fed at 50 mcg/kg/h. MAP in the 70s. He has been adequately treated resuscitated and is 8 L positive.  Patient has recent exposure to children who  had viral illness with diarrhea and upper resp tract infection. Children are better.   Physical exam done today. Pt seen comfortable. BP 100/60 on levophed. HR 120, RR 20. Sat 90% on 70% Fio2. (-) NVD. Intubated. Sedated. Some rhonchi at the bases. Tachycardic. Belly was soft, nondistended, no masses. Extremities were warm and dry. No edema.  Labs reviewed. Patient with elevated white blood cell count. Lactic acid was 4,  better now at 2. Creatinine is 2.2. Bicarbonate 18. Initial troponin 0.79. Cultures negative so far. Chest x-ray with infiltrate on the right middle to lower lung zones. ABG pH 7.26, 38, 99 on 70% FiO2.  Assessment : 1. Acute hypoxemic hypercapnic respiratory failure secondary to sepsis / possible community-acquired pneumonia right middle to lower lung zone / Viral PNA RML, RLL / concerning for demand ischemia / COPD (undiagnosed) / Possible OSA 2. Septic shock likely secondary to community-acquired pneumonia, rule out occult infection. Component of hypovolemic shock related to diarrhea and poor by mouth intake. 3. AKI  2/2 to sepsis, hypovolemia. 4. Recent fall, trauma, less likely CNS injury but he was altered when he came to ED 5. Pt with history of unprovoked DVT several years ago. Not on [facility]   Plan : 1. Cont ventilatory support. Requiring a lot of FiO2. Hopefully we can cut down FiO2. Keep sats more than 88%. 2. Continue broad spectrum antibiotics. Panculture. Hopefully, we can deescalate in 1-2 days.  3. Cont levophed. Add vasopressin. Add steroids (shock) 4. Hold off on TF today. Start in am.  5. Check chest ct scan (recent fall, ? Blood), check cranial ct scan as well.  6. Trend troponin, cehck echo.  7.  Neb meds.  8. F/u lactate.  9. DVT prophylaxis with heparin if ct head is (-).    I have spent 30  minutes of critical care time with this patient today.  Family :Family updated at length today.  Updated wife.   Monica Becton, MD 10/28/2016, 11:43  AM [facility] Pager [phone] After 3 pm or if no answer, call [phone]

## 2016-10-28 NOTE — Progress Notes (Signed)
Patient transported to CT and back to room [id number] without complications.  Also changed ETT holder upon arrival, with assistance of RN without complications.

## 2016-10-28 NOTE — ED Notes (Signed)
Pt given 500 ml bolus & propofol stopped. Pt given 2mg  versed. Dr Christy Gentles made aware.

## 2016-10-28 NOTE — ED Notes (Signed)
Per family pt fell a few days ago. Pt had a tight ace wrap around chest.
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
## 2016-10-28 NOTE — ED Notes (Signed)
CRITICAL VALUE ALERT  Critical value received:  Lactic acid 2.67  Date of notification:  10/28/16  Time of notification:  0222  Critical value read back:Yes.    Nurse who received alert:  Derek Mound, RN  MD notified (1st page):  [place]  Time of first page:  0223  MD notified (2nd page):  Time of second page:  Responding MD:  wickline   Time MD responded:  [phone]

## 2016-10-28 NOTE — Procedures (Signed)
Arterial Catheter Insertion Procedure Note [facility] [id number] 1976-04-06  Procedure: Insertion of Arterial Catheter  Indications: Blood pressure monitoring and Frequent blood sampling  Procedure Details Consent: Risks of procedure as well as the alternatives and risks of each were explained to the (patient/caregiver).  Consent for procedure obtained. Time Out: Verified patient identification, verified procedure, site/side was marked, verified correct patient position, special equipment/implants available, medications/allergies/relevent history reviewed, required imaging and test results available.  Performed  Maximum sterile technique was used including antiseptics, cap, gloves, gown, hand hygiene, mask and sheet. Skin prep: Chlorhexidine; local anesthetic administered 20 gauge catheter was inserted into left radial artery using the Seldinger technique.  Evaluation Blood flow good; BP tracing good. Complications: No apparent complications.   Philomena Doheny 10/28/2016

## 2016-10-28 NOTE — Progress Notes (Signed)
Sputum sample obtained and sent down to main lab without complications.  

## 2016-10-29 ENCOUNTER — Encounter (HOSPITAL_COMMUNITY): Payer: Self-pay | Admitting: Physician Assistant

## 2016-10-29 ENCOUNTER — Inpatient Hospital Stay (HOSPITAL_COMMUNITY): Payer: Medicare Other

## 2016-10-29 DIAGNOSIS — J90 Pleural effusion, not elsewhere classified: Secondary | ICD-10-CM

## 2016-10-29 DIAGNOSIS — R778 Other specified abnormalities of plasma proteins: Secondary | ICD-10-CM

## 2016-10-29 DIAGNOSIS — Z01810 Encounter for preprocedural cardiovascular examination: Secondary | ICD-10-CM

## 2016-10-29 DIAGNOSIS — R748 Abnormal levels of other serum enzymes: Secondary | ICD-10-CM

## 2016-10-29 DIAGNOSIS — R0902 Hypoxemia: Secondary | ICD-10-CM

## 2016-10-29 DIAGNOSIS — R7989 Other specified abnormal findings of blood chemistry: Secondary | ICD-10-CM

## 2016-10-29 DIAGNOSIS — Z0000 Encounter for general adult medical examination without abnormal findings: Secondary | ICD-10-CM

## 2016-10-29 LAB — BLOOD CULTURE ID PANEL (REFLEXED)
Acinetobacter baumannii: NOT DETECTED
CANDIDA GLABRATA: NOT DETECTED
CANDIDA PARAPSILOSIS: NOT DETECTED
CANDIDA TROPICALIS: NOT DETECTED
Candida albicans: NOT DETECTED
Candida krusei: NOT DETECTED
ENTEROBACTER CLOACAE COMPLEX: NOT DETECTED
Enterobacteriaceae species: NOT DETECTED
Enterococcus species: NOT DETECTED
Escherichia coli: NOT DETECTED
HAEMOPHILUS INFLUENZAE: NOT DETECTED
KLEBSIELLA PNEUMONIAE: NOT DETECTED
Klebsiella oxytoca: NOT DETECTED
Listeria monocytogenes: NOT DETECTED
Methicillin resistance: DETECTED — AB
NEISSERIA MENINGITIDIS: NOT DETECTED
PROTEUS SPECIES: NOT DETECTED
Pseudomonas aeruginosa: NOT DETECTED
STAPHYLOCOCCUS SPECIES: DETECTED — AB
STREPTOCOCCUS SPECIES: NOT DETECTED
Serratia marcescens: NOT DETECTED
Staphylococcus aureus (BCID): NOT DETECTED
Streptococcus agalactiae: NOT DETECTED
Streptococcus pneumoniae: NOT DETECTED
Streptococcus pyogenes: NOT DETECTED

## 2016-10-29 LAB — BLOOD GAS, ARTERIAL
Acid-base deficit: 0.8 mmol/L (ref 0.0–2.0)
Bicarbonate: 23.6 mmol/L (ref 20.0–28.0)
DRAWN BY: 36496
FIO2: 50
MECHVT: 530 mL
O2 SAT: 91.5 %
PATIENT TEMPERATURE: 97.9
PCO2 ART: 39.4 mmHg (ref 32.0–48.0)
PEEP: 5 cmH2O
PH ART: 7.392 (ref 7.350–7.450)
RATE: 22 resp/min
pO2, Arterial: 59.3 mmHg — ABNORMAL LOW (ref 83.0–108.0)

## 2016-10-29 LAB — GLUCOSE, CAPILLARY
GLUCOSE-CAPILLARY: 102 mg/dL — AB (ref 65–99)
GLUCOSE-CAPILLARY: 136 mg/dL — AB (ref 65–99)
GLUCOSE-CAPILLARY: 209 mg/dL — AB (ref 65–99)
Glucose-Capillary: 191 mg/dL — ABNORMAL HIGH (ref 65–99)
Glucose-Capillary: 131 mg/dL — ABNORMAL HIGH (ref 65–99)
Glucose-Capillary: 84 mg/dL (ref 65–99)
Glucose-Capillary: 86 mg/dL (ref 65–99)

## 2016-10-29 LAB — BASIC METABOLIC PANEL
Anion gap: 10 (ref 5–15)
BUN: 26 mg/dL — AB (ref 6–20)
CHLORIDE: 102 mmol/L (ref 101–111)
CO2: 23 mmol/L (ref 22–32)
Calcium: 7.6 mg/dL — ABNORMAL LOW (ref 8.9–10.3)
Creatinine, Ser: 1.09 mg/dL (ref 0.61–1.24)
GFR calc Af Amer: >60 mL/min (ref >60)
GFR calc non Af Amer: >60 mL/min (ref >60)
GLUCOSE: 209 mg/dL — AB (ref 65–99)
POTASSIUM: 4.3 mmol/L (ref 3.5–5.1)
Sodium: 135 mmol/L (ref 135–145)

## 2016-10-29 LAB — PHOSPHORUS: PHOSPHORUS: 2.4 mg/dL — AB (ref 2.5–4.6)

## 2016-10-29 LAB — CBC
HCT: 34.9 % — ABNORMAL LOW (ref 39.0–52.0)
HEMOGLOBIN: 11.2 g/dL — AB (ref 13.0–17.0)
MCH: 29.2 pg (ref 26.0–34.0)
MCHC: 32.1 g/dL (ref 30.0–36.0)
MCV: 91.1 fL (ref 78.0–100.0)
Platelets: 235 10*3/uL (ref 150–400)
RBC: 3.83 MIL/uL — AB (ref 4.22–5.81)
RDW: 14.1 % (ref 11.5–15.5)
WBC: 26.1 10*3/uL — ABNORMAL HIGH (ref 4.0–10.5)

## 2016-10-29 LAB — TROPONIN I
TROPONIN I: 0.61 ng/mL — AB (ref <0.03)
TROPONIN I: 0.24 ng/mL — AB (ref <0.03)
Troponin I: 0.23 ng/mL (ref <0.03)

## 2016-10-29 LAB — URINE CULTURE: CULTURE: NO GROWTH

## 2016-10-29 LAB — HIV ANTIBODY (ROUTINE TESTING W REFLEX): HIV SCREEN 4TH GENERATION: NONREACTIVE

## 2016-10-29 LAB — MAGNESIUM: MAGNESIUM: 1.3 mg/dL — AB (ref 1.7–2.4)

## 2016-10-29 LAB — PROCALCITONIN: Procalcitonin: 44.02 ng/mL

## 2016-10-29 LAB — HEPARIN LEVEL (UNFRACTIONATED)

## 2016-10-29 MED ORDER — SODIUM CHLORIDE 0.9 % IV SOLN
INTRAVENOUS | Status: DC
  Administered 2016-10-29: 20:00:00 via INTRAVENOUS
  Administered 2016-10-30: 10 mL/h via INTRAVENOUS
  Administered 2016-10-30 – 2016-11-04 (×2): via INTRAVENOUS

## 2016-10-29 MED ORDER — HEPARIN BOLUS VIA INFUSION
3000.0000 [IU] | Freq: Once | INTRAVENOUS | Status: AC
  Administered 2016-10-29: 3000 [IU] via INTRAVENOUS
  Filled 2016-10-29: qty 3000

## 2016-10-29 MED ORDER — ORAL CARE MOUTH RINSE
15.0000 mL | Freq: Four times a day (QID) | OROMUCOSAL | Status: DC
Start: 2016-10-30 — End: 2016-11-08
  Administered 2016-10-29 – 2016-11-08 (×28): 15 mL via OROMUCOSAL

## 2016-10-29 MED ORDER — CHLORHEXIDINE GLUCONATE CLOTH 2 % EX PADS
6.0000 | MEDICATED_PAD | Freq: Every day | CUTANEOUS | Status: AC
  Administered 2016-10-30 – 2016-11-02 (×4): 6 via TOPICAL

## 2016-10-29 MED ORDER — MAGNESIUM SULFATE 2 GM/50ML IV SOLN
2.0000 g | Freq: Once | INTRAVENOUS | Status: AC
  Administered 2016-10-29: 2 g via INTRAVENOUS
  Filled 2016-10-29: qty 50

## 2016-10-29 MED ORDER — HEPARIN (PORCINE) IN NACL 100-0.45 UNIT/ML-% IJ SOLN
1700.0000 [IU]/h | INTRAMUSCULAR | Status: DC
  Administered 2016-10-29: 1700 [IU]/h via INTRAVENOUS
  Administered 2016-10-29: 1400 [IU]/h via INTRAVENOUS
  Filled 2016-10-29 (×3): qty 250

## 2016-10-29 NOTE — Care Management Note (Signed)
Case Management Note  Patient Details  Name: Eric Hayes MRN: [id number] Date of Birth: 01-Mar-1976  Subjective/Objective:   Pt admitted on 10/27/16 with acute hypoxic respiratory failure and sepsis.  PTA, pt independent, lives with spouse.                   Action/Plan: Met with pt's wife at bedside; states pt independent prior to admission, but has been disabled for 6 years.   Will follow for discharge planning as pt progresses.  Wife able to provide care at dc.    Expected Discharge Date:                  Expected Discharge Plan:  [place]  In-House Referral:     Discharge planning Services  CM Consult  Post Acute Care Choice:    Choice offered to:     DME Arranged:    DME Agency:     HH Arranged:    [place] Agency:     Status of Service:  In process, will continue to follow  If discussed at Long Length of Stay Meetings, dates discussed:    Additional Comments:  Reinaldo Raddle, RN, BSN  Trauma/Neuro ICU Case Manager [phone]

## 2016-10-29 NOTE — Progress Notes (Addendum)
[facility] Progress Note Patient Name: Eric Hayes DOB: Dec 28, 1975 MRN: [id number]   Date of Service  10/29/2016  HPI/Events of Note  Trop elevated  eICU Interventions  Hep drip, start for acs Last ecg, no st changes Trop follow up Ct head neg     Intervention Category Intermediate Interventions: Diagnostic test evaluation  Raylene Miyamoto. 10/29/2016, 1:29 AM

## 2016-10-29 NOTE — Progress Notes (Signed)
ANTICOAGULATION CONSULT NOTE - Initial Consult  Pharmacy Consult for Heparin  Indication: chest pain/ACS  Allergies  Allergen Reactions  . Penicillins Hives and Swelling    AS A CHILD    Patient Measurements: Height: 6\' 3"  (190.5 cm) Weight: 285 lb 4.4 oz (129.4 kg) IBW/kg (Calculated) : 84.5  Vital Signs: Temp: 99.3 F (37.4 C) (11/27 1000) BP: 103/55 (11/27 1000) Pulse Rate: 107 (11/27 1000)  Labs:  Recent Labs  10/27/16 2300 10/27/16 2301 10/28/16 0615  10/28/16 2147 10/29/16 0129 10/29/16 0535 10/29/16 0800 10/29/16 0935  HGB 13.8 15.3 12.4*  --   --   --  11.2*  --   --   HCT 42.1 45.0 39.2  --   --   --  34.9*  --   --   PLT 273  --  260  --   --   --  235  --   --   HEPARINUNFRC  --   --   --   --   --   --   --   --  <0.10*  CREATININE 2.23* 2.10* 2.20*  --   --   --  1.09  --   --   TROPONINI  --   --   --   < > 2.87* 0.61*  --  0.23*  --   < > = values in this interval not displayed.  Estimated Creatinine Clearance: 130.6 mL/min (by C-G formula based on SCr of 1.09 mg/dL).  Assessment: 40 yo m presenting with resp failure, hypotension  PMH: bipolar, DM, HTN, OSA, previous DVT s/p AC  Anticoag: none pta - IV hep for r/p ACS with rising trop.  Initial lvl < 0.1 on 1400 units/hr  CV: trop pk 2.87, now 0.23  Nephro: sCr 1.09  Heme/Onc: H&H 11.2/34.9, Plt 235  Goal of Therapy:  Heparin level 0.3-0.7 units/ml Monitor platelets by anticoagulation protocol: Yes   Plan:  Heparin bolus 3000 units x 1 Increase gtt to 1700 units/hr Next HL 1800 Daily HL, CBC F/U Cards consult/LOT for hep  Levester Fresh, PharmD, BCPS, [facility] Clinical Pharmacist Phone [phone] 10/29/2016 10:36 AM

## 2016-10-29 NOTE — Progress Notes (Signed)
PHARMACY - PHYSICIAN COMMUNICATION CRITICAL VALUE ALERT - BLOOD CULTURE IDENTIFICATION (BCID)  Results for orders placed or performed during the hospital encounter of 10/27/16  Blood Culture ID Panel (Reflexed) (Collected: 10/27/2016 11:22 PM)  Result Value Ref Range   Enterococcus species NOT DETECTED NOT DETECTED   Listeria monocytogenes NOT DETECTED NOT DETECTED   Staphylococcus species DETECTED (A) NOT DETECTED   Staphylococcus aureus NOT DETECTED NOT DETECTED   Methicillin resistance DETECTED (A) NOT DETECTED   Streptococcus species NOT DETECTED NOT DETECTED   Streptococcus agalactiae NOT DETECTED NOT DETECTED   Streptococcus pneumoniae NOT DETECTED NOT DETECTED   Streptococcus pyogenes NOT DETECTED NOT DETECTED   Acinetobacter baumannii NOT DETECTED NOT DETECTED   Enterobacteriaceae species NOT DETECTED NOT DETECTED   Enterobacter cloacae complex NOT DETECTED NOT DETECTED   Escherichia coli NOT DETECTED NOT DETECTED   Klebsiella oxytoca NOT DETECTED NOT DETECTED   Klebsiella pneumoniae NOT DETECTED NOT DETECTED   Proteus species NOT DETECTED NOT DETECTED   Serratia marcescens NOT DETECTED NOT DETECTED   Haemophilus influenzae NOT DETECTED NOT DETECTED   Neisseria meningitidis NOT DETECTED NOT DETECTED   Pseudomonas aeruginosa NOT DETECTED NOT DETECTED   Candida albicans NOT DETECTED NOT DETECTED   Candida glabrata NOT DETECTED NOT DETECTED   Candida krusei NOT DETECTED NOT DETECTED   Candida parapsilosis NOT DETECTED NOT DETECTED   Candida tropicalis NOT DETECTED NOT DETECTED    Name of physician (or Provider) Contacted: None  Changes to prescribed antibiotics required: Patient already on vancomycin, aztreonam, and levaquin - day #2.  Deboraha Sprang 10/29/2016  8:10 AM

## 2016-10-29 NOTE — Progress Notes (Signed)
ANTICOAGULATION CONSULT NOTE - Initial Consult  Pharmacy Consult for Heparin  Indication: chest pain/ACS  Allergies  Allergen Reactions  . Penicillins Hives and Swelling    AS A CHILD    Patient Measurements: Height: 6\' 3"  (190.5 cm) Weight: 285 lb 4.4 oz (129.4 kg) IBW/kg (Calculated) : 84.5  Vital Signs: Temp: 100.4 F (38 C) (11/27 0000) Temp Source: Rectal (11/26 2000) BP: 99/61 (11/27 0111) Pulse Rate: 103 (11/27 0111)  Labs:  Recent Labs  10/27/16 2300 10/27/16 2301 10/28/16 0615 10/28/16 0947 10/28/16 1532 10/28/16 2147  HGB 13.8 15.3 12.4*  --   --   --   HCT 42.1 45.0 39.2  --   --   --   PLT 273  --  260  --   --   --   CREATININE 2.23* 2.10* 2.20*  --   --   --   TROPONINI  --   --   --  0.79* 0.68* 2.87*    Estimated Creatinine Clearance: 64.7 mL/min (by C-G formula based on SCr of 2.2 mg/dL (H)).   Medical History: Past Medical History:  Diagnosis Date  . Anxiety   . Back pain, chronic    mid- and lower back  . Bipolar disorder ([place])   . Depression   . Diet-controlled diabetes mellitus ([place])   . History of DVT (deep vein thrombosis) 07/2010   right leg  . History of MRSA infection 2013   thigh  . Hyperlipidemia    no current med.  . Hypertension    no current med.  . Osteoarthritis   . Sleep apnea    uses CPAP nightly    Assessment: Heparin for rising troponin, pt here with resp failure/sepsis, CBC ok, noted renal dysfunction  Goal of Therapy:  Heparin level 0.3-0.7 units/ml Monitor platelets by anticoagulation protocol: Yes   Plan:  -DC subcutaneous heparin (last given ~4 hours ago) -Heparin 3000 units BOLUS -Start heparin drip at 1400 units/hr -1000 HL -Daily CBC/HL -Monitor for bleeding  Narda Bonds 10/29/2016,1:31 AM

## 2016-10-29 NOTE — Consult Note (Signed)
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 mL/min   GFR calc Af Amer >60 >60 mL/min    Comment: (NOTE) The eGFR has been calculated using the CKD EPI equation. This calculation has not been validated in all clinical situations. eGFR's persistently <60 mL/min signify possible Chronic Kidney Disease.    Anion gap 10 5 - 15  Magnesium     Status: Abnormal   Collection Time: 10/29/16  5:35 AM  Result Value Ref Range   Magnesium 1.3 (L) 1.7 - 2.4 mg/dL  Phosphorus     Status: Abnormal   Collection Time: 10/29/16  5:35 AM  Result Value Ref Range   Phosphorus 2.4 (L) 2.5 - 4.6 mg/dL  Troponin I     Status: Abnormal   Collection Time:  10/29/16  8:00 AM  Result Value Ref Range   Troponin I 0.23 (HH) <0.03 ng/mL    Comment: CRITICAL VALUE NOTED.  VALUE IS CONSISTENT WITH PREVIOUSLY REPORTED AND CALLED VALUE.  Glucose, capillary     Status: Abnormal   Collection Time: 10/29/16  8:50 AM  Result Value Ref Range   Glucose-Capillary 191 (H) 65 - 99 mg/dL  Heparin level (unfractionated)     Status: Abnormal   Collection Time: 10/29/16  9:35 AM  Result Value Ref Range   Heparin Unfractionated <0.10 (L) 0.30 - 0.70 IU/mL    Comment:        IF HEPARIN RESULTS ARE BELOW EXPECTED VALUES, AND PATIENT DOSAGE HAS BEEN CONFIRMED, SUGGEST FOLLOW UP TESTING OF ANTITHROMBIN III LEVELS.   Glucose, capillary     Status: Abnormal   Collection Time: 10/29/16  1:00 PM  Result Value Ref Range   Glucose-Capillary 131 (H) 65 - 99 mg/dL  Troponin I     Status: Abnormal   Collection Time: 10/29/16  1:29 PM  Result Value Ref Range   Troponin I 0.24 (HH) <0.03 ng/mL    Comment: CRITICAL VALUE NOTED.  VALUE IS CONSISTENT WITH PREVIOUSLY REPORTED AND CALLED VALUE.  Glucose, capillary     Status: None   Collection Time: 10/29/16  3:52 PM  Result Value Ref Range   Glucose-Capillary 84 65 - 99 mg/dL    Ct Head Wo Contrast  Result  Date: 10/28/2016 CLINICAL DATA:  Altered mental status. EXAM: CT HEAD WITHOUT CONTRAST TECHNIQUE: Contiguous axial images were obtained from the base of the skull through the vertex without intravenous contrast. COMPARISON:  CT scan of March 27, 2011. FINDINGS: Brain: No evidence of acute infarction, hemorrhage, hydrocephalus, extra-axial collection or mass lesion/mass effect. The appearance of the brain is stable compared to prior exam of 2012. Vascular: No hyperdense vessel or unexpected calcification. Skull: Normal. Negative for fracture or focal lesion. Sinuses/Orbits: Bilateral frontal, ethmoid, sphenoid and maxillary sinusitis is noted. Other: None. IMPRESSION: No acute intracranial abnormality seen. Pansinusitis  is noted. This exam was interpreted in consultation with Dr. Lars Pinks, neuroradiologist. Electronically Signed   By: Marijo Conception, M.D.   On: 10/28/2016 15:11   Ct Chest Wo Contrast  Result Date: 10/28/2016 CLINICAL DATA:  Respiratory failure, fall. EXAM: CT CHEST WITHOUT CONTRAST TECHNIQUE: Multidetector CT imaging of the chest was performed following the standard protocol without IV contrast. COMPARISON:  Radiograph of October 27, 2016. CT scan of June 10, 2014. FINDINGS: Cardiovascular: There is no evidence of thoracic aortic aneurysm. Mediastinum/Nodes: Endotracheal tube is in grossly good position. Nasogastric tube is seen passing through esophagus into distal stomach. Stable mildly enlarged mediastinal adenopathy is noted which most likely is reactive in etiology. Lungs/Pleura: No pneumothorax is noted. Left posterior basilar airspace opacity is noted concerning for atelectasis or pneumonia. Large partially loculated right pleural effusion is noted with associated atelectasis of right lower lobe. Right upper lobe airspace opacity is noted concerning for atelectasis or pneumonia. Upper Abdomen: No acute abnormality. Musculoskeletal: No chest wall mass or suspicious bone lesions identified. IMPRESSION: Endotracheal and nasogastric tubes are in grossly good position. Left lower lobe pneumonia or atelectasis is noted. Large partially loculated right pleural effusion is noted with associated atelectasis of right lower lobe. Right upper lobe pneumonia or atelectasis is noted. Electronically Signed   By: Marijo Conception, M.D.   On: 10/28/2016 15:31   Portable Chest Xray  Result Date: 10/29/2016 CLINICAL DATA:  Hypoxia EXAM: PORTABLE CHEST 1 VIEW COMPARISON:  October 28, 2016 chest CT and chest radiograph October 27, 2016 FINDINGS: Endotracheal tube tip is 2.6 cm above carina. Central catheter tip is in the superior vena cava. Nasogastric tube tip and side port are below the diaphragm. No pneumothorax.  There is airspace consolidation throughout most of the right lung, progressed from 2 days prior. There is a moderate pleural effusion on the right. On the left, there is patchy atelectasis and consolidation medial left base, stable. Heart is upper normal in size. The pulmonary vascularity is within normal limits. No adenopathy evident. IMPRESSION: Tube and catheter positions as described without pneumothorax. Progression of consolidation and effusion on the right. Stable consolidation atelectasis left base. Stable cardiac silhouette. Electronically Signed   By: Lowella Grip III M.D.   On: 10/29/2016 07:18   Dg Chest Portable 1 View  Result Date: 10/27/2016 CLINICAL DATA:  Patient status post intubation. EXAM: PORTABLE CHEST 1 VIEW COMPARISON:  Chest CT 06/10/2014; chest radiograph 06/09/2014. FINDINGS: ET tube terminates in the distal trachea. Enteric tube courses inferior to the diaphragm. Monitoring leads overlie the patient. Lateral right hemi thorax is excluded from view. Diffuse bilateral heterogeneous pulmonary opacities. No pleural effusion or pneumothorax. IMPRESSION: Diffuse bilateral heterogeneous pulmonary opacities may represent pulmonary edema in the appropriate clinical setting. Infection not excluded. ET tube terminates in the distal trachea. Electronically Signed   By: Lovey Newcomer M.D.   On: 10/27/2016 23:47  Dg Chest Port 1v Same Day  Result Date: 10/28/2016 CLINICAL DATA:  40 y/o  M; central line placement. EXAM: PORTABLE CHEST 1 VIEW COMPARISON:  10/27/2016 chest radiograph. FINDINGS: Diffuse patchy opacification of lungs greater on the right may represent asymmetric pulmonary edema or multi focal pneumonia. Interval placement of a right central venous catheter with tip projecting over the right supraclavicular fossa. Endotracheal tube unchanged in position approximately 2-1/2 cm from carina. Enteric tube tip below the field of view in the abdomen. IMPRESSION: Right central venous  catheter tip projects over the right supraclavicular fossa. Stable position of enteric and endotracheal tubes. Right greater than left lung patchy opacification may represent asymmetric edema or multi focal pneumonia. Electronically Signed   By: Kristine Garbe M.D.   On: 10/28/2016 00:04    Review of Systems  Unable to perform ROS: Intubated   Blood pressure 123/82, pulse (!) 118, temperature (!) 100.9 F (38.3 C), resp. rate (!) 22, height '6\' 3"'  (1.905 m), weight 285 lb 4.4 oz (129.4 kg), SpO2 93 %. Physical Exam  Vitals reviewed. Constitutional: He appears well-developed and well-nourished. No distress.  Intubated but alert  HENT:  intubated  Eyes: Conjunctivae and EOM are normal. No scleral icterus.  Neck: Neck supple.  Cardiovascular: Normal rate, regular rhythm, normal heart sounds and intact distal pulses.   No murmur heard. Respiratory: Effort normal.  Absent BS right base, rhonchi bilaterally  GI: Soft. He exhibits no distension.  Musculoskeletal: He exhibits no edema.  Lymphadenopathy:    He has no cervical adenopathy.  Neurological: He is alert.  Moves all 4 extremities with good strength  Skin: Skin is warm and dry.    Assessment/Plan: 40 yo man with multiple medical issues who presented with sepsis with acute respiratory and renal failure. He required intubation and pressor support. He has responded well to IV antibiotics and has weaned from pressors and is weaning well from the vent.  He has a right pleural effusion, likely a para-pneumonic effusion. He will likely need a VATS at some point but as long as he is improving clinically and progressing with the vent wean there is no urgent indication for it at this point.  Discussed with Dr. Lamonte Sakai.   Will follow  Melrose Nakayama 10/29/2016, 5:12 PM
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
## 2016-10-29 NOTE — Progress Notes (Signed)
Patient transported from 3M01 to [id number] without complications.

## 2016-10-29 NOTE — Progress Notes (Signed)
*  PRELIMINARY RESULTS* Vascular Ultrasound Lower extremity venous duplex has been completed.  Preliminary findings: No evidence of DVT or baker's cyst.  Landry Mellow, RDMS, RVT  10/29/2016, 3:21 PM

## 2016-10-29 NOTE — Progress Notes (Signed)
PULMONARY / CRITICAL CARE MEDICINE   Name: Eric Hayes MRN: [id number] DOB: 1976/10/16    ADMISSION DATE:  10/27/2016  REFERRING MD:  Forestine Na   CHIEF COMPLAINT:  Respiratory failure, hypotension   HISTORY OF PRESENT ILLNESS:   40yo male with hx bipolar disorder, chronic pain, DM, previous hx DVT no longer on anticoagulation, HTN, OSA on CPAP, medication non-compliance presented 11/26 to [facility] with several days of fever, diarrhea and progressive dyspnea.  Reportedly had a fall several days prior to admit where he struck his chest and was wearing ace-wrap on his chest on presentation.  In ER he had progressively worsening WOB, hypoxia, hypotension and progressive AMS.  He required intubation shortly after arrival to ER and started on vasopressors.  Further w/u revealed AKI with Scr 2.2, lactate 4, nonAG metabolic acidosis.  He was tx to [facility] for further treatment.   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  No known sick contacts, no recent travel, no hemoptysis or chest pain.   SUBJECTIVE:  Note heparin gtt started last night given NSTEMI Pt currently in [place] Pressors weaning to off CT chest done 11/26 > multilobar PNA, R effusion, ? Partially loculated.  VITAL SIGNS: BP 114/81   Pulse (!) 38   Temp 99.5 F (37.5 C)   Resp (!) 23   Ht 6\' 3"  (1.905 m)   Wt 129.4 kg (285 lb 4.4 oz)   SpO2 100%   BMI 35.66 kg/m   HEMODYNAMICS:    VENTILATOR SETTINGS: Vent Mode: PSV;CPAP FiO2 (%):  [45 %-70 %] 45 % Set Rate:  [22 bmp] 22 bmp Vt Set:  [530 mL] 530 mL PEEP:  [5 cmH20] 5 cmH20 Pressure Support:  [5 cmH20] 5 cmH20 Plateau Pressure:  [12 cmH20-19 cmH20] 19 cmH20  INTAKE / OUTPUT: I/O last 3 completed shifts: In: 11796.9 [I.V.:7546.9; IV Piggyback:4250] Out: 1700 [Urine:1700]  PHYSICAL EXAMINATION: General:  Young male, NAD on vent  Neuro:  Sedated, RASS -2 on fent gtt  HEENT:  Mm moist, ETT  Cardiovascular:  s1s2  rrr, frequent PVC's  Lungs:  resps even non labored on vent, coarse  Abdomen:  Round, soft, hypoactive bs  Musculoskeletal:  Warm and dry, no sig edema   LABS:  BMET  Recent Labs Lab 10/27/16 2300 10/27/16 2301 10/28/16 0615 10/29/16 0535  NA 132* 135 135 135  K 3.8 4.1 4.7 4.3  CL 97* 98* 106 102  CO2 23  --  18* 23  BUN 21* 22* 22* 26*  CREATININE 2.23* 2.10* 2.20* 1.09  GLUCOSE 120* 120* 144* 209*    Electrolytes  Recent Labs Lab 10/27/16 2300 10/28/16 0615 10/28/16 1532 10/29/16 0535  CALCIUM 8.3* 7.1*  --  7.6*  MG  --   --  1.2* 1.3*  PHOS  --   --  3.2 2.4*    CBC  Recent Labs Lab 10/27/16 2300 10/27/16 2301 10/28/16 0615 10/29/16 0535  WBC 33.0*  --  28.4* 26.1*  HGB 13.8 15.3 12.4* 11.2*  HCT 42.1 45.0 39.2 34.9*  PLT 273  --  260 235    Coag's No results for input(s): APTT, INR in the last 168 hours.  Sepsis Markers  Recent Labs Lab 10/28/16 0217 10/28/16 0551 10/28/16 0940 10/28/16 1240 10/29/16 0535  LATICACIDVEN 2.67* 2.5* 2.3*  --   --   PROCALCITON  --   --   --  56.52 44.02    ABG  Recent Labs  Lab 10/28/16 0637 10/28/16 1550 10/29/16 0425  PHART 7.261* 7.283* 7.392  PCO2ART 38.0 44.8 39.4  PO2ART 98.5 79.5* 59.3*    Liver Enzymes  Recent Labs Lab 10/27/16 2300 10/28/16 0615  AST 30 32  ALT 16* 18  ALKPHOS 65 55  BILITOT 0.5 1.1  ALBUMIN 2.9* 2.1*    Cardiac Enzymes  Recent Labs Lab 10/28/16 2147 10/29/16 0129 10/29/16 0800  TROPONINI 2.87* 0.61* 0.23*    Glucose  Recent Labs Lab 10/28/16 1143 10/28/16 1519 10/28/16 2047 10/28/16 2346 10/29/16 0413 10/29/16 0850  GLUCAP 265* 169* 126* 185* 209* 191*    Imaging Ct Head Wo Contrast  Result Date: 10/28/2016 CLINICAL DATA:  Altered mental status. EXAM: CT HEAD WITHOUT CONTRAST TECHNIQUE: Contiguous axial images were obtained from the base of the skull through the vertex without intravenous contrast. COMPARISON:  CT scan of March 27, 2011.  FINDINGS: Brain: No evidence of acute infarction, hemorrhage, hydrocephalus, extra-axial collection or mass lesion/mass effect. The appearance of the brain is stable compared to prior exam of 2012. Vascular: No hyperdense vessel or unexpected calcification. Skull: Normal. Negative for fracture or focal lesion. Sinuses/Orbits: Bilateral frontal, ethmoid, sphenoid and maxillary sinusitis is noted. Other: None. IMPRESSION: No acute intracranial abnormality seen. Pansinusitis is noted. This exam was interpreted in consultation with Dr. Lars Pinks, neuroradiologist. Electronically Signed   By: Marijo Conception, M.D.   On: 10/28/2016 15:11   Ct Chest Wo Contrast  Result Date: 10/28/2016 CLINICAL DATA:  Respiratory failure, fall. EXAM: CT CHEST WITHOUT CONTRAST TECHNIQUE: Multidetector CT imaging of the chest was performed following the standard protocol without IV contrast. COMPARISON:  Radiograph of October 27, 2016. CT scan of June 10, 2014. FINDINGS: Cardiovascular: There is no evidence of thoracic aortic aneurysm. Mediastinum/Nodes: Endotracheal tube is in grossly good position. Nasogastric tube is seen passing through esophagus into distal stomach. Stable mildly enlarged mediastinal adenopathy is noted which most likely is reactive in etiology. Lungs/Pleura: No pneumothorax is noted. Left posterior basilar airspace opacity is noted concerning for atelectasis or pneumonia. Large partially loculated right pleural effusion is noted with associated atelectasis of right lower lobe. Right upper lobe airspace opacity is noted concerning for atelectasis or pneumonia. Upper Abdomen: No acute abnormality. Musculoskeletal: No chest wall mass or suspicious bone lesions identified. IMPRESSION: Endotracheal and nasogastric tubes are in grossly good position. Left lower lobe pneumonia or atelectasis is noted. Large partially loculated right pleural effusion is noted with associated atelectasis of right lower lobe. Right upper  lobe pneumonia or atelectasis is noted. Electronically Signed   By: Marijo Conception, M.D.   On: 10/28/2016 15:31   Portable Chest Xray  Result Date: 10/29/2016 CLINICAL DATA:  Hypoxia EXAM: PORTABLE CHEST 1 VIEW COMPARISON:  October 28, 2016 chest CT and chest radiograph October 27, 2016 FINDINGS: Endotracheal tube tip is 2.6 cm above carina. Central catheter tip is in the superior vena cava. Nasogastric tube tip and side port are below the diaphragm. No pneumothorax. There is airspace consolidation throughout most of the right lung, progressed from 2 days prior. There is a moderate pleural effusion on the right. On the left, there is patchy atelectasis and consolidation medial left base, stable. Heart is upper normal in size. The pulmonary vascularity is within normal limits. No adenopathy evident. IMPRESSION: Tube and catheter positions as described without pneumothorax. Progression of consolidation and effusion on the right. Stable consolidation atelectasis left base. Stable cardiac silhouette. Electronically Signed   By: Lowella Grip III M.D.  On: 10/29/2016 07:18     STUDIES:  CT chest 11/26>>> 2D echo 11/26>>>  CULTURES: BC (at [facility] 11/25 >> GPC >>  BC x 2 11/26>>> Blood PCR 11/25 >> MRSA Urine 11/26>>> Sputum 11/26>>> Stool 11/26>>>  ANTIBIOTICS: Vanc 11/26>>> Levaquin 11/26>>> Aztreonam 11/26>>>  SIGNIFICANT EVENTS:   LINES/TUBES: ETT 11/26>>> R IJ cortis (EDP) 11/26>>>  DISCUSSION: 40yo male with hx DM, HTN, OSA on cpap admitted 11/26 with acute hypoxic respiratory failure, sepsis.   ASSESSMENT / PLAN:  PULMONARY Acute hypoxic respiratory failure  Hx OSA on CPAP  Presumed multilobar CAP, may be community MRSA based on cx data R effusion with apparent loculation and ? Some intrapleural air, possible lung abscess Recent fall  P:   Vent support - 8cc/kg  F/u CXR  ABG CT chest shows R effusion and suggests a complicated R pleural space, ? Pleural air.  Would like for TCTS to look at the CT scan before we consider standard chest tube. Will stop heparin in event they want to place a directed tube or to consider VATS Abx as above   CARDIOVASCULAR Shock - septic + hypovolemic in setting diarrhea. Although lactate reassuring. Has been volume resuscitated.  Hx HTN  NSTEMI, stress related [place], stress related P:  Stop heparin now for possible procedure.  Hold home lasix  Norepi to off, d/c vasopressin now Will likely stop stress dose steroids 11/28 TTE pending Will also check BLE venous dopplers for completeness given hx DVT Consult cardiology for CAD eval once stabilizing from acute process Hold heparin 11/27 as he may need chest tube  RENAL AKI - unknown baseline Scr, improved nonAG metabolic acidosis  P:   F/u chem  Gentle volume  Pharmacy to dose abx  D/c HCO3 gtt 11/26  GASTROINTESTINAL Diarrhea  P:   NPO  pepcid  Start TF on 11/28 if unable to extubate.   HEMATOLOGIC Leukocytosis  P:  Has been on heparin gtt, hold, add SCD F/u CBC   INFECTIOUS Septic shock Presumed multilobar CAP, possible community MRSA Possible R lung abscess / empyema Diarrhea  Fever  P:   abx as above -- PCN allergic  Check HIV, pending Follow cx data  ENDOCRINE DM    P:   SSI   NEUROLOGIC AMS - in setting sepsis, hypotension  UDS - opiates, benzos, amphetamines (done post-intubation) P:   RASS goal: -1 Fentanyl gtt, PRN versed    FAMILY  - Updates:  Wife updated at bedside 11/26 and by [place] 11/27  - Inter-disciplinary family meet or Palliative Care meeting due by:  12/3   Independent CC time 80 minutes  Baltazar Apo, MD, PhD 10/29/2016, 12:16 PM [facility] [phone] or if no answer [phone]

## 2016-10-30 ENCOUNTER — Inpatient Hospital Stay (HOSPITAL_COMMUNITY): Payer: Medicare Other

## 2016-10-30 DIAGNOSIS — R6521 Severe sepsis with septic shock: Secondary | ICD-10-CM

## 2016-10-30 DIAGNOSIS — A419 Sepsis, unspecified organism: Principal | ICD-10-CM

## 2016-10-30 DIAGNOSIS — J90 Pleural effusion, not elsewhere classified: Secondary | ICD-10-CM

## 2016-10-30 LAB — BASIC METABOLIC PANEL
Anion gap: 9 (ref 5–15)
BUN: 22 mg/dL — AB (ref 6–20)
CALCIUM: 8.1 mg/dL — AB (ref 8.9–10.3)
CHLORIDE: 102 mmol/L (ref 101–111)
CO2: 27 mmol/L (ref 22–32)
CREATININE: 0.92 mg/dL (ref 0.61–1.24)
GFR calc Af Amer: >60 mL/min (ref >60)
GFR calc non Af Amer: >60 mL/min (ref >60)
GLUCOSE: 116 mg/dL — AB (ref 65–99)
Potassium: 4.2 mmol/L (ref 3.5–5.1)
Sodium: 138 mmol/L (ref 135–145)

## 2016-10-30 LAB — CBC
HEMATOCRIT: 34.6 % — AB (ref 39.0–52.0)
HEMOGLOBIN: 11.3 g/dL — AB (ref 13.0–17.0)
MCH: 29.4 pg (ref 26.0–34.0)
MCHC: 32.7 g/dL (ref 30.0–36.0)
MCV: 89.9 fL (ref 78.0–100.0)
Platelets: 235 10*3/uL (ref 150–400)
RBC: 3.85 MIL/uL — ABNORMAL LOW (ref 4.22–5.81)
RDW: 13.9 % (ref 11.5–15.5)
WBC: 26.8 10*3/uL — ABNORMAL HIGH (ref 4.0–10.5)

## 2016-10-30 LAB — GLUCOSE, CAPILLARY
GLUCOSE-CAPILLARY: 121 mg/dL — AB (ref 65–99)
GLUCOSE-CAPILLARY: 112 mg/dL — AB (ref 65–99)
GLUCOSE-CAPILLARY: 121 mg/dL — AB (ref 65–99)
Glucose-Capillary: 115 mg/dL — ABNORMAL HIGH (ref 65–99)
Glucose-Capillary: 113 mg/dL — ABNORMAL HIGH (ref 65–99)

## 2016-10-30 LAB — PROCALCITONIN: Procalcitonin: 32.27 ng/mL

## 2016-10-30 LAB — MAGNESIUM: MAGNESIUM: 3.1 mg/dL — AB (ref 1.7–2.4)

## 2016-10-30 LAB — VANCOMYCIN, TROUGH: VANCOMYCIN TR: 8 ug/mL — AB (ref 15–20)

## 2016-10-30 MED ORDER — FUROSEMIDE 10 MG/ML IJ SOLN
40.0000 mg | Freq: Four times a day (QID) | INTRAMUSCULAR | Status: AC
  Administered 2016-10-30 – 2016-10-31 (×3): 40 mg via INTRAVENOUS
  Filled 2016-10-30 (×3): qty 4

## 2016-10-30 MED ORDER — MAGNESIUM SULFATE 4 GM/100ML IV SOLN
4.0000 g | Freq: Once | INTRAVENOUS | Status: AC
  Administered 2016-10-30: 4 g via INTRAVENOUS
  Filled 2016-10-30: qty 100

## 2016-10-30 MED ORDER — PRO-STAT SUGAR FREE PO LIQD
30.0000 mL | Freq: Four times a day (QID) | ORAL | Status: DC
  Administered 2016-10-30 (×3): 30 mL
  Filled 2016-10-30 (×4): qty 30

## 2016-10-30 MED ORDER — VITAL HIGH PROTEIN PO LIQD
1000.0000 mL | ORAL | Status: DC
  Administered 2016-10-30: 1000 mL

## 2016-10-30 MED ORDER — SODIUM CHLORIDE 0.9 % IV SOLN
1250.0000 mg | Freq: Three times a day (TID) | INTRAVENOUS | Status: DC
Start: 2016-10-30 — End: 2016-11-03
  Administered 2016-10-30 – 2016-11-03 (×14): 1250 mg via INTRAVENOUS
  Filled 2016-10-30 (×18): qty 1250

## 2016-10-30 MED ORDER — HEPARIN SODIUM (PORCINE) 5000 UNIT/ML IJ SOLN
5000.0000 [IU] | Freq: Three times a day (TID) | INTRAMUSCULAR | Status: DC
  Administered 2016-10-30 – 2016-11-08 (×27): 5000 [IU] via SUBCUTANEOUS
  Filled 2016-10-30 (×29): qty 1

## 2016-10-30 MED ORDER — POTASSIUM CHLORIDE 20 MEQ/15ML (10%) PO SOLN
40.0000 meq | Freq: Three times a day (TID) | ORAL | Status: AC
  Administered 2016-10-30 (×2): 40 meq
  Filled 2016-10-30 (×2): qty 30

## 2016-10-30 NOTE — Progress Notes (Signed)
Dr. Nelda Marseille advised of Xray results of cortis placement.

## 2016-10-30 NOTE — Progress Notes (Addendum)
Initial Nutrition Assessment  DOCUMENTATION CODES:   Obesity unspecified  INTERVENTION:    Initiate TF via NGT with Vital High Protein at goal rate of 55 ml/h (1320 ml per day) and Prostat 30 ml QID to provide 1720 kcals, 176 gm protein, 1104 ml free water daily.  NUTRITION DIAGNOSIS:   Inadequate oral intake related to inability to eat as evidenced by NPO status.  GOAL:   Provide needs based on ASPEN/SCCM guidelines  MONITOR:   Vent status, Labs, TF tolerance, I & O's  REASON FOR ASSESSMENT:   Consult Enteral/tube feeding initiation and management  ASSESSMENT:   40 yo male with hx DM, HTN, OSA on cpap, admitted 11/25 with PNA and pleural effusion. Developed acute hypoxic respiratory failure and required intubation on 11/26.   Patient is currently intubated on ventilator support MV: 13.7 L/min Temp (24hrs), Avg:99.3 F (37.4 C), Min:97.5 F (36.4 C), Max:100.9 F (38.3 C)  Received MD Consult for TF initiation and management. NGT in place. Nutrition focused physical exam completed.  No muscle or subcutaneous fat depletion noticed. Labs reviewed: magnesium elevated. CBG's: 115-121-112 Medications reviewed and include lasix and KCl.  Diet Order:  Diet NPO time specified  Skin:  Reviewed, no issues  Last BM:  11/24  Height:   Ht Readings from Last 1 Encounters:  10/28/16 6\' 3"  (1.905 m)    Weight:   Wt Readings from Last 1 Encounters:  10/28/16 285 lb 4.4 oz (129.4 kg)    Ideal Body Weight:  89.1 kg  BMI:  Body mass index is 35.66 kg/m.  Estimated Nutritional Needs:   Kcal:  [id number]  Protein:  >/= 178 gm  Fluid:  >/= 2.4 L  EDUCATION NEEDS:   No education needs identified at this time  Molli Barrows, [place], [place], [place] Pager [phone] After Hours Pager [phone]

## 2016-10-30 NOTE — Progress Notes (Signed)
Wakeup assessment terminated as per Richardson Landry Minor NP. Patient not a candidate for extubation today, surgery planned.

## 2016-10-30 NOTE — H&P (Deleted)
PULMONARY / CRITICAL CARE MEDICINE   Name: Eric Hayes MRN: [id number] DOB: March 12, 1976    ADMISSION DATE:  10/27/2016  REFERRING MD:  Forestine Na   CHIEF COMPLAINT:  Respiratory failure, hypotension   Brief: 40yo male with hx bipolar disorder, chronic pain, DM, previous hx DVT no longer on anticoagulation, HTN, OSA on CPAP, medication non-compliance presented 11/26 to [facility] with several days of fever, diarrhea and progressive dyspnea.  Reportedly had a fall several days prior to admit where he struck his chest and was wearing ace-wrap on his chest on presentation.  In ER he had progressively worsening WOB, hypoxia, hypotension and progressive AMS.  He required intubation shortly after arrival to ER and started on vasopressors.  Further w/u revealed AKI with Scr 2.2, lactate 4, nonAG metabolic acidosis.  He was tx to [facility] for further treatment.   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  No known sick contacts, no recent travel, no hemoptysis or chest pain.   Imaging Dg Chest Port 1 View  10/30/2016 Diffuse right lung airspace disease and left base atelectasis. Moderate to large right effusion.   STUDIES:  CT chest 11/26>>>LLL and RUL pneumonia or atelectasis. Large partially loculated right pleural effusion.   2D echo 11/26>>>  CULTURES: BC x 2 11/26>>>MRSE in one of two Urine 11/26>>>NGTD Sputum 11/26>>>GS with mod GPC in pairs/chains Stool 11/26>>>  ANTIBIOTICS: Vanc 11/26>>> Levaquin 11/26>>> Aztreonam 11/26>>>  SIGNIFICANT EVENTS: 11/26: admitted and intubated due to AMS, acute resp failure and hypotension  LINES/TUBES: ETT 11/26>>> R IJ cortis (EDP) 11/26>>>  SUBJECTIVE:  Intubated. On pressure support. Awake and responds to questions by nodding. Denies chest pain but reports RUQ pain.   VITAL SIGNS: BP 109/66   Pulse (!) 31   Temp 100.2 F (37.9 C) (Rectal) Comment (Src): Rectal probe  Resp 13   Ht 6\' 3"   (1.905 m)   Wt 285 lb 4.4 oz (129.4 kg)   SpO2 97%   BMI 35.66 kg/m   HEMODYNAMICS:    VENTILATOR SETTINGS: Vent Mode: PRVC FiO2 (%):  [40 %-45 %] 40 % Set Rate:  [22 bmp] 22 bmp Vt Set:  [530 mL] 530 mL PEEP:  [5 cmH20-8 cmH20] 5 cmH20 Pressure Support:  [5 cmH20] 5 cmH20 Plateau Pressure:  [20 cmH20-30 cmH20] 30 cmH20  INTAKE / OUTPUT: I/O last 3 completed shifts: In: 3981.6 [I.V.:3171.6; NG/GT:60; IV Piggyback:750] Out: 2395 [Urine:2295; Emesis/NG output:100]  PHYSICAL EXAMINATION: General: awake, weaning on pressure support Neuro:  Awake, moves ext, responds to questions by nodding HEENT:  ETT in places, PERRL Cardiovascular:  s1s2 rrr, some PVC's on monitor Lungs:  resps even non labored on vent, coarse, diminished air sounds over lower lung fields bilaterally Abdomen:  Round, soft, hypoactive bs  Musculoskeletal:  Warm and dry, no sig edema   LABS:  BMET  Recent Labs Lab 10/28/16 0615 10/29/16 0535 10/30/16 0445  NA 135 135 138  K 4.7 4.3 4.2  CL 106 102 102  CO2 18* 23 27  BUN 22* 26* 22*  CREATININE 2.20* 1.09 0.92  GLUCOSE 144* 209* 116*    Electrolytes  Recent Labs Lab 10/28/16 0615 10/28/16 1532 10/29/16 0535 10/30/16 0445  CALCIUM 7.1*  --  7.6* 8.1*  MG  --  1.2* 1.3* 3.1*  PHOS  --  3.2 2.4*  --     CBC  Recent Labs Lab 10/28/16 0615 10/29/16 0535 10/30/16 0445  WBC 28.4* 26.1* 26.8*  HGB  12.4* 11.2* 11.3*  HCT 39.2 34.9* 34.6*  PLT 260 235 235    Coag's No results for input(s): APTT, INR in the last 168 hours.  Sepsis Markers  Recent Labs Lab 10/28/16 0217 10/28/16 0551 10/28/16 0940 10/28/16 1240 10/29/16 0535 10/30/16 0445  LATICACIDVEN 2.67* 2.5* 2.3*  --   --   --   PROCALCITON  --   --   --  56.52 44.02 32.27    ABG  Recent Labs Lab 10/28/16 0637 10/28/16 1550 10/29/16 0425  PHART 7.261* 7.283* 7.392  PCO2ART 38.0 44.8 39.4  PO2ART 98.5 79.5* 59.3*    Liver Enzymes  Recent Labs Lab  10/27/16 2300 10/28/16 0615  AST 30 32  ALT 16* 18  ALKPHOS 65 55  BILITOT 0.5 1.1  ALBUMIN 2.9* 2.1*    Cardiac Enzymes  Recent Labs Lab 10/29/16 0129 10/29/16 0800 10/29/16 1329  TROPONINI 0.61* 0.23* 0.24*    Glucose  Recent Labs Lab 10/29/16 0850 10/29/16 1300 10/29/16 1552 10/29/16 1958 10/29/16 2343 10/30/16 0433  GLUCAP 191* 131* 84 28 102* 115*    DISCUSSION: 40yo male with hx DM, HTN, OSA on cpap admitted 11/26 with acute hypoxic respiratory failure, sepsis.   ASSESSMENT / PLAN:  PULMONARY Acute hypoxic respiratory failure  Hx OSA on CPAP  Presumed CAP. Elevated PCT CT with LLL and RUL pneumonia or atelectasis.  Pleural effusion: Large partially loculated right pleural effusion.  UDS with amphetamine, benzo and opiate Recent fall  P:   On PST 5/5. Will wait CVT recs for VAT before extubation May need thoracocentesis. CVT on board Last ABG with hypoxia. But Sating well Abx as above  Daily CXR  CARDIOVASCULAR Shock - likely septic + hypovolemic in setting diarrhea. Although lactate reassuring. Has been volume resuscitated.  Troponin down trended LE doppler without DVT LA trended down UDS with amphetamine, benzo and opiate. Has Rx for Adderrell and Benzo Hx HTN  P:  Off pressors KVO Continue Solu-cortef. Will starting weaning 11/29 F/u Echo  RENAL AKI - resolved nonAGMA resolving LA resolved I&O 1.4/+0.6/+11L P:   KVO. 11Ls up.  Consider diuresis F/u chem   GASTROINTESTINAL Diarrhea  P:   Start TF after thora Pepcid  GI panel   HEMATOLOGIC Leukocytosis  P:  SCD  Resume heparin SubQ if no thora today F/u CBC   INFECTIOUS Sepsis. Lactic acidosis improving Presumed CAP. Elevated PCT Diarrhea  Fever down trending Leukocytosis HIV nonreactive P:   Abx as above -- PCN allergic  Follow cultures  ENDOCRINE DM: ? meds at home P:   SSI   NEUROLOGIC AMS - in setting sepsis, hypotension: resolving  Bipolar P:    RASS goal:0 to -1 Fentanyl gtt, PRN versed.  On benzo at home Hold home adderal   FAMILY  - Updates:  No family at bedside  - Inter-disciplinary family meet or Palliative Care meeting due by:  12/3   Wendee Beavers, MD.  10/30/16 7:29 AM PGY-2

## 2016-10-30 NOTE — Progress Notes (Signed)
Patient Name: Eric Hayes Date of Encounter: 10/30/2016  Primary Cardiologist: [facility] Problem List     Active Problems:   Septic shock ([place])   AKI (acute kidney injury) ([place])   Community acquired pneumonia   Acute hypoxemic respiratory failure (HCC)   Elevated troponin   Pre-operative cardiovascular examination    Subjective   Intubated, sedated. Family at the bedside.   Inpatient Medications    Scheduled Meds: . budesonide (PULMICORT) nebulizer solution  0.5 mg Nebulization BID  . chlorhexidine gluconate (MEDLINE KIT)  15 mL Mouth Rinse BID  . Chlorhexidine Gluconate Cloth  6 each Topical Q0600  . famotidine (PEPCID) IV  20 mg Intravenous Q12H  . fentaNYL (SUBLIMAZE) injection  50 mcg Intravenous Once  . heparin subcutaneous  5,000 Units Subcutaneous Q8H  . hydrocortisone sod succinate (SOLU-CORTEF) inj  50 mg Intravenous Q6H  . insulin aspart  0-15 Units Subcutaneous Q4H  . ipratropium-albuterol  3 mL Nebulization QID  . levofloxacin (LEVAQUIN) IV  750 mg Intravenous Q24H  . mouth rinse  15 mL Mouth Rinse QID  . mupirocin ointment  1 application Nasal BID  . vancomycin  1,000 mg Intravenous Q12H   Continuous Infusions: . sodium chloride 10 mL/hr at 10/30/16 1100  . fentaNYL infusion INTRAVENOUS 200 mcg/hr (10/30/16 1100)   PRN Meds: acetaminophen, fentaNYL, midazolam   Vital Signs    Vitals:   10/30/16 0827 10/30/16 0900 10/30/16 1000 10/30/16 1017  BP:  122/65 (!) 119/58   Pulse:  (!) 34 (!) 36   Resp:  12 14   Temp: 98.2 F (36.8 C)     TempSrc: Oral     SpO2:  95% 99% 93%  Weight:      Height:        Intake/Output Summary (Last 24 hours) at 10/30/16 1124 Last data filed at 10/30/16 1100  Gross per 24 hour  Intake          1508.66 ml  Output             1790 ml  Net          -281.34 ml   Filed Weights   10/27/16 2251 10/28/16 0502  Weight: 280 lb (127 kg) 285 lb 4.4 oz (129.4 kg)    Physical Exam    GEN: Well nourished, well  developed, intubated/sedated HEENT: Grossly normal.  Neck: Supple, no JVD, carotid bruits, or masses. Cardiac: RRR, no murmurs, rubs, or gallops. No clubbing, cyanosis, edema.  Radials/DP/PT 2+ and equal bilaterally.  Respiratory:  Respirations regular and unlabored, course breath sounds. GI: Soft, nontender, nondistended, BS + x 4. MS: no deformity or atrophy. Skin: warm and dry, no rash. Neuro:  Intubated, sedated Psych: Intubated, sedated  Labs    CBC  Recent Labs  10/27/16 2300  10/29/16 0535 10/30/16 0445  WBC 33.0*  < > 26.1* 26.8*  NEUTROABS 30.1*  --   --   --   HGB 13.8  < > 11.2* 11.3*  HCT 42.1  < > 34.9* 34.6*  MCV 93.3  < > 91.1 89.9  PLT 273  < > 235 235  < > = values in this interval not displayed. Basic Metabolic Panel  Recent Labs  10/28/16 1532 10/29/16 0535 10/30/16 0445  NA  --  135 138  K  --  4.3 4.2  CL  --  102 102  CO2  --  23 27  GLUCOSE  --  209* 116*  BUN  --  26* 22*  CREATININE  --  1.09 0.92  CALCIUM  --  7.6* 8.1*  MG 1.2* 1.3* 3.1*  PHOS 3.2 2.4*  --    Liver Function Tests  Recent Labs  10/27/16 2300 10/28/16 0615  AST 30 32  ALT 16* 18  ALKPHOS 65 55  BILITOT 0.5 1.1  PROT 6.8 5.2*  ALBUMIN 2.9* 2.1*   No results for input(s): LIPASE, AMYLASE in the last 72 hours. Cardiac Enzymes  Recent Labs  10/29/16 0129 10/29/16 0800 10/29/16 1329  TROPONINI 0.61* 0.23* 0.24*   BNP Invalid input(s): POCBNP D-Dimer No results for input(s): DDIMER in the last 72 hours. Hemoglobin A1C No results for input(s): HGBA1C in the last 72 hours. Fasting Lipid Panel No results for input(s): CHOL, HDL, LDLCALC, TRIG, CHOLHDL, LDLDIRECT in the last 72 hours. Thyroid Function Tests No results for input(s): TSH, T4TOTAL, T3FREE, THYROIDAB in the last 72 hours.  Invalid input(s): FREET3  Telemetry    SR with Trigeminy  - Personally Reviewed  ECG    No recent - Personally Reviewed  Radiology    Ct Head Wo  Contrast  Result Date: 10/28/2016 CLINICAL DATA:  Altered mental status. EXAM: CT HEAD WITHOUT CONTRAST TECHNIQUE: Contiguous axial images were obtained from the base of the skull through the vertex without intravenous contrast. COMPARISON:  CT scan of March 27, 2011. FINDINGS: Brain: No evidence of acute infarction, hemorrhage, hydrocephalus, extra-axial collection or mass lesion/mass effect. The appearance of the brain is stable compared to prior exam of 2012. Vascular: No hyperdense vessel or unexpected calcification. Skull: Normal. Negative for fracture or focal lesion. Sinuses/Orbits: Bilateral frontal, ethmoid, sphenoid and maxillary sinusitis is noted. Other: None. IMPRESSION: No acute intracranial abnormality seen. Pansinusitis is noted. This exam was interpreted in consultation with Dr. Lars Pinks, neuroradiologist. Electronically Signed   By: Marijo Conception, M.D.   On: 10/28/2016 15:11   Ct Chest Wo Contrast  Result Date: 10/28/2016 CLINICAL DATA:  Respiratory failure, fall. EXAM: CT CHEST WITHOUT CONTRAST TECHNIQUE: Multidetector CT imaging of the chest was performed following the standard protocol without IV contrast. COMPARISON:  Radiograph of October 27, 2016. CT scan of June 10, 2014. FINDINGS: Cardiovascular: There is no evidence of thoracic aortic aneurysm. Mediastinum/Nodes: Endotracheal tube is in grossly good position. Nasogastric tube is seen passing through esophagus into distal stomach. Stable mildly enlarged mediastinal adenopathy is noted which most likely is reactive in etiology. Lungs/Pleura: No pneumothorax is noted. Left posterior basilar airspace opacity is noted concerning for atelectasis or pneumonia. Large partially loculated right pleural effusion is noted with associated atelectasis of right lower lobe. Right upper lobe airspace opacity is noted concerning for atelectasis or pneumonia. Upper Abdomen: No acute abnormality. Musculoskeletal: No chest wall mass or suspicious  bone lesions identified. IMPRESSION: Endotracheal and nasogastric tubes are in grossly good position. Left lower lobe pneumonia or atelectasis is noted. Large partially loculated right pleural effusion is noted with associated atelectasis of right lower lobe. Right upper lobe pneumonia or atelectasis is noted. Electronically Signed   By: Marijo Conception, M.D.   On: 10/28/2016 15:31   Dg Chest Port 1 View  Result Date: 10/30/2016 CLINICAL DATA:  Acute respiratory failure EXAM: PORTABLE CHEST 1 VIEW COMPARISON:  10/29/2016 FINDINGS: Support devices are unchanged. Moderate to large right pleural effusion with diffuse right lung airspace disease, stable. Left base atelectasis noted. Heart is borderline in size with mild vascular congestion. IMPRESSION: No significant change diffuse right lung airspace disease and left  base atelectasis. Moderate to large right effusion. Electronically Signed   By: Rolm Baptise M.D.   On: 10/30/2016 07:18   Portable Chest Xray  Result Date: 10/29/2016 CLINICAL DATA:  Hypoxia EXAM: PORTABLE CHEST 1 VIEW COMPARISON:  October 28, 2016 chest CT and chest radiograph October 27, 2016 FINDINGS: Endotracheal tube tip is 2.6 cm above carina. Central catheter tip is in the superior vena cava. Nasogastric tube tip and side port are below the diaphragm. No pneumothorax. There is airspace consolidation throughout most of the right lung, progressed from 2 days prior. There is a moderate pleural effusion on the right. On the left, there is patchy atelectasis and consolidation medial left base, stable. Heart is upper normal in size. The pulmonary vascularity is within normal limits. No adenopathy evident. IMPRESSION: Tube and catheter positions as described without pneumothorax. Progression of consolidation and effusion on the right. Stable consolidation atelectasis left base. Stable cardiac silhouette. Electronically Signed   By: Lowella Grip III M.D.   On: 10/29/2016 07:18     Cardiac Studies   Echo-pending  Patient Profile     40 yo male with PMH of diet-controlled DM, HTN, HLD, OA, OSA on CPAP, bipolar, DVT 2011, med non-compliance admitted with acute respiratory failure and sepsis. Felt to have a stress related NSTEMI, cardiology asked to see for preop eval for possible VATS procedure.   Assessment & Plan    1. Preop evaluation for possible VATS - he has a history of chest pain, not able to determine much about it, although it seems to be atypical.  - echo results pending to assess for WMA.Marland Kitchen - If EF is decreased, may increase surgical risk.   2. Elevated troponin - unclear significance in the setting of acute illness, no EKG changes noted. - levels pretty flat except for one level, elevated to 2.87>>0.24. Likely demand ischemia.   3. Ventricular bigeminy - Mg+ was low yesterday, but now elevated today. Still noted to have ongoing trigeminy on telemetry. May have slightly decreased.  - Other electrolytes stable.  - Echo pending.   Signed, Reino Bellis, NP  10/30/2016, 11:24 AM   I have examined the patient and reviewed assessment and plan and discussed with patient.  Agree with above as stated.  Patient intubated.  Await echo.  Trigeminy on tele which I presonally reviewed. Low level troponin in a different pattern than ACS.  Larae Grooms

## 2016-10-30 NOTE — Progress Notes (Signed)
PULMONARY / CRITICAL CARE MEDICINE   Name: Eric Hayes MRN: [id number] DOB: 1976-06-23    ADMISSION DATE:  10/27/2016  REFERRING MD:  Forestine Na   CHIEF COMPLAINT:  Respiratory failure, hypotension   Brief: 40yo male with hx bipolar disorder, chronic pain, DM, previous hx DVT no longer on anticoagulation, HTN, OSA on CPAP, medication non-compliance presented 11/26 to [facility] with several days of fever, diarrhea and progressive dyspnea.  Reportedly had a fall several days prior to admit where he struck his chest and was wearing ace-wrap on his chest on presentation.  In ER he had progressively worsening WOB, hypoxia, hypotension and progressive AMS.  He required intubation shortly after arrival to ER and started on vasopressors.  Further w/u revealed AKI with Scr 2.2, lactate 4, nonAG metabolic acidosis.  He was tx to [facility] for further treatment.   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  No known sick contacts, no recent travel, no hemoptysis or chest pain.   Imaging Dg Chest Port 1 View  10/30/2016 Diffuse right lung airspace disease and left base atelectasis. Moderate to large right effusion.   STUDIES:  CT chest 11/26>>>LLL and RUL pneumonia or atelectasis. Large partially loculated right pleural effusion.   2D echo 11/26>>>  CULTURES: BC x 2 11/26>>>coag negative staph in 1 out of 2. Urine 11/26>>>NGTD Sputum 11/26>>>GS with mod GPC in pairs/chains Stool 11/26>>>  ANTIBIOTICS: Vanc 11/26>>> Levaquin 11/26>>> Aztreonam 11/26>>>11/27  SIGNIFICANT EVENTS: 11/26: admitted and intubated due to AMS, acute resp failure and hypotension  LINES/TUBES: ETT 11/26>>> R IJ cortis (EDP) 11/26>>>  SUBJECTIVE:  Intubated. Arousable.  Following commands.  VITAL SIGNS: BP 114/60   Pulse 87   Temp 97.5 F (36.4 C) (Core (Comment))   Resp (!) 25   Ht 6\' 3"  (1.905 m)   Wt 129.4 kg (285 lb 4.4 oz)   SpO2 93%   BMI 35.66 kg/m    HEMODYNAMICS:    VENTILATOR SETTINGS: Vent Mode: PRVC FiO2 (%):  [40 %] 40 % Set Rate:  [22 bmp] 22 bmp Vt Set:  [530 mL] 530 mL PEEP:  [5 cmH20-8 cmH20] 5 cmH20 Pressure Support:  [5 cmH20] 5 cmH20 Plateau Pressure:  [20 cmH20-30 cmH20] 21 cmH20  INTAKE / OUTPUT: I/O last 3 completed shifts: In: 4011.6 [I.V.:3201.6; NG/GT:60; IV Piggyback:750] Out: 2395 [Urine:2295; Emesis/NG output:100]  PHYSICAL EXAMINATION: General: awake, weaning on pressure support Neuro:  Awake, moves ext, responds to questions by nodding HEENT:  ETT in places, PERRL, MMM Cardiovascular:  RRR, Nl S1/S2, -M/R/G. Lungs:  resps even non labored on vent, coarse, diminished air sounds over lower lung fields bilaterally R>L. Abdomen:  Round, soft, hypoactive bs  Musculoskeletal:  Warm and dry, no sig edema   LABS:  BMET  Recent Labs Lab 10/28/16 0615 10/29/16 0535 10/30/16 0445  NA 135 135 138  K 4.7 4.3 4.2  CL 106 102 102  CO2 18* 23 27  BUN 22* 26* 22*  CREATININE 2.20* 1.09 0.92  GLUCOSE 144* 209* 116*    Electrolytes  Recent Labs Lab 10/28/16 0615 10/28/16 1532 10/29/16 0535 10/30/16 0445  CALCIUM 7.1*  --  7.6* 8.1*  MG  --  1.2* 1.3* 3.1*  PHOS  --  3.2 2.4*  --    CBC  Recent Labs Lab 10/28/16 0615 10/29/16 0535 10/30/16 0445  WBC 28.4* 26.1* 26.8*  HGB 12.4* 11.2* 11.3*  HCT 39.2 34.9* 34.6*  PLT 260 235 235  Coag's No results for input(s): APTT, INR in the last 168 hours.  Sepsis Markers  Recent Labs Lab 10/28/16 0217 10/28/16 0551 10/28/16 0940 10/28/16 1240 10/29/16 0535 10/30/16 0445  LATICACIDVEN 2.67* 2.5* 2.3*  --   --   --   PROCALCITON  --   --   --  56.52 44.02 32.27   ABG  Recent Labs Lab 10/28/16 0637 10/28/16 1550 10/29/16 0425  PHART 7.261* 7.283* 7.392  PCO2ART 38.0 44.8 39.4  PO2ART 98.5 79.5* 59.3*   Liver Enzymes  Recent Labs Lab 10/27/16 2300 10/28/16 0615  AST 30 32  ALT 16* 18  ALKPHOS 65 55  BILITOT 0.5 1.1   ALBUMIN 2.9* 2.1*   Cardiac Enzymes  Recent Labs Lab 10/29/16 0129 10/29/16 0800 10/29/16 1329  TROPONINI 0.61* 0.23* 0.24*   Glucose  Recent Labs Lab 10/29/16 1552 10/29/16 1958 10/29/16 2343 10/30/16 0433 10/30/16 0823 10/30/16 1224  GLUCAP 84 31 102* 115* 121* 112*   DISCUSSION: 40yo male with hx DM, HTN, OSA on cpap admitted 11/26 with acute hypoxic respiratory failure, sepsis.   ASSESSMENT / PLAN:  PULMONARY Acute hypoxic respiratory failure  Hx OSA on CPAP  Presumed CAP. Elevated PCT CT with LLL and RUL pneumonia or atelectasis.  Pleural effusion: Large partially loculated right pleural effusion.  UDS with amphetamine, benzo and opiate Recent fall  P:   On PST 5/5. Aggressive diureses today then consider extubation in AM. Will need VATS eventually but not acute. Abx as above  Daily CXR  CARDIOVASCULAR Shock - likely septic + hypovolemic in setting diarrhea. Although lactate reassuring. Has been volume resuscitated.  Troponin down trended LE doppler without DVT LA trended down UDS with amphetamine, benzo and opiate. Has Rx for Adderrell and Benzo Hx HTN  P:  D/C pressors. KVO D/C hydrocortisone F/u Echo pending Cards following, appreciate input  RENAL AKI - resolved Acidosis resolving LA resolved I&O 1.4/+0.6/+11L P:   11 Ls up.  KVO  F/u chem. Lasix 40 mg IV q6 x3 doses BMET in AM Replace electrolytes as indicated  GASTROINTESTINAL Diarrhea  P:   TF per nutrition Pepcid   HEMATOLOGIC Leukocytosis  P:  Heparin SubQ.  Thora/VAT not today, eventually F/u CBC   INFECTIOUS Sepsis. Lactic acidosis improving Presumed CAP. Elevated PCT Diarrhea  Fever down trending Leukocytosis HIV nonreactive P:   Abx as above -- PCN allergic  Follow cultures Levaquin/vancomycin  ENDOCRINE DM: ? meds at home P:   SSI  CBGs  NEUROLOGIC AMS - in setting sepsis, hypotension: resolving  Bipolar P:   RASS goal:0 to -1 Fentanyl gtt,  PRN versed.  On benzo at home Hold home adderal D/C in AM for extubation.  FAMILY  - Updates:  No family at bedside  - Inter-disciplinary family meet or Palliative Care meeting due by:  12/3  The patient is critically ill with multiple organ systems failure and requires high complexity decision making for assessment and support, frequent evaluation and titration of therapies, application of advanced monitoring technologies and extensive interpretation of multiple databases.   Critical Care Time devoted to patient care services described in this note is  35  Minutes. This time reflects time of care of this signee Dr Jennet Maduro. This critical care time does not reflect procedure time, or teaching time or supervisory time of PA/NP/Med student/Med Resident etc but could involve care discussion time.  Rush Farmer, M.D. [facility] Pulmonary/Critical Care Medicine. Pager: [phone]. After hours pager: [phone].

## 2016-10-30 NOTE — Procedures (Signed)
Central Venous Catheter Insertion Procedure Note Eric Hayes [id number] 21-Jan-1976  Procedure: Insertion of Central Venous Catheter Indications: Drug and/or fluid administration  Procedure Details Consent: Unable to obtain consent because of emergent medical necessity. Time Out: Verified patient identification, verified procedure, site/side was marked, verified correct patient position, special equipment/implants available, medications/allergies/relevent history reviewed, required imaging and test results available.  Performed  Maximum sterile technique was used including antiseptics, cap, gloves, gown, hand hygiene, mask and sheet. Skin prep: Chlorhexidine; guide wire change using sterile technique  A antimicrobial bonded/coated triple lumen catheter was placed in the right internal jugular vein using the Seldinger technique.  Evaluation Blood flow good Complications: No apparent complications Patient did tolerate procedure well. Chest X-ray ordered to verify placement.  CXR: pending.  Eric Hayes 10/30/2016, 3:07 PM  Rush Farmer, M.D. [facility] Pulmonary/Critical Care Medicine. Pager: [phone]. After hours pager: [phone].

## 2016-10-30 NOTE — Progress Notes (Signed)
RT note:  Upon arrival to room for vent check, patient noted to be dysynchronous and breath stacking.  Evaluation of current vent settings revealed inaccurate tidal volume.  Patient is 6'2" which equates to an 47ml/kg tidal volume of 661ml.  Upon changing volume patient became synchronous with ventilator and appeared much more comfortable.  RR dropped to 18 to maintain same minute ventilation.  Elink called for notification, RT waiting for call back at this time.

## 2016-10-30 NOTE — Progress Notes (Signed)
[place] Progress Note Patient Name: Eric Hayes DOB: 31-Jan-1976 MRN: [id number]   Date of Service  10/30/2016  HPI/Events of Note  Trigem, asympto Only got mag 2 grams for 1.3  eICU Interventions  Mag 4 gram, re assess lytres     Intervention Category Major Interventions: Arrhythmia - evaluation and management  FEINSTEIN,DANIEL J. 10/30/2016, 12:01 AM

## 2016-10-30 NOTE — Progress Notes (Signed)
Pharmacy Antibiotic Note  Eric Hayes is a 40 y.o. male admitted on 10/27/2016 with pneumonia and pleural effusion.  Pharmacy has been consulted for vancomycin dosing. Patient is afebrile with WBC elevated at 26.8, PCT 44, and LA 2.3. Vancomycin trough subtherapeutic at 8 mcg/mL on vancomycin 1g q12hr. Of note, patient on stress dose steroids, likely contributing to elevated WBCs. Will dose adjust vancomycin to target goal trough 15-20 mcg/mL.   Plan: Change vancomycin to 1250mg  IV every 8 hours. Goal trough 15-20 mcg/mL.  Continue Levaquin 750mg  IV q24h (CCM dosing) Monitor culture data, renal function and clinical course VT at steady state and as needed  Height: 6\' 3"  (190.5 cm) Weight: 285 lb 4.4 oz (129.4 kg) IBW/kg (Calculated) : 84.5  Temp (24hrs), Avg:99.4 F (37.4 C), Min:97.5 F (36.4 C), Max:100.9 F (38.3 C)   Recent Labs Lab 10/27/16 2300 10/27/16 2301 10/28/16 0217 10/28/16 0551 10/28/16 0615 10/28/16 0940 10/29/16 0535 10/30/16 0445 10/30/16 1113  WBC 33.0*  --   --   --  28.4*  --  26.1* 26.8*  --   CREATININE 2.23* 2.10*  --   --  2.20*  --  1.09 0.92  --   LATICACIDVEN  --  3.99* 2.67* 2.5*  --  2.3*  --   --   --   VANCOTROUGH  --   --   --   --   --   --   --   --  8*    Estimated Creatinine Clearance: 154.7 mL/min (by C-G formula based on SCr of 0.92 mg/dL).    Allergies  Allergen Reactions  . Penicillins Hives and Swelling    AS A CHILD    Antimicrobials this admission: Azactam 11/26 >> 11/27 Levaquin 11/26 >>  Vanc 11/26 >>  Dose adjustments this admission: N/a  Microbiology results: 11/25 BCx: 1/2 CONS 11/25 UCx: no growth   11/26 TA: Group A strep  11/26 MRSA PCR: positive  Argie Ramming, PharmD Pharmacy Resident  Pager [phone] 10/30/16 2:11 PM

## 2016-10-30 NOTE — Progress Notes (Signed)
PULMONARY / CRITICAL CARE MEDICINE   Name: JOSEPH CARLSTROM MRN: [id number] DOB: [birth year]    ADMISSION DATE:  10/27/2016  REFERRING MD:  Forestine Na   CHIEF COMPLAINT:  Respiratory failure, hypotension   Brief: 40yo male with hx bipolar disorder, chronic pain, DM, previous hx DVT no longer on anticoagulation, HTN, OSA on CPAP, medication non-compliance presented 11/26 to [facility] with several days of fever, diarrhea and progressive dyspnea.  Reportedly had a fall several days prior to admit where he struck his chest and was wearing ace-wrap on his chest on presentation.  In ER he had progressively worsening WOB, hypoxia, hypotension and progressive AMS.  He required intubation shortly after arrival to ER and started on vasopressors.  Further w/u revealed AKI with Scr 2.2, lactate 4, nonAG metabolic acidosis.  He was tx to [facility] for further treatment.   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  No known sick contacts, no recent travel, no hemoptysis or chest pain.   Imaging Dg Chest Port 1 View  10/30/2016 Diffuse right lung airspace disease and left base atelectasis. Moderate to large right effusion.   STUDIES:  CT chest 11/26>>>LLL and RUL pneumonia or atelectasis. Large partially loculated right pleural effusion.   2D echo 11/26>>>  CULTURES: BC x 2 11/26>>>MRSE in one of two Urine 11/26>>>NGTD Sputum 11/26>>>GS with mod GPC in pairs/chains Stool 11/26>>>  ANTIBIOTICS: Vanc 11/26>>> Levaquin 11/26>>> Aztreonam 11/26>>>  SIGNIFICANT EVENTS: 11/26: admitted and intubated due to AMS, acute resp failure and hypotension  LINES/TUBES: ETT 11/26>>> R IJ cortis (EDP) 11/26>>>  SUBJECTIVE:  Intubated. On pressure support. Awake and responds to questions by nodding. Denies chest pain but reports RUQ pain.   VITAL SIGNS: BP 122/65   Pulse (!) 34   Temp 98.2 F (36.8 C) (Oral)   Resp 12   Ht 6\' 3"  (1.905 m)   Wt 129.4 kg (285 lb  4.4 oz)   SpO2 93%   BMI 35.66 kg/m   HEMODYNAMICS:    VENTILATOR SETTINGS: Vent Mode: PRVC FiO2 (%):  [40 %-45 %] 40 % Set Rate:  [22 bmp] 22 bmp Vt Set:  [530 mL] 530 mL PEEP:  [5 cmH20-8 cmH20] 5 cmH20 Pressure Support:  [5 cmH20] 5 cmH20 Plateau Pressure:  [20 cmH20-30 cmH20] 21 cmH20  INTAKE / OUTPUT: I/O last 3 completed shifts: In: 4011.6 [I.V.:3201.6; NG/GT:60; IV Piggyback:750] Out: 2395 [Urine:2295; Emesis/NG output:100]  PHYSICAL EXAMINATION: General: awake, weaning on pressure support Neuro:  Awake, moves ext, responds to questions by nodding HEENT:  ETT in places, PERRL Cardiovascular:  s1s2 rrr, some PVC's on monitor Lungs:  resps even non labored on vent, coarse, diminished air sounds over lower lung fields bilaterally Abdomen:  Round, soft, hypoactive bs  Musculoskeletal:  Warm and dry, no sig edema   LABS:  BMET  Recent Labs Lab 10/28/16 0615 10/29/16 0535 10/30/16 0445  NA 135 135 138  K 4.7 4.3 4.2  CL 106 102 102  CO2 18* 23 27  BUN 22* 26* 22*  CREATININE 2.20* 1.09 0.92  GLUCOSE 144* 209* 116*    Electrolytes  Recent Labs Lab 10/28/16 0615 10/28/16 1532 10/29/16 0535 10/30/16 0445  CALCIUM 7.1*  --  7.6* 8.1*  MG  --  1.2* 1.3* 3.1*  PHOS  --  3.2 2.4*  --     CBC  Recent Labs Lab 10/28/16 0615 10/29/16 0535 10/30/16 0445  WBC 28.4* 26.1* 26.8*  HGB 12.4* 11.2* 11.3*  HCT 39.2 34.9* 34.6*  PLT 260 235 235    Coag's No results for input(s): APTT, INR in the last 168 hours.  Sepsis Markers  Recent Labs Lab 10/28/16 0217 10/28/16 0551 10/28/16 0940 10/28/16 1240 10/29/16 0535 10/30/16 0445  LATICACIDVEN 2.67* 2.5* 2.3*  --   --   --   PROCALCITON  --   --   --  56.52 44.02 32.27    ABG  Recent Labs Lab 10/28/16 0637 10/28/16 1550 10/29/16 0425  PHART 7.261* 7.283* 7.392  PCO2ART 38.0 44.8 39.4  PO2ART 98.5 79.5* 59.3*    Liver Enzymes  Recent Labs Lab 10/27/16 2300 10/28/16 0615  AST 30 32   ALT 16* 18  ALKPHOS 65 55  BILITOT 0.5 1.1  ALBUMIN 2.9* 2.1*    Cardiac Enzymes  Recent Labs Lab 10/29/16 0129 10/29/16 0800 10/29/16 1329  TROPONINI 0.61* 0.23* 0.24*    Glucose  Recent Labs Lab 10/29/16 1300 10/29/16 1552 10/29/16 1958 10/29/16 2343 10/30/16 0433 10/30/16 0823  GLUCAP 131* 84 86 102* 115* 121*    DISCUSSION: 40yo male with hx DM, HTN, OSA on cpap admitted 11/26 with acute hypoxic respiratory failure, sepsis.   ASSESSMENT / PLAN:  PULMONARY Acute hypoxic respiratory failure  Hx OSA on CPAP  Presumed CAP. Elevated PCT CT with LLL and RUL pneumonia or atelectasis.  Pleural effusion: Large partially loculated right pleural effusion.  UDS with amphetamine, benzo and opiate Recent fall  P:   On PST 5/5. Will attempt extubation. No urgent indication for thora per CVT He may need VAT at one point Last ABG with hypoxia. But Sating well Abx as above  Daily CXR  CARDIOVASCULAR Shock - likely septic + hypovolemic in setting diarrhea. Although lactate reassuring. Has been volume resuscitated.  Troponin down trended LE doppler without DVT LA trended down UDS with amphetamine, benzo and opiate. Has Rx for Adderrell and Benzo Hx HTN  P:  Off pressors KVO Continue Solu-cortef. Will starting weaning 11/29 F/u Echo  RENAL AKI - resolved nonAGMA resolving LA resolved I&O 1.4/+0.6/+11L P:   11Ls up.  KVO  F/u chem   GASTROINTESTINAL Diarrhea  P:   Start TF after thora Pepcid  GI panel   HEMATOLOGIC Leukocytosis  P:  Heparin SubQ. Thora/VAT unlikely today F/u CBC   INFECTIOUS Sepsis. Lactic acidosis improving Presumed CAP. Elevated PCT Diarrhea  Fever down trending Leukocytosis HIV nonreactive P:   Abx as above -- PCN allergic  Follow cultures  ENDOCRINE DM: ? meds at home P:   SSI   NEUROLOGIC AMS - in setting sepsis, hypotension: resolving  Bipolar P:   RASS goal:0 to -1 Fentanyl gtt, PRN versed.  On benzo at  home Hold home adderal   FAMILY  - Updates:  No family at bedside  - Inter-disciplinary family meet or Palliative Care meeting due by:  12/3   Wendee Beavers, MD.  10/30/16 10:36 AM PGY-2  Rush Farmer, M.D. [facility] Pulmonary/Critical Care Medicine. Pager: [phone]. After hours pager: [phone].

## 2016-10-30 NOTE — Progress Notes (Signed)
      [address]       [place] [address]             [phone]           Subjective: Patient remains intubated. Briefly opened eyes when I spoke to him this am.  Objective: Vital signs in last 24 hours: Temp:  [97.9 F (36.6 C)-100.9 F (38.3 C)] 100.2 F (37.9 C) (11/28 0000) Pulse Rate:  [28-121] 28 (11/28 0700) Cardiac Rhythm: Normal sinus rhythm (11/28 0740) Resp:  [10-27] 10 (11/28 0700) BP: (96-133)/(55-86) 108/60 (11/28 0700) SpO2:  [89 %-100 %] 97 % (11/28 0700) Arterial Line BP: (95-206)/(56-202) 206/202 (11/28 0430) FiO2 (%):  [40 %-45 %] 40 % (11/28 0400)      Intake/Output from previous day: 11/27 0701 - 11/28 0700 In: 2185.7 [I.V.:1375.7; NG/GT:60; IV Piggyback:750] Out: [id number] [Urine:1415; Emesis/NG output:100]   Physical Exam:  Pulmonary: Diminished right base and mostly clear on the left.   Lab Results: CBC: Recent Labs  10/29/16 0535 10/30/16 0445  WBC 26.1* 26.8*  HGB 11.2* 11.3*  HCT 34.9* 34.6*  PLT 235 235   BMET:  Recent Labs  10/29/16 0535 10/30/16 0445  NA 135 138  K 4.3 4.2  CL 102 102  CO2 23 27  GLUCOSE 209* 116*  BUN 26* 22*  CREATININE 1.09 0.92  CALCIUM 7.6* 8.1*    PT/INR: No results for input(s): LABPROT, INR in the last 72 hours. ABG:  INR: Will add last result for INR, ABG once components are confirmed Will add last 4 CBG results once components are confirmed  Assessment/Plan:  1. CV-s/p NSTEMI. Has had bi and tri geminy. Cardiology following. 2.  Pulmonary - Acute respiratory failure. Vent management per pulmonary/CCM. CT showed moderate right pleural effusion (likely para pneumonic). Will likely eventually need VATS. 3. ID-WBC 26,800. Fever to 100.9. On Levaquin and Vancomycin for PNA.   Sylvania Moss MPA-C 10/30/2016,8:05 AM

## 2016-10-31 ENCOUNTER — Inpatient Hospital Stay (HOSPITAL_COMMUNITY): Payer: Medicare Other | Admitting: Anesthesiology

## 2016-10-31 ENCOUNTER — Inpatient Hospital Stay (HOSPITAL_COMMUNITY): Payer: Medicare Other

## 2016-10-31 ENCOUNTER — Encounter (HOSPITAL_COMMUNITY)
Admission: EM | Disposition: A | Discharge status: Home Health | Payer: Self-pay | Source: Home / Self Care | Attending: Pulmonary Disease

## 2016-10-31 DIAGNOSIS — J869 Pyothorax without fistula: Secondary | ICD-10-CM

## 2016-10-31 DIAGNOSIS — R0600 Dyspnea, unspecified: Secondary | ICD-10-CM

## 2016-10-31 HISTORY — PX: VIDEO ASSISTED THORACOSCOPY: SHX5073

## 2016-10-31 LAB — ECHOCARDIOGRAM COMPLETE
CHL CUP DOP CALC LVOT VTI: 23.2 cm
CHL CUP MV DEC (S): 165
CHL CUP STROKE VOLUME: 70 mL
E decel time: 165 msec
FS: 30 % (ref 28–44)
HEIGHTINCHES: 75 in
IV/PV OW: 1
LA ID, A-P, ES: 36 mm
LA diam index: 1.34 cm/m2
LA vol index: 12.9 mL/m2
LA vol: 34.5 mL
LAVOLA4C: 35.9 mL
LDCA: 3.46 cm2
LEFT ATRIUM END SYS DIAM: 36 mm
LV PW d: 9 mm — AB (ref 0.6–1.1)
LV SIMPSON'S DISK: 52
LV TDI E'LATERAL: 13.9
LV TDI E'MEDIAL: 7.62
LV dias vol index: 50 mL/m2
LV dias vol: 135 mL (ref 62–150)
LV e' LATERAL: 13.9 cm/s
LV sys vol index: 24 mL/m2
LV sys vol: 66 mL — AB (ref 21–61)
LVOT peak grad rest: 8 mmHg
LVOTD: 21 mm
LVOTPV: 138 cm/s
LVOTSV: 80 mL
MVPKEVEL: 1.2 m/s
TAPSE: 19.5 mm
WEIGHTICAEL: 4649.06 [oz_av]

## 2016-10-31 LAB — VANCOMYCIN, TROUGH: VANCOMYCIN TR: 20 ug/mL (ref 15–20)

## 2016-10-31 LAB — BLOOD GAS, ARTERIAL
ACID-BASE EXCESS: 7.1 mmol/L — AB (ref 0.0–2.0)
BICARBONATE: 31.2 mmol/L — AB (ref 20.0–28.0)
DRAWN BY: 44956
FIO2: 40
LHR: 18 {breaths}/min
O2 SAT: 91 %
PEEP/CPAP: 5 cmH2O
PH ART: 7.457 — AB (ref 7.350–7.450)
Patient temperature: 98.6
VT: 680 mL
pCO2 arterial: 44.8 mmHg (ref 32.0–48.0)
pO2, Arterial: 62.4 mmHg — ABNORMAL LOW (ref 83.0–108.0)

## 2016-10-31 LAB — BASIC METABOLIC PANEL
ANION GAP: 10 (ref 5–15)
BUN: 25 mg/dL — AB (ref 6–20)
CHLORIDE: 103 mmol/L (ref 101–111)
CO2: 30 mmol/L (ref 22–32)
Calcium: 8 mg/dL — ABNORMAL LOW (ref 8.9–10.3)
Creatinine, Ser: 0.98 mg/dL (ref 0.61–1.24)
GFR calc Af Amer: >60 mL/min (ref >60)
GFR calc non Af Amer: >60 mL/min (ref >60)
Glucose, Bld: 108 mg/dL — ABNORMAL HIGH (ref 65–99)
POTASSIUM: 3.6 mmol/L (ref 3.5–5.1)
SODIUM: 143 mmol/L (ref 135–145)

## 2016-10-31 LAB — GLUCOSE, CAPILLARY
GLUCOSE-CAPILLARY: 101 mg/dL — AB (ref 65–99)
GLUCOSE-CAPILLARY: 170 mg/dL — AB (ref 65–99)
Glucose-Capillary: 69 mg/dL (ref 65–99)
Glucose-Capillary: 96 mg/dL (ref 65–99)
Glucose-Capillary: 91 mg/dL (ref 65–99)
Glucose-Capillary: 107 mg/dL — ABNORMAL HIGH (ref 65–99)

## 2016-10-31 LAB — LACTATE DEHYDROGENASE, PLEURAL OR PERITONEAL FLUID: LD FL: 2680 U/L — AB (ref 3–23)

## 2016-10-31 LAB — CBC
HCT: 35.4 % — ABNORMAL LOW (ref 39.0–52.0)
HEMOGLOBIN: 11.6 g/dL — AB (ref 13.0–17.0)
MCH: 29.6 pg (ref 26.0–34.0)
MCHC: 32.8 g/dL (ref 30.0–36.0)
MCV: 90.3 fL (ref 78.0–100.0)
Platelets: 237 10*3/uL (ref 150–400)
RBC: 3.92 MIL/uL — AB (ref 4.22–5.81)
RDW: 14.4 % (ref 11.5–15.5)
WBC: 17.5 10*3/uL — ABNORMAL HIGH (ref 4.0–10.5)

## 2016-10-31 LAB — BODY FLUID CELL COUNT WITH DIFFERENTIAL
Lymphs, Fluid: 13 %
MONOCYTE-MACROPHAGE-SEROUS FLUID: 22 % — AB (ref 50–90)
Neutrophil Count, Fluid: 65 % — ABNORMAL HIGH (ref 0–25)
WBC FLUID: 1140 uL — AB (ref 0–1000)

## 2016-10-31 LAB — GRAM STAIN

## 2016-10-31 LAB — CULTURE, RESPIRATORY W GRAM STAIN

## 2016-10-31 LAB — PHOSPHORUS: PHOSPHORUS: 2.6 mg/dL (ref 2.5–4.6)

## 2016-10-31 LAB — TYPE AND SCREEN
ABO/RH(D): O NEG
Antibody Screen: NEGATIVE

## 2016-10-31 LAB — PROTEIN, BODY FLUID: Total protein, fluid: <3 g/dL

## 2016-10-31 LAB — MAGNESIUM: Magnesium: 1.9 mg/dL (ref 1.7–2.4)

## 2016-10-31 LAB — CULTURE, RESPIRATORY

## 2016-10-31 LAB — GLUCOSE, SEROUS FLUID

## 2016-10-31 LAB — ABO/RH: ABO/RH(D): O NEG

## 2016-10-31 SURGERY — VIDEO ASSISTED THORACOSCOPY
Anesthesia: General | Site: Chest | Laterality: Right

## 2016-10-31 MED ORDER — FENTANYL CITRATE (PF) 250 MCG/5ML IJ SOLN
INTRAMUSCULAR | Status: AC
  Filled 2016-10-31: qty 5

## 2016-10-31 MED ORDER — PROPOFOL 500 MG/50ML IV EMUL
INTRAVENOUS | Status: DC | PRN
  Administered 2016-10-31: 75 ug/kg/min via INTRAVENOUS

## 2016-10-31 MED ORDER — FENTANYL CITRATE (PF) 100 MCG/2ML IJ SOLN
INTRAMUSCULAR | Status: DC | PRN
  Administered 2016-10-31: 100 ug via INTRAVENOUS
  Administered 2016-10-31: 50 ug via INTRAVENOUS
  Administered 2016-10-31: 150 ug via INTRAVENOUS
  Administered 2016-10-31 (×2): 100 ug via INTRAVENOUS

## 2016-10-31 MED ORDER — PROPOFOL 1000 MG/100ML IV EMUL
5.0000 ug/kg/min | INTRAVENOUS | Status: AC
  Filled 2016-10-31: qty 100

## 2016-10-31 MED ORDER — PROPOFOL 10 MG/ML IV BOLUS
INTRAVENOUS | Status: DC | PRN
  Administered 2016-10-31: 30 mg via INTRAVENOUS

## 2016-10-31 MED ORDER — SUGAMMADEX SODIUM 500 MG/5ML IV SOLN
INTRAVENOUS | Status: AC
  Filled 2016-10-31: qty 5

## 2016-10-31 MED ORDER — FUROSEMIDE 10 MG/ML IJ SOLN
40.0000 mg | Freq: Two times a day (BID) | INTRAMUSCULAR | Status: AC
  Administered 2016-10-31 (×2): 40 mg via INTRAVENOUS
  Filled 2016-10-31 (×2): qty 4

## 2016-10-31 MED ORDER — LACTATED RINGERS IV SOLN
INTRAVENOUS | Status: DC | PRN
  Administered 2016-10-31: 14:00:00 via INTRAVENOUS

## 2016-10-31 MED ORDER — MIDAZOLAM HCL 2 MG/2ML IJ SOLN
INTRAMUSCULAR | Status: AC
  Filled 2016-10-31: qty 4

## 2016-10-31 MED ORDER — MAGNESIUM SULFATE 2 GM/50ML IV SOLN
2.0000 g | Freq: Once | INTRAVENOUS | Status: AC
  Administered 2016-10-31: 2 g via INTRAVENOUS
  Filled 2016-10-31: qty 50

## 2016-10-31 MED ORDER — MIDAZOLAM HCL 5 MG/5ML IJ SOLN
INTRAMUSCULAR | Status: DC | PRN
  Administered 2016-10-31: 4 mg via INTRAVENOUS

## 2016-10-31 MED ORDER — PROPOFOL 500 MG/50ML IV EMUL
INTRAVENOUS | Status: DC | PRN
  Administered 2016-10-31: 75 ug via INTRAVENOUS

## 2016-10-31 MED ORDER — LIDOCAINE 2% (20 MG/ML) 5 ML SYRINGE
INTRAMUSCULAR | Status: AC
  Filled 2016-10-31: qty 5

## 2016-10-31 MED ORDER — WHITE PETROLATUM GEL
Status: AC
  Administered 2016-10-31: 06:00:00
  Filled 2016-10-31: qty 1

## 2016-10-31 MED ORDER — ONDANSETRON HCL 4 MG/2ML IJ SOLN
INTRAMUSCULAR | Status: AC
  Filled 2016-10-31: qty 2

## 2016-10-31 MED ORDER — 0.9 % SODIUM CHLORIDE (POUR BTL) OPTIME
TOPICAL | Status: DC | PRN
Start: 2016-10-31 — End: 2016-10-31
  Administered 2016-10-31: 2000 mL

## 2016-10-31 MED ORDER — PROPOFOL 10 MG/ML IV BOLUS
INTRAVENOUS | Status: AC
  Filled 2016-10-31: qty 20

## 2016-10-31 MED ORDER — ROCURONIUM BROMIDE 10 MG/ML (PF) SYRINGE
PREFILLED_SYRINGE | INTRAVENOUS | Status: AC
  Filled 2016-10-31: qty 20

## 2016-10-31 MED ORDER — PHENYLEPHRINE 40 MCG/ML (10ML) SYRINGE FOR IV PUSH (FOR BLOOD PRESSURE SUPPORT)
PREFILLED_SYRINGE | INTRAVENOUS | Status: AC
  Filled 2016-10-31: qty 10

## 2016-10-31 MED ORDER — DEXTROSE 50 % IV SOLN
INTRAVENOUS | Status: AC
  Administered 2016-10-31: 25 mL
  Filled 2016-10-31: qty 50

## 2016-10-31 MED ORDER — ROCURONIUM BROMIDE 100 MG/10ML IV SOLN
INTRAVENOUS | Status: DC | PRN
  Administered 2016-10-31: 20 mg via INTRAVENOUS
  Administered 2016-10-31: 30 mg via INTRAVENOUS
  Administered 2016-10-31: 20 mg via INTRAVENOUS
  Administered 2016-10-31: 100 mg via INTRAVENOUS

## 2016-10-31 MED ORDER — PERFLUTREN LIPID MICROSPHERE
1.0000 mL | INTRAVENOUS | Status: AC | PRN
  Administered 2016-10-31: 2 mL via INTRAVENOUS
  Filled 2016-10-31: qty 10

## 2016-10-31 MED ORDER — POTASSIUM CHLORIDE 20 MEQ PO PACK
40.0000 meq | PACK | Freq: Two times a day (BID) | ORAL | Status: DC
  Administered 2016-10-31 (×2): 40 meq
  Filled 2016-10-31 (×3): qty 2

## 2016-10-31 SURGICAL SUPPLY — 72 items
APPLIER CLIP ROT 10 11.4 M/L (STAPLE)
CANISTER SUCTION 2500CC (MISCELLANEOUS) ×2 IMPLANT
CATH KIT ON Q 5IN SLV (PAIN MANAGEMENT) IMPLANT
CATH THORACIC 28FR (CATHETERS) ×2 IMPLANT
CATH THORACIC 28FR RT ANG (CATHETERS) IMPLANT
CATH THORACIC 36FR (CATHETERS) IMPLANT
CATH THORACIC 36FR RT ANG (CATHETERS) IMPLANT
CLIP APPLIE ROT 10 11.4 M/L (STAPLE) IMPLANT
CLIP TI MEDIUM 6 (CLIP) IMPLANT
CONN ST 1/4X3/8  BEN (MISCELLANEOUS) ×2
CONN ST 1/4X3/8 BEN (MISCELLANEOUS) ×2 IMPLANT
CONN Y 3/8X3/8X3/8  BEN (MISCELLANEOUS) ×2
CONN Y 3/8X3/8X3/8 BEN (MISCELLANEOUS) ×2 IMPLANT
CONT SPEC 4OZ CLIKSEAL STRL BL (MISCELLANEOUS) ×8 IMPLANT
COVER SURGICAL LIGHT HANDLE (MISCELLANEOUS) ×2 IMPLANT
DERMABOND ADVANCED (GAUZE/BANDAGES/DRESSINGS) ×1
DERMABOND ADVANCED .7 DNX12 (GAUZE/BANDAGES/DRESSINGS) ×1 IMPLANT
DRAIN CHANNEL 28F RND 3/8 FF (WOUND CARE) ×4 IMPLANT
DRAIN CHANNEL 32F RND 10.7 FF (WOUND CARE) IMPLANT
DRAPE LAPAROSCOPIC ABDOMINAL (DRAPES) ×2 IMPLANT
DRAPE WARM FLUID 44X44 (DRAPE) ×2 IMPLANT
ELECT REM PT RETURN 9FT ADLT (ELECTROSURGICAL) ×2
ELECTRODE REM PT RTRN 9FT ADLT (ELECTROSURGICAL) ×1 IMPLANT
GAUZE SPONGE 4X4 12PLY STRL (GAUZE/BANDAGES/DRESSINGS) ×2 IMPLANT
GLOVE SURG SIGNA 7.5 PF LTX (GLOVE) ×4 IMPLANT
GOWN STRL REUS W/ TWL LRG LVL3 (GOWN DISPOSABLE) ×3 IMPLANT
GOWN STRL REUS W/ TWL XL LVL3 (GOWN DISPOSABLE) ×2 IMPLANT
GOWN STRL REUS W/TWL LRG LVL3 (GOWN DISPOSABLE) ×3
GOWN STRL REUS W/TWL XL LVL3 (GOWN DISPOSABLE) ×2
HEMOSTAT SURGICEL 2X14 (HEMOSTASIS) IMPLANT
KIT BASIN OR (CUSTOM PROCEDURE TRAY) ×2 IMPLANT
KIT ROOM TURNOVER OR (KITS) ×2 IMPLANT
KIT SUCTION CATH 14FR (SUCTIONS) ×2 IMPLANT
NS IRRIG 1000ML POUR BTL (IV SOLUTION) ×4 IMPLANT
PACK CHEST (CUSTOM PROCEDURE TRAY) ×2 IMPLANT
PAD ARMBOARD 7.5X6 YLW CONV (MISCELLANEOUS) ×4 IMPLANT
POUCH ENDO CATCH II 15MM (MISCELLANEOUS) IMPLANT
POUCH SPECIMEN RETRIEVAL 10MM (ENDOMECHANICALS) IMPLANT
SEALANT PROGEL (MISCELLANEOUS) IMPLANT
SEALANT SURG COSEAL 4ML (VASCULAR PRODUCTS) IMPLANT
SEALANT SURG COSEAL 8ML (VASCULAR PRODUCTS) IMPLANT
SOLUTION ANTI FOG 6CC (MISCELLANEOUS) ×2 IMPLANT
SPECIMEN JAR MEDIUM (MISCELLANEOUS) ×2 IMPLANT
SPONGE INTESTINAL PEANUT (DISPOSABLE) ×4 IMPLANT
SPONGE TONSIL 1 RF SGL (DISPOSABLE) ×2 IMPLANT
SUT PROLENE 4 0 RB 1 (SUTURE)
SUT PROLENE 4-0 RB1 .5 CRCL 36 (SUTURE) IMPLANT
SUT SILK  1 MH (SUTURE) ×4
SUT SILK 1 MH (SUTURE) ×4 IMPLANT
SUT SILK 2 0SH CR/8 30 (SUTURE) IMPLANT
SUT SILK 3 0SH CR/8 30 (SUTURE) IMPLANT
SUT VIC AB 1 CTX 36 (SUTURE)
SUT VIC AB 1 CTX36XBRD ANBCTR (SUTURE) IMPLANT
SUT VIC AB 2-0 CTX 36 (SUTURE) IMPLANT
SUT VIC AB 2-0 UR6 27 (SUTURE) IMPLANT
SUT VIC AB 3-0 MH 27 (SUTURE) IMPLANT
SUT VIC AB 3-0 X1 27 (SUTURE) ×2 IMPLANT
SUT VICRYL 2 TP 1 (SUTURE) IMPLANT
SWAB COLLECTION DEVICE MRSA (MISCELLANEOUS) IMPLANT
SYSTEM SAHARA CHEST DRAIN ATS (WOUND CARE) ×2 IMPLANT
TAPE CLOTH 4X10 WHT NS (GAUZE/BANDAGES/DRESSINGS) ×2 IMPLANT
TAPE CLOTH SURG 4X10 WHT LF (GAUZE/BANDAGES/DRESSINGS) ×2 IMPLANT
TIP APPLICATOR SPRAY EXTEND 16 (VASCULAR PRODUCTS) IMPLANT
TOWEL OR 17X24 6PK STRL BLUE (TOWEL DISPOSABLE) ×2 IMPLANT
TOWEL OR 17X26 10 PK STRL BLUE (TOWEL DISPOSABLE) ×4 IMPLANT
TRAP SPECIMEN MUCOUS 40CC (MISCELLANEOUS) IMPLANT
TRAY FOLEY CATH 16FRSI W/METER (SET/KITS/TRAYS/PACK) ×2 IMPLANT
TROCAR XCEL BLADELESS 5X75MML (TROCAR) ×2 IMPLANT
TROCAR XCEL NON-BLD 5MMX100MML (ENDOMECHANICALS) IMPLANT
TUBE ANAEROBIC SPECIMEN COL (MISCELLANEOUS) IMPLANT
TUNNELER SHEATH ON-Q 11GX8 DSP (PAIN MANAGEMENT) IMPLANT
WATER STERILE IRR 1000ML POUR (IV SOLUTION) ×4 IMPLANT

## 2016-10-31 NOTE — Brief Op Note (Addendum)
10/27/2016 - 10/31/2016  4:28 PM  PATIENT:  Eric Hayes  40 y.o. male  PRE-OPERATIVE DIAGNOSIS:  RIGHT EFFUSION  POST-OPERATIVE DIAGNOSIS:  EARLY ORGANIZING EMPYEMA  PROCEDURE:  Procedure(s):  VIDEO ASSISTED THORACOSCOPY  -Drainage of Early Empyema -Visceral and Parietal Decortication  SURGEON:  Surgeon(s) and Role:    * Melrose Nakayama, MD - Primary  PHYSICIAN ASSISTANT: Ellwood Handler PA-C  ANESTHESIA:   general  EBL:  Total I/O In: 380 [I.V.:280; IV Piggyback:100] Out: 2700 [Urine:2700]  BLOOD ADMINISTERED:none  DRAINS: 28 Blake x 2, 28 Straight Chest tube   LOCAL MEDICATIONS USED:  NONE  SPECIMEN:  Source of Specimen:  Pleural Fluid, Parietal, Visceral peel  DISPOSITION OF SPECIMEN:  PATHOLOGY  COUNTS:  YES  Dictated 5:10 PM 10/31/2016 TOURNIQUET:  * No tourniquets in log *  DICTATION: .Dragon Dictation  PLAN OF CARE: Admit to inpatient   PATIENT DISPOSITION:  ICU - intubated and hemodynamically stable.   Delay start of Pharmacological VTE agent (>24hrs) due to surgical blood loss or risk of bleeding: yes  FINDINGS: 1.75 liters of serous fluid. Fresh peel on visceral and parietal pleura

## 2016-10-31 NOTE — Progress Notes (Signed)
PULMONARY / CRITICAL CARE MEDICINE   Name: Eric Hayes MRN: [id number] DOB: 1976/06/24    ADMISSION DATE:  10/27/2016  REFERRING MD:  Forestine Na   CHIEF COMPLAINT:  Respiratory failure, hypotension   Brief: 40yo male with hx bipolar disorder, chronic pain, DM, previous hx DVT no longer on anticoagulation, HTN, OSA on CPAP, medication non-compliance presented 11/26 to [facility] with several days of fever, diarrhea and progressive dyspnea.  Reportedly had a fall several days prior to admit where he struck his chest and was wearing ace-wrap on his chest on presentation.  In ER he had progressively worsening WOB, hypoxia, hypotension and progressive AMS.  He required intubation shortly after arrival to ER and started on vasopressors.  Further w/u revealed AKI with Scr 2.2, lactate 4, nonAG metabolic acidosis.  He was tx to [facility] for further treatment.   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  No known sick contacts, no recent travel, no hemoptysis or chest pain.   Imaging Dg Chest Port 1 View 10/30/2016 Diffuse right lung airspace disease and left base atelectasis. Moderate to large right effusion.   Dg Chest Port 1 View 10/31/2016 still with right lung opacity and moderate to large effusion  STUDIES:  CT chest 11/26>>>LLL and RUL pneumonia or atelectasis. Large partially loculated right pleural effusion.   2D echo 11/26>>>  LE doppler 11/27>>without DVT  CULTURES: BC x 2 11/26>>>coag neg staph in 1 out of 2. Urine 11/26>>>NGTD Sputum 11/26>>>Mod Group A Strep Stool 11/26>>> MRSA PCR positive  ANTIBIOTICS: Vanc 11/26>>> Levaquin 11/26>>> Aztreonam 11/26>>>11/27  SIGNIFICANT EVENTS: 11/26: admitted and intubated due to AMS, acute resp failure and hypotension 11/28 Did well on minimal pressure support  LINES/TUBES: ETT 11/26>>> R IJ cortis (EDP) 11/26>>>11/28 CVC RIJ 11/28>> PIVs 11/26>> Foley and OGT 11/25>>  SUBJECTIVE:   Intubated. Arousable.  Following commands. Understands about the plan for the day by CTS today.   VITAL SIGNS: BP 114/81   Pulse (!) 116   Temp 99.3 F (37.4 C) (Oral)   Resp 17   Ht 6\' 3"  (1.905 m)   Wt 290 lb 9.1 oz (131.8 kg)   SpO2 93%   BMI 36.32 kg/m   HEMODYNAMICS:  Not on pressors  VENTILATOR SETTINGS: Vent Mode: PRVC FiO2 (%):  [40 %] 40 % Set Rate:  [18 bmp-22 bmp] 18 bmp Vt Set:  [530 mL-680 mL] 680 mL PEEP:  [5 cmH20] 5 cmH20 Plateau Pressure:  [18 cmH20-23 cmH20] 18 cmH20  INTAKE / OUTPUT: I/O last 3 completed shifts: In: 3068.5 [I.V.:1793.5; NG/GT:225; IV Piggyback:1050] Out: 2940 [Urine:2840; Emesis/NG output:100]  PHYSICAL EXAMINATION: General: awake, on PVRVC Neuro:  Awake, moves ext, responds to questions by nodding and writing HEENT:  ETT in places, PERRL, MMM Cardiovascular:  RRR, Nl S1/S2, -M/R/G. Lungs:  resps even non labored on vent, coarse, diminished air sounds over lower lung fields bilaterally R>L. Abdomen:  Round, soft, hypoactive bs  Musculoskeletal:  Warm and dry, no sig edema   LABS:  BMET  Recent Labs Lab 10/29/16 0535 10/30/16 0445 10/31/16 0310  NA 135 138 143  K 4.3 4.2 3.6  CL 102 102 103  CO2 23 27 30   BUN 26* 22* 25*  CREATININE 1.09 0.92 0.98  GLUCOSE 209* 116* 108*    Electrolytes  Recent Labs Lab 10/28/16 1532 10/29/16 0535 10/30/16 0445 10/31/16 0310  CALCIUM  --  7.6* 8.1* 8.0*  MG 1.2* 1.3* 3.1* 1.9  PHOS 3.2  2.4*  --  2.6   CBC  Recent Labs Lab 10/29/16 0535 10/30/16 0445 10/31/16 0310  WBC 26.1* 26.8* 17.5*  HGB 11.2* 11.3* 11.6*  HCT 34.9* 34.6* 35.4*  PLT 235 235 237   Coag's No results for input(s): APTT, INR in the last 168 hours.  Sepsis Markers  Recent Labs Lab 10/28/16 0217 10/28/16 0551 10/28/16 0940 10/28/16 1240 10/29/16 0535 10/30/16 0445  LATICACIDVEN 2.67* 2.5* 2.3*  --   --   --   PROCALCITON  --   --   --  56.52 44.02 32.27   ABG  Recent Labs Lab  10/28/16 1550 10/29/16 0425 10/31/16 0359  PHART 7.283* 7.392 7.457*  PCO2ART 44.8 39.4 44.8  PO2ART 79.5* 59.3* 62.4*   Liver Enzymes  Recent Labs Lab 10/27/16 2300 10/28/16 0615  AST 30 32  ALT 16* 18  ALKPHOS 65 55  BILITOT 0.5 1.1  ALBUMIN 2.9* 2.1*   Cardiac Enzymes  Recent Labs Lab 10/29/16 0129 10/29/16 0800 10/29/16 1329  TROPONINI 0.61* 0.23* 0.24*   Glucose  Recent Labs Lab 10/30/16 1224 10/30/16 1606 10/30/16 2011 10/31/16 0017 10/31/16 0410 10/31/16 0509  GLUCAP 112* 113* 121* 101* 69 170*   DISCUSSION: 40yo male with hx DM, HTN, OSA on cpap admitted 11/26 with acute hypoxic respiratory failure, sepsis.   ASSESSMENT / PLAN:  PULMONARY Acute hypoxic respiratory failure  Hx OSA on CPAP  Presumed CAP. Elevated PCT CT with LLL and RUL pneumonia or atelectasis.  Pleural effusion: Large partially loculated right pleural effusion.  UDS with amphetamine, benzo and opiate Recent fall  P:   Continue PRVC. Going to OR for drainage and chest tube placement this afternoon ABG with mild metabolic alkalosis and resp acidosis Duonebs q6h Pulmicort q12 Abx as above  Daily CXR  CARDIOVASCULAR Shock - likely septic + hypovolemic in setting diarrhea: resolved Troponin down trended LE doppler without DVT LA trended down UDS with amphetamine, benzo and opiate. Has Rx for Adderrell and Benzo Hx HTN  Ventricular Bigeminy  P:  D/Cd pressors. KVO D/C hydrocortisone F/u Echo pending Cards following, appreciate input  RENAL AKI - resolved Mild metabolic alkalosis with resp acidosis LA resolved I&O 4.1/-1.5/+9L P:   KVO  Lasix 40 mg IV twice a day x 2 doses Replaced K and Mg BMET in AM  GASTROINTESTINAL Diarrhea  P:   Holding TF for procedure Pepcid   HEMATOLOGIC Leukocytosis  P:  Heparin SubQ.  Thora/VAT today F/u CBC   INFECTIOUS Sepsis. Lactic acidosis improving Presumed CAP. Elevated PCT Fever curve down  trending Leukocytosis: improved HIV nonreactive P:   Abx as above -- PCN allergic  Follow cultures Levaquin/vancomycin  ENDOCRINE DM: ? meds at home P:   SSI  CBGs  NEUROLOGIC AMS - in setting sepsis, hypotension: resolving  Bipolar P:   RASS goal:0 to -1 On fentanyl gtt at 300 mcg this morning Fentanyl and versed as needed  On benzo at home Hold home adderal  FAMILY  - Updates:  No family at bedside  - Inter-disciplinary family meet or Palliative Care meeting due by:  12/3  Rush Farmer, M.D. [facility] Pulmonary/Critical Care Medicine. Pager: [phone]. After hours pager: [phone].

## 2016-10-31 NOTE — Progress Notes (Signed)
PULMONARY / CRITICAL CARE MEDICINE   Name: Eric Hayes MRN: [id number] DOB: Aug 14, 1976    ADMISSION DATE:  10/27/2016  REFERRING MD:  Forestine Na   CHIEF COMPLAINT:  Respiratory failure, hypotension   Brief: 40yo male with hx bipolar disorder, chronic pain, DM, previous hx DVT no longer on anticoagulation, HTN, OSA on CPAP, medication non-compliance presented 11/26 to [facility] with several days of fever, diarrhea and progressive dyspnea.  Reportedly had a fall several days prior to admit where he struck his chest and was wearing ace-wrap on his chest on presentation.  In ER he had progressively worsening WOB, hypoxia, hypotension and progressive AMS.  He required intubation shortly after arrival to ER and started on vasopressors.  Further w/u revealed AKI with Scr 2.2, lactate 4, nonAG metabolic acidosis.  He was tx to [facility] for further treatment.   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  No known sick contacts, no recent travel, no hemoptysis or chest pain.   Imaging Dg Chest Port 1 View 10/30/2016 Diffuse right lung airspace disease and left base atelectasis. Moderate to large right effusion.   Dg Chest Port 1 View 10/31/2016 still with right lung opacity and moderate to large effusion  STUDIES:  CT chest 11/26>>>LLL and RUL pneumonia or atelectasis. Large partially loculated right pleural effusion.   2D echo 11/26>>>  LE doppler 11/27>>without DVT  CULTURES: BC x 2 11/26>>>coag neg staph in 1 out of 2. Urine 11/26>>>NGTD Sputum 11/26>>>Mod Group A Strep Stool 11/26>>> MRSA PCR positive  ANTIBIOTICS: Vanc 11/26>>> Levaquin 11/26>>> Aztreonam 11/26>>>11/27  SIGNIFICANT EVENTS: 11/26: admitted and intubated due to AMS, acute resp failure and hypotension 11/28 Did well on minimal pressure support  LINES/TUBES: ETT 11/26>>> R IJ cortis (EDP) 11/26>>>11/28 CVC RIJ 11/28>> PIVs 11/26>> Foley and OGT 11/25>>  SUBJECTIVE:   Intubated. Arousable.  Following commands. Understands about the plan for the day by CTS today.   VITAL SIGNS: BP 102/61 (BP Location: Left Arm)   Pulse (!) 106   Temp 98.2 F (36.8 C) (Oral)   Resp 17   Ht 6\' 3"  (1.905 m)   Wt 131.8 kg (290 lb 9.1 oz)   SpO2 92%   BMI 36.32 kg/m   HEMODYNAMICS:  Not on pressors  VENTILATOR SETTINGS: Vent Mode: PRVC FiO2 (%):  [40 %] 40 % Set Rate:  [18 bmp-22 bmp] 18 bmp Vt Set:  [530 mL-680 mL] 680 mL PEEP:  [5 cmH20] 5 cmH20 Plateau Pressure:  [18 cmH20-27 cmH20] 27 cmH20  INTAKE / OUTPUT: I/O last 3 completed shifts: In: 3589.2 [I.V.:1332.2; NG/GT:840; IV [id number] Out: [id number] [Urine:5325; Emesis/NG output:100]  PHYSICAL EXAMINATION: General: Sedate, on PVRVC Neuro:  Sedate but withdraw all ext to command. HEENT:  ETT in places, PERRL, MMM Cardiovascular:  RRR, Nl S1/S2, -M/R/G. Lungs:  resps even non labored on vent, coarse, diminished air sounds over lower lung fields bilaterally R>L. Abdomen:  Round, soft, hypoactive bs  Musculoskeletal:  Warm and dry, no sig edema   LABS:  BMET  Recent Labs Lab 10/29/16 0535 10/30/16 0445 10/31/16 0310  NA 135 138 143  K 4.3 4.2 3.6  CL 102 102 103  CO2 23 27 30   BUN 26* 22* 25*  CREATININE 1.09 0.92 0.98  GLUCOSE 209* 116* 108*    Electrolytes  Recent Labs Lab 10/28/16 1532 10/29/16 0535 10/30/16 0445 10/31/16 0310  CALCIUM  --  7.6* 8.1* 8.0*  MG 1.2* 1.3* 3.1* 1.9  PHOS  3.2 2.4*  --  2.6   CBC  Recent Labs Lab 10/29/16 0535 10/30/16 0445 10/31/16 0310  WBC 26.1* 26.8* 17.5*  HGB 11.2* 11.3* 11.6*  HCT 34.9* 34.6* 35.4*  PLT 235 235 237   Coag's No results for input(s): APTT, INR in the last 168 hours.  Sepsis Markers  Recent Labs Lab 10/28/16 0217 10/28/16 0551 10/28/16 0940 10/28/16 1240 10/29/16 0535 10/30/16 0445  LATICACIDVEN 2.67* 2.5* 2.3*  --   --   --   PROCALCITON  --   --   --  56.52 44.02 32.27   ABG  Recent Labs Lab  10/28/16 1550 10/29/16 0425 10/31/16 0359  PHART 7.283* 7.392 7.457*  PCO2ART 44.8 39.4 44.8  PO2ART 79.5* 59.3* 62.4*   Liver Enzymes  Recent Labs Lab 10/27/16 2300 10/28/16 0615  AST 30 32  ALT 16* 18  ALKPHOS 65 55  BILITOT 0.5 1.1  ALBUMIN 2.9* 2.1*   Cardiac Enzymes  Recent Labs Lab 10/29/16 0129 10/29/16 0800 10/29/16 1329  TROPONINI 0.61* 0.23* 0.24*   Glucose  Recent Labs Lab 10/30/16 1606 10/30/16 2011 10/31/16 0017 10/31/16 0410 10/31/16 0509 10/31/16 0806  GLUCAP 113* 121* 101* 69 170* 96   DISCUSSION: 40yo male with hx DM, HTN, OSA on cpap admitted 11/26 with acute hypoxic respiratory failure, sepsis.   ASSESSMENT / PLAN:  PULMONARY Acute hypoxic respiratory failure  Hx OSA on CPAP  Presumed CAP. Elevated PCT CT with LLL and RUL pneumonia or atelectasis.  Pleural effusion: Large partially loculated right pleural effusion.  UDS with amphetamine, benzo and opiate Recent fall  P:   Continue PRVC. Going to OR for drainage and chest tube placement this afternoon ABG with mild metabolic alkalosis and resp acidosis Duonebs q6h Pulmicort q12 Abx as above  Daily CXR  CARDIOVASCULAR Shock - likely septic + hypovolemic in setting diarrhea: resolved Troponin down trended LE doppler without DVT LA trended down UDS with amphetamine, benzo and opiate. Has Rx for Adderrell and Benzo Hx HTN  Ventricular Bigeminy  P:  D/Cd pressors. KVO D/C hydrocortisone F/u Echo pending Cards following, appreciate input  RENAL AKI - resolved Mild metabolic alkalosis with resp acidosis LA resolved I&O 4.1/-1.5/+9L P:   KVO  Lasix 40 mg IV twice a day x 2 doses Replaced K and Mg BMET in AM  GASTROINTESTINAL Diarrhea  P:   Holding TF for procedure Pepcid   HEMATOLOGIC Leukocytosis  P:  Heparin SubQ.  Thora/VAT today F/u CBC   INFECTIOUS Sepsis. Lactic acidosis improving Presumed CAP. Elevated PCT Fever curve down trending Leukocytosis:  improved HIV nonreactive P:   Abx as above -- PCN allergic  Follow cultures Levaquin/vancomycin  ENDOCRINE DM: ? meds at home P:   SSI  CBGs  NEUROLOGIC AMS - in setting sepsis, hypotension: resolving  Bipolar P:   RASS goal:0 to -1 On fentanyl gtt at 300 mcg this morning Fentanyl and versed as needed  On benzo at home Hold home adderal  FAMILY  - Updates:  No family at bedside, will reevaluate post op.  - Inter-disciplinary family meet or Palliative Care meeting due by:  12/3  The patient is critically ill with multiple organ systems failure and requires high complexity decision making for assessment and support, frequent evaluation and titration of therapies, application of advanced monitoring technologies and extensive interpretation of multiple databases.   Critical Care Time devoted to patient care services described in this note is  35  Minutes. This time reflects time of  care of this signee Dr Jennet Maduro. This critical care time does not reflect procedure time, or teaching time or supervisory time of PA/NP/Med student/Med Resident etc but could involve care discussion time.  Rush Farmer, M.D. [facility] Pulmonary/Critical Care Medicine. Pager: [phone]. After hours pager: [phone].

## 2016-10-31 NOTE — Progress Notes (Signed)
Pharmacy Antibiotic Note  Eric Hayes is a 40 y.o. male admitted on 10/27/2016 with pneumonia and pleural effusion.  Pt on Levaquin and Vancomycin (Day #4).   Vancomycin trough 20 mcg/ml (therapeutic) on 1250mg  IV q8h. SCr back down to normal and stable x 72 hours.  Plan: Continue vancomycin 1250mg  IV every 8 hours. Goal trough 15-20 mcg/mL.  Continue Levaquin 750mg  IV q24h (CCM dosing) Monitor culture data, renal function and clinical course VT weekly or as needed  Height: 6\' 3"  (190.5 cm) Weight: 290 lb 9.1 oz (131.8 kg) IBW/kg (Calculated) : 84.5  Temp (24hrs), Avg:99.4 F (37.4 C), Min:98.2 F (36.8 C), Max:100.2 F (37.9 C)   Recent Labs Lab 10/27/16 2300 10/27/16 2301 10/28/16 0217 10/28/16 0551 10/28/16 0615 10/28/16 0940 10/29/16 0535 10/30/16 0445 10/30/16 1113 10/31/16 0310 10/31/16 2200  WBC 33.0*  --   --   --  28.4*  --  26.1* 26.8*  --  17.5*  --   CREATININE 2.23* 2.10*  --   --  2.20*  --  1.09 0.92  --  0.98  --   LATICACIDVEN  --  3.99* 2.67* 2.5*  --  2.3*  --   --   --   --   --   VANCOTROUGH  --   --   --   --   --   --   --   --  8*  --  20    Estimated Creatinine Clearance: 146.5 mL/min (by C-G formula based on SCr of 0.98 mg/dL).    Allergies  Allergen Reactions  . Penicillins Hives and Swelling    AS A CHILD    Antimicrobials this admission: Azactam 11/26 >> 11/27 Levaquin 11/26 >>  Vanc 11/26 >>  Dose adjustments this admission: 11/28 Vancomycin trough 8 mcg/mL on vancomycin 1g q12hr  Microbiology results: 11/25 BCx: 1/2 CONS 11/25 UCx: no growth   11/26 TA: Group A strep  11/26 MRSA PCR: positive  Sherlon Handing, PharmD, BCPS Clinical pharmacist, pager [phone] 10/31/16 11:48 PM

## 2016-10-31 NOTE — Anesthesia Procedure Notes (Signed)
Procedure Name: Intubation Date/Time: 10/31/2016 5:00 PM Performed by: Eligha Bridegroom Pre-anesthesia Checklist: Emergency Drugs available, Suction available, Patient being monitored and Timeout performed Oxygen Delivery Method: Circle system utilized Laryngoscope Size: Glidescope Laser Tube: Cuffed inflated with minimal occlusive pressure - saline Tube size: 8.5 mm Airway Equipment and Method: Video-laryngoscopy and C-Track utilized Placement Confirmation: ETT inserted through vocal cords under direct vision,  positive ETCO2 and breath sounds checked- equal and bilateral Secured at: 23 cm Tube secured with: Tape Dental Injury: Teeth and Oropharynx as per pre-operative assessment

## 2016-10-31 NOTE — Transfer of Care (Signed)
Immediate Anesthesia Transfer of Care Note  Patient: Eric Hayes  Procedure(s) Performed: Procedure(s): VIDEO ASSISTED THORACOSCOPY (Right)  Patient Location: ICU  Anesthesia Type:General  Level of Consciousness: sedated, unresponsive and Patient remains intubated per anesthesia plan  Airway & Oxygen Therapy: Patient remains intubated per anesthesia plan and Patient placed on Ventilator (see vital sign flow sheet for setting)  Post-op Assessment: Report given to RN and Post -op Vital signs reviewed and stable  Post vital signs: Reviewed and stable  Last Vitals:  Vitals:   10/31/16 1300 10/31/16 1400  BP: (!) 99/58 (!) 105/59  Pulse: (!) 107 (!) 109  Resp: 18 18  Temp:      Last Pain:  Vitals:   10/31/16 1141  TempSrc: Oral         Complications: No apparent anesthesia complications

## 2016-10-31 NOTE — Progress Notes (Signed)
Echocardiogram 2D Echocardiogram has been performed with definity.  Eric Hayes 10/31/2016, 12:41 PM

## 2016-10-31 NOTE — Anesthesia Preprocedure Evaluation (Addendum)
Anesthesia Evaluation  Patient identified by MRN, date of birth, ID band Patient unresponsive  General Assessment Comment:Critical ill patient from unit, resp failure and sepsis , pleural effusion  Reviewed: Unable to perform ROS - Chart review only  Airway Mallampati: Intubated       Dental  (+) Poor Dentition   Pulmonary sleep apnea and Continuous Positive Airway Pressure Ventilation , pneumonia, unresolved, Current Smoker,    + rhonchi  + decreased breath sounds      Cardiovascular hypertension,  Rhythm:Regular Rate:Tachycardia     Neuro/Psych    GI/Hepatic   Endo/Other  diabetes  Renal/GU      Musculoskeletal   Abdominal (+) + obese,   Peds  Hematology   Anesthesia Other Findings   Reproductive/Obstetrics                            Anesthesia Physical Anesthesia Plan  ASA: IV  Anesthesia Plan: General   Post-op Pain Management:    Induction:   Airway Management Planned: Double Lumen EBT and Oral ETT  Additional Equipment:   Intra-op Plan:   Post-operative Plan: Possible Post-op intubation/ventilation  Informed Consent:   Plan Discussed with:   Anesthesia Plan Comments:         Anesthesia Quick Evaluation

## 2016-10-31 NOTE — Progress Notes (Signed)
Pt brought in back from OR and placed on full vent support.  Due to desat, FIO2 increased to 60%.  Sat 93%.

## 2016-10-31 NOTE — Anesthesia Procedure Notes (Signed)
Procedure Name: Intubation Date/Time: 10/31/2016 2:37 PM Performed by: Lance Coon Pre-anesthesia Checklist: Patient identified, Emergency Drugs available, Suction available, Patient being monitored and Timeout performed Patient Re-evaluated:Patient Re-evaluated prior to inductionOxygen Delivery Method: Circle system utilized Preoxygenation: Pre-oxygenation with 100% oxygen Intubation Type: Inhalational induction Laryngoscope Size: Glidescope and 4 Endobronchial tube: Left and 41 Fr Number of attempts: 1 Airway Equipment and Method: Bougie stylet Placement Confirmation: breath sounds checked- equal and bilateral and positive ETCO2 Tube secured with: Tape Dental Injury: Teeth and Oropharynx as per pre-operative assessment  Comments: Indwelling ETT exchanged with 28f DLT with glidescope guidance, placement confirmed with bronchoscope per Dr. Delia Chimes

## 2016-10-31 NOTE — Progress Notes (Signed)
      [address]       [place] [address]             [phone]      Was unable to wean off vent yesterday  BP 112/63   Pulse (!) 102   Temp 98.2 F (36.8 C) (Oral)   Resp 17   Ht 6\' 3"  (1.905 m)   Wt 290 lb 9.1 oz (131.8 kg)   SpO2 92%   BMI 36.32 kg/m    Intake/Output Summary (Last 24 hours) at 10/31/16 1056 Last data filed at 10/31/16 1000  Gross per 24 hour  Intake          2694.83 ml  Output             5675 ml  Net         -2980.17 ml    Alert, cooperative Absent BS right base  CXR same to slightly larger effusion  Needs right VATS to drain effusion and decorticate the lung. I discussed the general nature of the procedure, need for general anesthesia and use of drainage tubes postop with Eric Hayes, his mother and also, by telephone, his wife. I reviewed the indications, risks, benefits and alternatives. They understand risks include but are not limited to death, MI, DVT, PE, bleeding, possible need for transfusion, infection, prolonged air leaks as well as other unforeseeable complications. He gives consent by nodding. Mrs. Dazey to give phone consent.  To OR this afternoon  Revonda Standard. Roxan Hockey, MD [facility] [phone]

## 2016-10-31 NOTE — Progress Notes (Signed)
Pt taken off vent to transport to OR by CRNA using an ambu bag. 

## 2016-11-01 ENCOUNTER — Encounter (HOSPITAL_COMMUNITY): Payer: Self-pay | Admitting: Thoracic Surgery (Cardiothoracic Vascular Surgery)

## 2016-11-01 ENCOUNTER — Inpatient Hospital Stay (HOSPITAL_COMMUNITY): Payer: Medicare Other

## 2016-11-01 LAB — GLUCOSE, CAPILLARY
GLUCOSE-CAPILLARY: 107 mg/dL — AB (ref 65–99)
GLUCOSE-CAPILLARY: 115 mg/dL — AB (ref 65–99)
GLUCOSE-CAPILLARY: 90 mg/dL (ref 65–99)
Glucose-Capillary: 105 mg/dL — ABNORMAL HIGH (ref 65–99)
Glucose-Capillary: 108 mg/dL — ABNORMAL HIGH (ref 65–99)
Glucose-Capillary: 111 mg/dL — ABNORMAL HIGH (ref 65–99)
Glucose-Capillary: 96 mg/dL (ref 65–99)

## 2016-11-01 LAB — BASIC METABOLIC PANEL
Anion gap: 8 (ref 5–15)
BUN: 23 mg/dL — AB (ref 6–20)
CALCIUM: 7.7 mg/dL — AB (ref 8.9–10.3)
CHLORIDE: 106 mmol/L (ref 101–111)
CO2: 28 mmol/L (ref 22–32)
CREATININE: 0.99 mg/dL (ref 0.61–1.24)
GFR calc Af Amer: >60 mL/min (ref >60)
GFR calc non Af Amer: >60 mL/min (ref >60)
Glucose, Bld: 100 mg/dL — ABNORMAL HIGH (ref 65–99)
Potassium: 4.6 mmol/L (ref 3.5–5.1)
SODIUM: 142 mmol/L (ref 135–145)

## 2016-11-01 LAB — POCT I-STAT 3, ART BLOOD GAS (G3+)
ACID-BASE EXCESS: 11 mmol/L — AB (ref 0.0–2.0)
Bicarbonate: 34.7 mmol/L — ABNORMAL HIGH (ref 20.0–28.0)
O2 Saturation: 94 %
PH ART: 7.516 — AB (ref 7.350–7.450)
PO2 ART: 64 mmHg — AB (ref 83.0–108.0)
TCO2: 36 mmol/L (ref 0–100)
pCO2 arterial: 43 mmHg (ref 32.0–48.0)

## 2016-11-01 LAB — CBC
HCT: 37.1 % — ABNORMAL LOW (ref 39.0–52.0)
Hemoglobin: 11.8 g/dL — ABNORMAL LOW (ref 13.0–17.0)
MCH: 29.1 pg (ref 26.0–34.0)
MCHC: 31.8 g/dL (ref 30.0–36.0)
MCV: 91.6 fL (ref 78.0–100.0)
Platelets: 255 10*3/uL (ref 150–400)
RBC: 4.05 MIL/uL — ABNORMAL LOW (ref 4.22–5.81)
RDW: 14.6 % (ref 11.5–15.5)
WBC: 30 10*3/uL — ABNORMAL HIGH (ref 4.0–10.5)

## 2016-11-01 LAB — CULTURE, BLOOD (ROUTINE X 2): Culture: NO GROWTH

## 2016-11-01 LAB — PATHOLOGIST SMEAR REVIEW: PATH REVIEW: NEGATIVE

## 2016-11-01 LAB — PHOSPHORUS: Phosphorus: 3.7 mg/dL (ref 2.5–4.6)

## 2016-11-01 LAB — MAGNESIUM: MAGNESIUM: 1.9 mg/dL (ref 1.7–2.4)

## 2016-11-01 LAB — ALBUMIN: Albumin: 1.7 g/dL — ABNORMAL LOW (ref 3.5–5.0)

## 2016-11-01 MED ORDER — MAGNESIUM HYDROXIDE 400 MG/5ML PO SUSP: 30.0000 mL | Freq: Once | ORAL | Status: DC

## 2016-11-01 MED ORDER — ACETAMINOPHEN 160 MG/5ML PO SOLN
1000.0000 mg | Freq: Four times a day (QID) | ORAL | Status: AC
Start: 2016-11-01 — End: 2016-11-06
  Administered 2016-11-01 – 2016-11-04 (×2): 1000 mg via ORAL
  Filled 2016-11-01 (×15): qty 40.6

## 2016-11-01 MED ORDER — SENNOSIDES-DOCUSATE SODIUM 8.6-50 MG PO TABS
1.0000 | ORAL_TABLET | Freq: Every day | ORAL | Status: DC
  Administered 2016-11-02 – 2016-11-07 (×5): 1 via ORAL
  Filled 2016-11-01 (×8): qty 1

## 2016-11-01 MED ORDER — ONDANSETRON HCL 4 MG/2ML IJ SOLN: 4.0000 mg | Freq: Four times a day (QID) | INTRAMUSCULAR | Status: DC | PRN

## 2016-11-01 MED ORDER — FENTANYL BOLUS VIA INFUSION
50.0000 ug | INTRAVENOUS | Status: DC | PRN
  Filled 2016-11-01: qty 75

## 2016-11-01 MED ORDER — LEVALBUTEROL HCL 0.63 MG/3ML IN NEBU
0.6300 mg | INHALATION_SOLUTION | Freq: Four times a day (QID) | RESPIRATORY_TRACT | Status: DC
  Administered 2016-11-01 – 2016-11-05 (×16): 0.63 mg via RESPIRATORY_TRACT
  Filled 2016-11-01 (×28): qty 3

## 2016-11-01 MED ORDER — FUROSEMIDE 10 MG/ML IJ SOLN
40.0000 mg | Freq: Two times a day (BID) | INTRAMUSCULAR | Status: DC
  Administered 2016-11-01 – 2016-11-03 (×5): 40 mg via INTRAVENOUS
  Filled 2016-11-01 (×5): qty 4

## 2016-11-01 MED ORDER — DEXTROSE-NACL 5-0.45 % IV SOLN: INTRAVENOUS | Status: DC

## 2016-11-01 MED ORDER — DOCUSATE SODIUM 50 MG/5ML PO LIQD: 100.0000 mg | Freq: Once | ORAL | Status: DC

## 2016-11-01 MED ORDER — BISACODYL 5 MG PO TBEC
10.0000 mg | DELAYED_RELEASE_TABLET | Freq: Every day | ORAL | Status: DC
  Administered 2016-11-03 – 2016-11-08 (×4): 10 mg via ORAL
  Filled 2016-11-01 (×4): qty 2

## 2016-11-01 MED ORDER — ACETAMINOPHEN 500 MG PO TABS
1000.0000 mg | ORAL_TABLET | Freq: Four times a day (QID) | ORAL | Status: AC
  Administered 2016-11-02 – 2016-11-05 (×13): 1000 mg via ORAL
  Filled 2016-11-01 (×22): qty 2

## 2016-11-01 MED ORDER — POTASSIUM CHLORIDE IN NACL 20-0.45 MEQ/L-% IV SOLN
INTRAVENOUS | Status: DC
  Filled 2016-11-01: qty 1000

## 2016-11-01 MED ORDER — DEXTROSE-NACL 5-0.45 % IV SOLN
INTRAVENOUS | Status: DC
  Administered 2016-11-01: 09:00:00 via INTRAVENOUS

## 2016-11-01 MED ORDER — OXYCODONE HCL 5 MG PO TABS
5.0000 mg | ORAL_TABLET | ORAL | Status: DC | PRN
  Administered 2016-11-02: 10 mg via ORAL
  Administered 2016-11-03: 5 mg via ORAL
  Administered 2016-11-03 (×2): 10 mg via ORAL
  Administered 2016-11-03: 5 mg via ORAL
  Administered 2016-11-04 (×4): 10 mg via ORAL
  Administered 2016-11-04: 5 mg via ORAL
  Administered 2016-11-05 – 2016-11-08 (×21): 10 mg via ORAL
  Filled 2016-11-01 (×13): qty 2
  Filled 2016-11-01: qty 1
  Filled 2016-11-01 (×16): qty 2
  Filled 2016-11-01: qty 1

## 2016-11-01 MED ORDER — VITAL HIGH PROTEIN PO LIQD
1000.0000 mL | ORAL | Status: DC
  Administered 2016-11-01: 1000 mL

## 2016-11-01 MED ORDER — MORPHINE SULFATE (PF) 2 MG/ML IV SOLN
2.0000 mg | INTRAVENOUS | Status: DC | PRN
  Administered 2016-11-01 – 2016-11-03 (×13): 2 mg via INTRAVENOUS
  Filled 2016-11-01 (×12): qty 1

## 2016-11-01 MED ORDER — FUROSEMIDE 10 MG/ML IJ SOLN
INTRAMUSCULAR | Status: AC
  Filled 2016-11-01: qty 4

## 2016-11-01 MED ORDER — PRO-STAT SUGAR FREE PO LIQD
30.0000 mL | Freq: Four times a day (QID) | ORAL | Status: DC
  Filled 2016-11-01 (×2): qty 30

## 2016-11-01 MED ORDER — LORAZEPAM 2 MG/ML IJ SOLN
1.0000 mg | INTRAMUSCULAR | Status: DC | PRN
  Administered 2016-11-01 – 2016-11-03 (×6): 2 mg via INTRAVENOUS
  Filled 2016-11-01 (×6): qty 1

## 2016-11-01 MED ORDER — SODIUM CHLORIDE 0.9 % IV SOLN
30.0000 meq | Freq: Every day | INTRAVENOUS | Status: DC | PRN
Start: 2016-11-01 — End: 2016-11-01

## 2016-11-01 NOTE — Progress Notes (Signed)
Patient removed right central line. pulling at lines took off O2. Education done patient alert and oriented states that he did not realize that he pulled out his line

## 2016-11-01 NOTE — Procedures (Signed)
Extubation Procedure Note   Patient Details:   Name: Eric Hayes DOB: July 08, 1976 MRN: [id number]   Airway Documentation:  Airway 8.5 mm (Active)  Secured at (cm) 23 cm 10/31/2016 12:00 AM    + leak test prior to extubation   Evaluation  O2 sats: stable throughout Complications: No apparent complications Patient did tolerate procedure well. Bilateral Breath Sounds: Diminished, Rhonchi   Yes, pt able to speak.  No distress noted, no stridor noted.  [facility] clear diminished.    Lenna Sciara 11/01/2016, 11:54 AM

## 2016-11-01 NOTE — Progress Notes (Signed)
Wasted fentanyl drip 25cc  In sink with witnessed by Polly Cobia RN

## 2016-11-01 NOTE — Op Note (Signed)
NAMEMarland Kitchen  Eric Hayes, Eric Hayes NO.:  [id number]  MEDICAL RECORD NO.:  [id number]  LOCATION:  3M01C                        FACILITY:  [facility]  PHYSICIAN:  Revonda Standard. Roxan Hockey, M.D.DATE OF BIRTH:  [birth year]  DATE OF PROCEDURE:  10/31/2016 DATE OF DISCHARGE:                              OPERATIVE REPORT   PREOPERATIVE DIAGNOSIS:  Right parapneumonic effusion.  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  PROCEDURE:   Right video-assisted thoracoscopy, Drainage of empyema, Visceral and parietal pleural decortication.  SURGEON:  Revonda Standard. Roxan Hockey, M.D.  ASSISTANT:  Ellwood Handler, PA.  ANESTHESIA:  General.  FINDINGS:  1.75 L of serous fluid evacuated.  Extensive early fibrinous peel on the visceral and parietal pleura.  Lung abscess in right lower lobe.  CLINICAL NOTE:  Eric Hayes is a 40 year old man, who was admitted on October 27, 2016 with sepsis.  He was treated with antibiotics.  He developed a large right pleural effusion over relatively brief period of time.  The patient was unable to wean from the ventilator and was advised to have a right VATS to drain the effusion and possible decortication.  The patient was intubated, but alert and responsive and cooperative.  The indications, risks, benefits, and alternatives were discussed in detail with the patient and his wife.  They both consented to the procedure.  OPERATIVE NOTE:  Eric Hayes was brought to the operating room on October 31, 2016.  He was already receiving intravenous vancomycin and levofloxacin.  PAS hose were in place, as was a Foley catheter.  He was intubated.  General anesthesia was induced, and the single-lumen tube was switched to a double-lumen tube.  Single lung ventilation of the left lung was initiated and was tolerated well throughout the procedure. He was placed in a left lateral decubitus position, and the right chest was prepped and draped in usual  sterile fashion.  An incision was made in the seventh interspace in the midaxillary line. The chest was entered bluntly using a hemostat.  A sucker was placed into the chest and 1.75 L of mostly serous, slightly murky fluid was evacuated.  The fluid was not purulent.  It was sent for cultures as well as cell counts and chemistries.  A 5 mm port was inserted through this incision, and the thoracoscope was advanced into the chest.  There was an extensive early fibrinous peel on both the visceral and parietal pleural surfaces.  A 5 cm working incision was made in the fifth interspace anterolaterally.  No rib spreading was performed during the procedure.  The peel was peeled off the visceral pleura. It came off relatively easy for the most part. Posteriorly on the lower lobe there was an underlying abscess with some purulent material draining from the abscesses as it was unroofed.  The peel was most extensive around the lower lobe, less so around the middle lobe and the least extensive on the upper lobe.  There was extensive fluid and fibrinous exudate in the fissures, which was cleared.  The chest was copiously irrigated with 2 L of saline.  After the visceral pleura had been decorticated, the parietal pleura was decorticated.  These were sent as  separate specimens for both culture and pathology.  After completing the parietal pleural decortication, the chest was again copiously irrigated with warm saline. A 28-French Blake drain was placed through the original port incision and directed posteriorly and superiorly.  A 2nd Blake drain was placed through a separate incision and placed along the diaphragm and finally, a 28-French chest tube was placed through the more anterior port incision and directed to the apex.  All were secured to skin with #1 silk sutures.  The right lung was reinflated.  The incision was closed in 3 layers.  The chest tubes were placed to suction.  The patient  was placed back in a supine position.  He was reintubated with a single- lumen endotracheal tube and then taken from the operating room to the medical intensive care unit in good condition.     Revonda Standard Roxan Hockey, M.D.     SCH/MEDQ  D:  10/31/2016  T:  11/01/2016  Job:  [id number]

## 2016-11-01 NOTE — Progress Notes (Signed)
PULMONARY / CRITICAL CARE MEDICINE   Name: LUNSFORD BENNER MRN: [id number] DOB: July 09, 1976    ADMISSION DATE:  10/27/2016  REFERRING MD:  Forestine Na   CHIEF COMPLAINT:  Respiratory failure, hypotension   Brief: 40yo male with hx bipolar disorder, chronic pain, DM, previous hx DVT no longer on anticoagulation, HTN, OSA on CPAP, medication non-compliance presented 11/26 to [facility] with several days of fever, diarrhea and progressive dyspnea.  Reportedly had a fall several days prior to admit where he struck his chest and was wearing ace-wrap on his chest on presentation.  In ER he had progressively worsening WOB, hypoxia, hypotension and progressive AMS.  He required intubation shortly after arrival to ER and started on vasopressors.  Further w/u revealed AKI with Scr 2.2, lactate 4, nonAG metabolic acidosis.  He was tx to [facility] treatment.   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  No known sick contacts, no recent travel, no hemoptysis or chest pain.   Imaging Dg Chest Port 1 View 10/30/2016 Diffuse right lung airspace disease and left base atelectasis. Moderate to large right effusion.   10/31/2016 still with right lung opacity and moderate to large effusion  11/01/2016 Low lung volumes with basilar atelectasis infiltrates. No pneumothorax. Interim improvement of right pleural effusion. Mild right chest wall subcutaneous emphysema. Lines and tubes including right chest tubes in stable position.   STUDIES:  CT chest 11/26>>>LLL and RUL pneumonia or atelectasis. Large partially loculated right pleural effusion.   2D echo 11/26>>> EF 60-65% without structural or functional abnormalities  LE doppler 11/27>>without DVT  CULTURES: BC x 2 11/26>>>coag neg staph in 1 out of 2. Urine 11/26>>>NGTD Sputum 11/26>>>Mod Group A Strep MRSA PCR positive Pleural fluid Cx 11/29>>NGTD. GS GPC in chains  ANTIBIOTICS: Vanc 11/26>>> Levaquin  11/26>>> Aztreonam 11/26>>>11/27  LINES/TUBES: ETT 11/26>>> R IJ cortis (EDP) 11/26>>>11/28 CVC RIJ 11/28>> PIVs 11/26>> Foley and OGT 11/25>> Chest tube, right 11/29>>  SIGNIFICANT EVENTS: 11/26: admitted and intubated due to AMS, acute resp failure and hypotension 11/28 Did well on minimal pressure support 11/29 VAT  SUBJECTIVE:  Intubated. sleepy but arousable.  Following commands and responds to questions by nodding  VITAL SIGNS: BP (!) 86/46   Pulse (!) 107   Temp 99.6 F (37.6 C) (Oral)   Resp 18   Ht 6\' 3"  (1.905 m)   Wt 126 kg (277 lb 12.5 oz)   SpO2 96%   BMI 34.72 kg/m   HEMODYNAMICS: Not on pressors  VENTILATOR SETTINGS: Vent Mode: PRVC FiO2 (%):  [40 %-70 %] 50 % Set Rate:  [18 bmp] 18 bmp Vt Set:  [680 mL] 680 mL PEEP:  [5 cmH20] 5 cmH20 Plateau Pressure:  [20 cmH20-25 cmH20] 23 cmH20  INTAKE / OUTPUT: I/O last 3 completed shifts: In: 6164.5 [I.V.:2782.5; Other:1700; NG/GT:615; IV Piggyback:1067] Out: [id number]:[id number]; Blood:75; Chest Tube:320]  PHYSICAL EXAMINATION: General: Sedate, on PRVC Neuro:  Arousable and interactive HEENT:  ETT in places, PERRL, MMM Cardiovascular:  RRR, Nl S1/S2, -M/R/G. Lungs:  resps even non labored on vent, coarse, diminished air sounds over lower lung fields bilaterally R>L. Chest tube in place and draining serosanguinous fluid.  Good pleural variation and no leak. Abdomen:  Round, soft, hypoactive bs  Musculoskeletal:  Warm and dry, no sig edema, chest tube in place.   LABS:  BMET  Recent Labs Lab 10/30/16 0445 10/31/16 0310 11/01/16 0400  NA 138 143 142  K 4.2 3.6 4.6  CL 102 103 106  CO2 27 30 28   BUN 22* 25* 23*  CREATININE 0.92 0.98 0.99  GLUCOSE 116* 108* 100*   Electrolytes  Recent Labs Lab 10/29/16 0535 10/30/16 0445 10/31/16 0310 11/01/16 0400  CALCIUM 7.6* 8.1* 8.0* 7.7*  MG 1.3* 3.1* 1.9 1.9  PHOS 2.4*  --  2.6 3.7   CBC  Recent Labs Lab 10/30/16 0445 10/31/16 0310  11/01/16 0400  WBC 26.8* 17.5* 30.0*  HGB 11.3* 11.6* 11.8*  HCT 34.6* 35.4* 37.1*  PLT 235 237 255   Coag's No results for input(s): APTT, INR in the last 168 hours.  Sepsis Markers  Recent Labs Lab 10/28/16 0217 10/28/16 0551 10/28/16 0940 10/28/16 1240 10/29/16 0535 10/30/16 0445  LATICACIDVEN 2.67* 2.5* 2.3*  --   --   --   PROCALCITON  --   --   --  56.52 44.02 32.27   ABG  Recent Labs Lab 10/29/16 0425 10/31/16 0359 11/01/16 1031  PHART 7.392 7.457* 7.516*  PCO2ART 39.4 44.8 43.0  PO2ART 59.3* 62.4* 64.0*   Liver Enzymes  Recent Labs Lab 10/27/16 2300 10/28/16 0615  AST 30 32  ALT 16* 18  ALKPHOS 65 55  BILITOT 0.5 1.1  ALBUMIN 2.9* 2.1*   Cardiac Enzymes  Recent Labs Lab 10/29/16 0129 10/29/16 0800 10/29/16 1329  TROPONINI 0.61* 0.23* 0.24*   Glucose  Recent Labs Lab 10/31/16 0806 10/31/16 1140 10/31/16 1952 10/31/16 2334 11/01/16 0336 11/01/16 0823  GLUCAP 96 91 107* 111* 115* 96   DISCUSSION: 40yo male with hx DM, HTN, OSA on cpap admitted 11/26 with acute hypoxic respiratory failure, sepsis.   ASSESSMENT / PLAN:  PULMONARY Acute hypoxic respiratory failure  Hx OSA on CPAP  Presumed CAP. Elevated PCT CT with LLL and RUL pneumonia or atelectasis.  Paraneumonic fluid in right chest s/p Rt VAT 11/29. 1.7L fluid out Chest tube in place and draining Exudative pleural fluid P:   SBT to extubate today SLP IS per RT protocol Ambulate Duonebs q6h Pulmicort q12 Abx as above  Daily CXR Appreciate CTS input  CARDIOVASCULAR Shock - likely septic + hypovolemic in setting diarrhea: resolved Troponin down trended LE doppler without DVT Hx HTN  Ventricular Bigeminy  Echo 11/26>> EF 60-65% without structural or functional abnormalities P:  D/Cd pressors. KVO D/Cd hydrocortisone Cards following, appreciate input  RENAL AKI - resolved Mild metabolic alkalosis resolved LA resolved I&O 4.1/-0.2/+9L P:   KVO IVF Lasix 40  mg IV twice a day x 2 doses Discontinued KCl BMET in AM  GASTROINTESTINAL Diarrhea  P:   D/C TF Pepcid   HEMATOLOGIC Leukocytosis P:  Heparin SubQ.  F/u CBC   INFECTIOUS Sepsis. Lactic acidosis improving Presumed CAP. Elevated PCT Leukocytosis: worsening but fever curve downtrending HIV non reactive Wound culture GPC in pairs P:   Abx as above -- PCN allergic  Pleural fluid cx NGTD. GS GPC in chains Levaquin/vancomycin  ENDOCRINE DM: ? meds at home P:   SSI  CBGs  NEUROLOGIC AMS - in setting sepsis, hypotension: resolving  Bipolar P:   RASS goal:0 to -1 D/C fentanyl PRN fentanyl for pain On benzo at home Hold home adderal  FAMILY  - Updates:  Wife updated bedside.  - Inter-disciplinary family meet or Palliative Care meeting due by:  12/3  The patient is critically ill with multiple organ systems failure and requires high complexity decision making for assessment and support, frequent evaluation and titration of therapies, application of advanced monitoring technologies and  extensive interpretation of multiple databases.   Critical Care Time devoted to patient care services described in this note is  35  Minutes. This time reflects time of care of this signee Dr Jennet Maduro. This critical care time does not reflect procedure time, or teaching time or supervisory time of PA/NP/Med student/Med Resident etc but could involve care discussion time.  Rush Farmer, M.D. [facility] Pulmonary/Critical Care Medicine. Pager: [phone]. After hours pager: [phone].

## 2016-11-01 NOTE — Progress Notes (Addendum)
      [address]       [place], [address]             [phone]       1 Day Post-Op Procedure(s) (LRB): VIDEO ASSISTED THORACOSCOPY (Right)  Subjective: Patient remains intubated. Briefly opened eyes when I spoke to him this am.  Objective: Vital signs in last 24 hours: Temp:  [99.1 F (37.3 C)-100.2 F (37.9 C)] 99.1 F (37.3 C) (11/30 0333) Pulse Rate:  [101-123] 103 (11/30 0700) Cardiac Rhythm: Sinus tachycardia (11/30 0400) Resp:  [16-34] 19 (11/30 0700) BP: (93-126)/(55-78) 100/59 (11/30 0700) SpO2:  [88 %-100 %] 98 % (11/30 0818) Arterial Line BP: (88-190)/(52-72) 123/56 (11/30 0700) FiO2 (%):  [40 %-70 %] 50 % (11/30 0809) Weight:  [277 lb 12.5 oz (126 kg)] 277 lb 12.5 oz (126 kg) (11/30 0200)      Intake/Output from previous day: 11/29 0701 - 11/30 0700 In: 4447.5 [I.V.:2297.5; IV Piggyback:450] Out: 4630 [Urine:4235; Blood:75; Chest Tube:320]   Physical Exam: CV: Tachycardic Pulmonary: Diminished right base and mostly clear on the left. Wound: Dressing is dry and intact.  Lab Results: CBC:  Recent Labs  10/31/16 0310 11/01/16 0400  WBC 17.5* 30.0*  HGB 11.6* 11.8*  HCT 35.4* 37.1*  PLT 237 255   BMET:   Recent Labs  10/31/16 0310 11/01/16 0400  NA 143 142  K 3.6 4.6  CL 103 106  CO2 30 28  GLUCOSE 108* 100*  BUN 25* 23*  CREATININE 0.98 0.99  CALCIUM 8.0* 7.7*    PT/INR: No results for input(s): LABPROT, INR in the last 72 hours. ABG:  INR: Will add last result for INR, ABG once components are confirmed Will add last 4 CBG results once components are confirmed  Assessment/Plan:  1. CV-s/p NSTEMI. Has had bi and tri geminy. Cardiology following. 2.  Pulmonary - Chest tubes with 320 cc of output since surgery. Chest tubes with no air leak. CXR this am shows no pneumothorax, low lung volumes, mild right chest wall subcutaneous emphysema.  Vent management per pulmonary/CCM.  3. ID-WBC 30,000. Related to SIRS.  Fever to 100.2. On Levaquin and Vancomycin for PNA. 4. Anemia-H and H stable at 11.8 and 37.1. 5. Nurse reports not given IV fluids post op. Will start this am.  ZIMMERMAN,DONIELLE MPA-C 11/01/2016,8:19 AM  Patient seen and examined, agree with above He is now extubated. Has some incisional/ chest tube pain CXR OK post decortication WBC up this AM- not unusual response after decort  Kiante Ciavarella C. Roxan Hockey, MD [facility] [phone]

## 2016-11-01 NOTE — Progress Notes (Signed)
PULMONARY / CRITICAL CARE MEDICINE   Name: Eric Hayes MRN: [id number] DOB: July 16, 1976    ADMISSION DATE:  10/27/2016  REFERRING MD:  Forestine Na   CHIEF COMPLAINT:  Respiratory failure, hypotension   Brief: 40yo male with hx bipolar disorder, chronic pain, DM, previous hx DVT no longer on anticoagulation, HTN, OSA on CPAP, medication non-compliance presented 11/26 to [facility] with several days of fever, diarrhea and progressive dyspnea.  Reportedly had a fall several days prior to admit where he struck his chest and was wearing ace-wrap on his chest on presentation.  In ER he had progressively worsening WOB, hypoxia, hypotension and progressive AMS.  He required intubation shortly after arrival to ER and started on vasopressors.  Further w/u revealed AKI with Scr 2.2, lactate 4, nonAG metabolic acidosis.  He was tx to [facility] for further treatment.   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  No known sick contacts, no recent travel, no hemoptysis or chest pain.   Imaging Dg Chest Port 1 View 10/30/2016 Diffuse right lung airspace disease and left base atelectasis. Moderate to large right effusion.   10/31/2016 still with right lung opacity and moderate to large effusion  11/01/2016 Low lung volumes with basilar atelectasis infiltrates. No pneumothorax. Interim improvement of right pleural effusion. Mild right chest wall subcutaneous emphysema. Lines and tubes including right chest tubes in stable position.   STUDIES:  CT chest 11/26>>>LLL and RUL pneumonia or atelectasis. Large partially loculated right pleural effusion.   2D echo 11/26>>> EF 60-65% without structural or functional abnormalities  LE doppler 11/27>>without DVT  CULTURES: BC x 2 11/26>>>coag neg staph in 1 out of 2. Urine 11/26>>>NGTD Sputum 11/26>>>Mod Group A Strep MRSA PCR positive Pleural fluid Cx 11/29>>NGTD. GS GPC in chains  ANTIBIOTICS: Vanc 11/26>>> Levaquin  11/26>>> Aztreonam 11/26>>>11/27  LINES/TUBES: ETT 11/26>>> R IJ cortis (EDP) 11/26>>>11/28 CVC RIJ 11/28>> PIVs 11/26>> Foley and OGT 11/25>> Chest tube, right 11/29>>  SIGNIFICANT EVENTS: 11/26: admitted and intubated due to AMS, acute resp failure and hypotension 11/28 Did well on minimal pressure support 11/29 VAT  SUBJECTIVE:  Intubated. sleepy but arousable.  Following commands and responds to questions by nodding  VITAL SIGNS: BP (!) 100/59   Pulse (!) 103   Temp 99.1 F (37.3 C) (Oral)   Resp 19   Ht 6\' 3"  (1.905 m)   Wt 126 kg (277 lb 12.5 oz)   SpO2 98%   BMI 34.72 kg/m   HEMODYNAMICS: Not on pressors  VENTILATOR SETTINGS: Vent Mode: PRVC FiO2 (%):  [40 %-70 %] 50 % Set Rate:  [18 bmp] 18 bmp Vt Set:  [680 mL] 680 mL PEEP:  [5 cmH20] 5 cmH20 Plateau Pressure:  [20 cmH20-25 cmH20] 24 cmH20  INTAKE / OUTPUT: I/O last 3 completed shifts: In: 6164.5 [I.V.:2782.5; Other:1700; NG/GT:615; IV Piggyback:1067] Out: DD:[id number]; Blood:75; Chest Tube:320]  PHYSICAL EXAMINATION: General: Sedate, on PVRVC Neuro:  Sedated but withdraw all ext to command. HEENT:  ETT in places, PERRL, MMM Cardiovascular:  RRR, Nl S1/S2, -M/R/G. Lungs:  resps even non labored on vent, coarse, diminished air sounds over lower lung fields bilaterally R>L. Chest tube in place and draining serosanguinous fluid.  Abdomen:  Round, soft, hypoactive bs  Musculoskeletal:  Warm and dry, no sig edema, chest tube in place.   LABS:  BMET  Recent Labs Lab 10/30/16 0445 10/31/16 0310 11/01/16 0400  NA 138 143 142  K 4.2 3.6 4.6  CL  102 103 106  CO2 27 30 28   BUN 22* 25* 23*  CREATININE 0.92 0.98 0.99  GLUCOSE 116* 108* 100*   Electrolytes  Recent Labs Lab 10/29/16 0535 10/30/16 0445 10/31/16 0310 11/01/16 0400  CALCIUM 7.6* 8.1* 8.0* 7.7*  MG 1.3* 3.1* 1.9 1.9  PHOS 2.4*  --  2.6 3.7   CBC  Recent Labs Lab 10/30/16 0445 10/31/16 0310 11/01/16 0400  WBC 26.8*  17.5* 30.0*  HGB 11.3* 11.6* 11.8*  HCT 34.6* 35.4* 37.1*  PLT 235 237 255   Coag's No results for input(s): APTT, INR in the last 168 hours.  Sepsis Markers  Recent Labs Lab 10/28/16 0217 10/28/16 0551 10/28/16 0940 10/28/16 1240 10/29/16 0535 10/30/16 0445  LATICACIDVEN 2.67* 2.5* 2.3*  --   --   --   PROCALCITON  --   --   --  56.52 44.02 32.27   ABG  Recent Labs Lab 10/28/16 1550 10/29/16 0425 10/31/16 0359  PHART 7.283* 7.392 7.457*  PCO2ART 44.8 39.4 44.8  PO2ART 79.5* 59.3* 62.4*   Liver Enzymes  Recent Labs Lab 10/27/16 2300 10/28/16 0615  AST 30 32  ALT 16* 18  ALKPHOS 65 55  BILITOT 0.5 1.1  ALBUMIN 2.9* 2.1*   Cardiac Enzymes  Recent Labs Lab 10/29/16 0129 10/29/16 0800 10/29/16 1329  TROPONINI 0.61* 0.23* 0.24*   Glucose  Recent Labs Lab 10/31/16 0509 10/31/16 0806 10/31/16 1140 10/31/16 1952 10/31/16 2334 11/01/16 0336  GLUCAP 170* 96 91 107* 111* 115*   DISCUSSION: 40yo male with hx DM, HTN, OSA on cpap admitted 11/26 with acute hypoxic respiratory failure, sepsis.   ASSESSMENT / PLAN:  PULMONARY Acute hypoxic respiratory failure  Hx OSA on CPAP  Presumed CAP. Elevated PCT CT with LLL and RUL pneumonia or atelectasis.  Paraneumonic fluid in right chest s/p Rt VAT 11/29. 1.7L fluid out Chest tube in place and draining Exudative pleural fluid P:   Continue PRVC. Sleepy today SAT/SBT when able Duonebs q6h Pulmicort q12 Abx as above  Daily CXR Appreciate CTS input  CARDIOVASCULAR Shock - likely septic + hypovolemic in setting diarrhea: resolved Troponin down trended LE doppler without DVT Hx HTN  Ventricular Bigeminy  Echo 11/26>> EF 60-65% without structural or functional abnormalities P:  D/Cd pressors. KVO D/Cd hydrocortisone Cards following, appreciate input  RENAL AKI - resolved Mild metabolic alkalosis resolved LA resolved I&O 4.1/-0.2/+9L P:   KVO  Lasix 40 mg IV twice a day x 2  doses Discontinued KCl BMET in AM  GASTROINTESTINAL Diarrhea  P:   Resume TF Pepcid   HEMATOLOGIC Leukocytosis P:  Heparin SubQ.  F/u CBC   INFECTIOUS Sepsis. Lactic acidosis improving Presumed CAP. Elevated PCT Leukocytosis: worsening but fever curve downtrending HIV non reactive Wound culture GPC in pairs P:   Abx as above -- PCN allergic  Pleural fluid cx NGTD. GS GPC in chains Levaquin/vancomycin  ENDOCRINE DM: ? meds at home P:   SSI  CBGs  NEUROLOGIC AMS - in setting sepsis, hypotension: resolving  Bipolar P:   RASS goal:0 to -1 On fentanyl gtt at 350 mcg this morning Will start weaning for SAT/SBT in am Fentanyl and versed as needed  On benzo at home Hold home adderal  FAMILY  - Updates:  No family at bedside, will reevaluate post op.  - Inter-disciplinary family meet or Palliative Care meeting due by:  12/3  Wendee Beavers, MD.  11/01/16 7:55 AM PGY-2  Rush Farmer, M.D. [facility] Pulmonary/Critical Care  Medicine. Pager: [phone]. After hours pager: [phone].

## 2016-11-02 ENCOUNTER — Inpatient Hospital Stay (HOSPITAL_COMMUNITY): Payer: Medicare Other

## 2016-11-02 LAB — GLUCOSE, CAPILLARY
GLUCOSE-CAPILLARY: 100 mg/dL — AB (ref 65–99)
GLUCOSE-CAPILLARY: 128 mg/dL — AB (ref 65–99)
Glucose-Capillary: 101 mg/dL — ABNORMAL HIGH (ref 65–99)
Glucose-Capillary: 116 mg/dL — ABNORMAL HIGH (ref 65–99)
Glucose-Capillary: 88 mg/dL (ref 65–99)

## 2016-11-02 LAB — BLOOD GAS, ARTERIAL
Acid-Base Excess: 2.4 mmol/L — ABNORMAL HIGH (ref 0.0–2.0)
Bicarbonate: 25.1 mmol/L (ref 20.0–28.0)
Drawn by: 129711
O2 Content: 12 L/min
O2 Saturation: 92.1 %
PCO2 ART: 30.3 mmHg — AB (ref 32.0–48.0)
PH ART: 7.528 — AB (ref 7.350–7.450)
PO2 ART: 63.7 mmHg — AB (ref 83.0–108.0)
Patient temperature: 98.6

## 2016-11-02 LAB — COMPREHENSIVE METABOLIC PANEL
ALBUMIN: 1.9 g/dL — AB (ref 3.5–5.0)
ALK PHOS: 54 U/L (ref 38–126)
ALT: 16 U/L — AB (ref 17–63)
AST: 26 U/L (ref 15–41)
Anion gap: 12 (ref 5–15)
BUN: 19 mg/dL (ref 6–20)
CALCIUM: 7.9 mg/dL — AB (ref 8.9–10.3)
CO2: 23 mmol/L (ref 22–32)
CREATININE: 1 mg/dL (ref 0.61–1.24)
Chloride: 107 mmol/L (ref 101–111)
GFR calc Af Amer: >60 mL/min (ref >60)
GFR calc non Af Amer: >60 mL/min (ref >60)
GLUCOSE: 95 mg/dL (ref 65–99)
Potassium: 3.7 mmol/L (ref 3.5–5.1)
SODIUM: 142 mmol/L (ref 135–145)
Total Bilirubin: 1.1 mg/dL (ref 0.3–1.2)
Total Protein: 5.9 g/dL — ABNORMAL LOW (ref 6.5–8.1)

## 2016-11-02 LAB — CBC
HCT: 37.7 % — ABNORMAL LOW (ref 39.0–52.0)
HEMOGLOBIN: 12 g/dL — AB (ref 13.0–17.0)
MCH: 29.1 pg (ref 26.0–34.0)
MCHC: 31.8 g/dL (ref 30.0–36.0)
MCV: 91.3 fL (ref 78.0–100.0)
Platelets: 314 10*3/uL (ref 150–400)
RBC: 4.13 MIL/uL — AB (ref 4.22–5.81)
RDW: 14.8 % (ref 11.5–15.5)
WBC: 30.8 10*3/uL — AB (ref 4.0–10.5)

## 2016-11-02 LAB — MAGNESIUM: MAGNESIUM: 2 mg/dL (ref 1.7–2.4)

## 2016-11-02 LAB — PHOSPHORUS: Phosphorus: 3.6 mg/dL (ref 2.5–4.6)

## 2016-11-02 MED ORDER — HALOPERIDOL LACTATE 5 MG/ML IJ SOLN
10.0000 mg | Freq: Once | INTRAMUSCULAR | Status: AC
  Administered 2016-11-02: 10 mg via INTRAVENOUS
  Filled 2016-11-02: qty 2

## 2016-11-02 MED ORDER — HALOPERIDOL LACTATE 5 MG/ML IJ SOLN
5.0000 mg | Freq: Once | INTRAMUSCULAR | Status: AC
  Administered 2016-11-02: 5 mg via INTRAVENOUS
  Filled 2016-11-02: qty 1

## 2016-11-02 MED ORDER — POTASSIUM CHLORIDE CRYS ER 20 MEQ PO TBCR
30.0000 meq | EXTENDED_RELEASE_TABLET | Freq: Once | ORAL | Status: AC
  Administered 2016-11-02: 30 meq via ORAL
  Filled 2016-11-02: qty 1

## 2016-11-02 NOTE — Progress Notes (Signed)
[facility] Progress Note Patient Name: Eric Hayes DOB: 1976-06-05 MRN: [id number]   Date of Service  11/02/2016  HPI/Events of Note  Agitation, pulling out lines.  Pulling off BiPAP.  Benzo IV earlier with limited response.  QTc less than 500.  eICU Interventions  Plan: 5 mg haldol IV Nurse to contact [facility] MD if no response in 30 minutes     Intervention Category Major Interventions: Delirium, psychosis, severe agitation - evaluation and management  DETERDING,ELIZABETH 11/02/2016, 12:33 AM

## 2016-11-02 NOTE — Progress Notes (Signed)
Placed pt on SAlter high flow nasal cannula due to desat. On 8Lpm sat improved to 92%.

## 2016-11-02 NOTE — Progress Notes (Signed)
Patient with increased agitation. Pulled a-line and PIV out. Will not keep nasal cannular on. Patient repositioned and mitts placed on patients hands.

## 2016-11-02 NOTE — Anesthesia Postprocedure Evaluation (Signed)
Anesthesia Post Note  Patient: Eric Hayes  Procedure(s) Performed: Procedure(s) (LRB): VIDEO ASSISTED THORACOSCOPY (Right)  Patient location during evaluation: ICU Anesthesia Type: General Level of consciousness: patient remains intubated per anesthesia plan Pain management: pain level controlled Vital Signs Assessment: post-procedure vital signs reviewed and stable Respiratory status: patient remains intubated per anesthesia plan Cardiovascular status: blood pressure returned to baseline and stable Postop Assessment: no signs of nausea or vomiting Anesthetic complications: no Comments: Patient taken to MICU with full monitors, O2 @100 % via Ambu to ETT. SpO2 97%. Sedated with propofol gtt. Report given to ICU RN.               Marica Trentham A.

## 2016-11-02 NOTE — Care Management Note (Signed)
Case Management Note Original Note initiated by Ellan Lambert 10/29/16  Patient Details  Name: Eric Hayes MRN: [id number] Date of Birth: Feb 14, 1976  Subjective/Objective:   Pt admitted on 10/27/16 with acute hypoxic respiratory failure and sepsis.  PTA, pt independent, lives with spouse.                   Action/Plan: Met with Eric Hayes at bedside; states pt independent prior to admission, but has been disabled for 6 years.   Will follow for discharge planning as pt progresses.  Hayes able to provide care at dc.    Expected Discharge Date:                  Expected Discharge Plan:  [place]  In-House Referral:  Clinical Social Work  Discharge planning Services  CM Consult  Post Acute Care Choice:    Choice offered to:     DME Arranged:    DME Agency:     HH Arranged:    [place] Agency:     Status of Service:  In process, will continue to follow  If discussed at Long Length of Stay Meetings, dates discussed:    Additional Comments:  11/02/2016 Eric Quinones, RN, BSN [phone] CM spoke with pt at bedside - pt is extubated but still confused and on HF [place].  PT recommended SNF - CSW consulted for placement dependent on progress.

## 2016-11-02 NOTE — Progress Notes (Signed)
PULMONARY / CRITICAL CARE MEDICINE   Name: Eric Hayes MRN: [id number] DOB: 1976/08/24    ADMISSION DATE:  10/27/2016  REFERRING MD:  Forestine Na   CHIEF COMPLAINT:  Respiratory failure, hypotension   Brief: 40yo male with hx bipolar disorder, chronic pain, DM, previous hx DVT no longer on anticoagulation, HTN, OSA on CPAP, medication non-compliance presented 11/26 to [facility] with several days of fever, diarrhea and progressive dyspnea.  Reportedly had a fall several days prior to admit where he struck his chest and was wearing ace-wrap on his chest on presentation.  In ER he had progressively worsening WOB, hypoxia, hypotension and progressive AMS.  He required intubation shortly after arrival to ER and started on vasopressors.  Further w/u revealed AKI with Scr 2.2, lactate 4, nonAG metabolic acidosis.  He was tx to [facility] for further treatment.   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  No known sick contacts, no recent travel, no hemoptysis or chest pain.   Imaging Dg Chest Port 1 View 10/30/2016 Diffuse right lung airspace disease and left base atelectasis. Moderate to large right effusion.   10/31/2016 still with right lung opacity and moderate to large effusion  11/01/2016 Low lung volumes with basilar atelectasis infiltrates. No pneumothorax. Interim improvement of right pleural effusion. Mild right chest wall subcutaneous emphysema. Lines and tubes including right chest tubes in stable position.   STUDIES:  CT chest 11/26>>>LLL and RUL pneumonia or atelectasis. Large partially loculated right pleural effusion.   2D echo 11/26>>> EF 60-65% without structural or functional abnormalities  LE doppler 11/27>>without DVT  CULTURES: BC x 2 11/26>>>coag neg staph in 1 out of 2. Urine 11/26>>>NGTD Sputum 11/26>>>Mod Group A Strep MRSA PCR positive Pleural fluid Cx 11/29>>NGTD. GS GPC in chains  ANTIBIOTICS: Vanc 11/26>>> Levaquin  11/26>>> Aztreonam 11/26>>>11/27  LINES/TUBES: ETT 11/26>>>11/30 R IJ cortis (EDP) 11/26>>>11/28 CVC RIJ 11/28>> PIVs 11/26>> Foley and OGT 11/25>> Chest tube, right 11/29>>  SIGNIFICANT EVENTS: 11/26: admitted and intubated due to AMS, acute resp failure and hypotension 11/28 Did well on minimal pressure support 11/29 VAT  SUBJECTIVE:  Intubated. sleepy but arousable.  Following commands and responds to questions by nodding  VITAL SIGNS: BP 132/71   Pulse (!) 130   Temp 99.2 F (37.3 C) (Oral)   Resp (!) 26   Ht 6\' 3"  (1.905 m)   Wt 264 lb 15.9 oz (120.2 kg)   SpO2 91%   BMI 33.12 kg/m   HEMODYNAMICS: Not on pressors  VENTILATOR SETTINGS:    INTAKE / OUTPUT: I/O last 3 completed shifts: In: 2049.6 [I.V.:1099.6; Other:50; IV [id number] Out: [id number] [Urine:6035; Chest Tube:350]  PHYSICAL EXAMINATION: General: awake and follows commands Neuro:  Arousable and interactive but interactive, working to breath HEENT:  PERRL, MMM Cardiovascular:  RRR, Nl S1/S2, -M/R/G. Lungs:  resps with rhonchit, coarse, diminished air sounds over lower lung fields bilaterally R>L. Chest tube in place and draining serosanguinous fluid.  Good pleural variation and no leak. Abdomen:  Round, soft, hypoactive bs  Musculoskeletal:  Warm and dry, no sig edema, chest tube in place.   LABS:  BMET  Recent Labs Lab 10/31/16 0310 11/01/16 0400 11/02/16 0600  NA 143 142 142  K 3.6 4.6 3.7  CL 103 106 107  CO2 30 28 23   BUN 25* 23* 19  CREATININE 0.98 0.99 1.00  GLUCOSE 108* 100* 95   Electrolytes  Recent Labs Lab 10/31/16 0310 11/01/16 0400 11/02/16 0600  CALCIUM 8.0* 7.7* 7.9*  MG 1.9 1.9 2.0  PHOS 2.6 3.7 3.6   CBC  Recent Labs Lab 10/31/16 0310 11/01/16 0400 11/02/16 0600  WBC 17.5* 30.0* 30.8*  HGB 11.6* 11.8* 12.0*  HCT 35.4* 37.1* 37.7*  PLT 237 255 314   Coag's No results for input(s): APTT, INR in the last 168 hours.  Sepsis Markers  Recent  Labs Lab 10/28/16 0217 10/28/16 0551 10/28/16 0940 10/28/16 1240 10/29/16 0535 10/30/16 0445  LATICACIDVEN 2.67* 2.5* 2.3*  --   --   --   PROCALCITON  --   --   --  56.52 44.02 32.27   ABG  Recent Labs Lab 10/29/16 0425 10/31/16 0359 11/01/16 1031  PHART 7.392 7.457* 7.516*  PCO2ART 39.4 44.8 43.0  PO2ART 59.3* 62.4* 64.0*   Liver Enzymes  Recent Labs Lab 10/27/16 2300 10/28/16 0615 11/01/16 0830 11/02/16 0600  AST 30 32  --  26  ALT 16* 18  --  16*  ALKPHOS 65 55  --  54  BILITOT 0.5 1.1  --  1.1  ALBUMIN 2.9* 2.1* 1.7* 1.9*   Cardiac Enzymes  Recent Labs Lab 10/29/16 0129 10/29/16 0800 10/29/16 1329  TROPONINI 0.61* 0.23* 0.24*   Glucose  Recent Labs Lab 11/01/16 1202 11/01/16 1532 11/01/16 1959 11/01/16 2335 11/02/16 0335 11/02/16 0804  GLUCAP 107* 108* 105* 90 101* 100*   I reviewed CXR myself, chest tube in good position.  DISCUSSION: 40yo male with hx DM, HTN, OSA on cpap admitted 11/26 with acute hypoxic respiratory failure, sepsis. Post vats  ASSESSMENT / PLAN:  PULMONARY Acute hypoxic respiratory failure  Hx OSA on CPAP  Presumed CAP. Elevated PCT CT with LLL and RUL pneumonia or atelectasis.  Paraneumonic fluid in right chest s/p Rt VAT 11/29. 1.7L fluid out Chest tube in place and draining Exudative pleural fluid P:   SBT to extubated SLP, will remain npo IS per RT protocol Ambulate Duonebs q6h Pulmicort q12 Abx as above  Daily CXR Appreciate CTS input  CARDIOVASCULAR Shock - likely septic + hypovolemic in setting diarrhea: resolved Troponin down trended LE doppler without DVT Hx HTN  Ventricular Bigeminy  Echo 11/26>> EF 60-65% without structural or functional abnormalities P:  D/Cd pressors. KVO D/Cd hydrocortisone Cards following, appreciate input  RENAL Lab Results  Component Value Date   CREATININE 1.00 11/02/2016   CREATININE 0.99 11/01/2016   CREATININE 0.98 10/31/2016   CREATININE 0.85 07/18/2016    CREATININE 0.78 12/20/2015   CREATININE 0.66 01/22/2014    AKI - resolved Mild metabolic alkalosis resolved LA resolved I&O 4.1/-0.2/+9L P:   KVO IVF Lasix 40 mg IV twice a day x 2 doses Discontinued KCl BMET in AM  GASTROINTESTINAL Diarrhea  P:   D/C TF Pepcid   HEMATOLOGIC Leukocytosis P:  Heparin SubQ.  F/u CBC   INFECTIOUS Sepsis. Lactic acidosis improving Presumed CAP. Elevated PCT Leukocytosis: worsening but fever curve downtrending HIV non reactive Wound culture GPC in pairs P:   Abx as above -- PCN allergic  Pleural fluid cx NGTD. GS GPC in chains Levaquin/vancomycin  ENDOCRINE CBG (last 3)   Recent Labs  11/01/16 2335 11/02/16 0335 11/02/16 0804  GLUCAP 90 101* 100*     DM: ? meds at home P:   SSI  CBGs  NEUROLOGIC AMS - in setting sepsis, hypotension: resolving  Bipolar P:   RASS goal:0 to -1 D/C fentanyl PRN fentanyl for pain On benzo at home Hold home adderal  FAMILY  -  Updates:  Wife updated bedside.  - Inter-disciplinary family meet or Palliative Care meeting due by:  12/3  Richardson Landry Minor ACNP Maryanna Shape PCCM Pager [phone] till 3 pm If no answer page [phone] 11/02/2016, 11:18 AM  Attending Note:  40 year old s/p VATS for empyema post PNA.  Patient remains confused and hypoxemic.  On exam, decreased BS on the right with drainage noted from chest tube.  I reviewed CXR myself, chest tube in good position.  Patient's mental status and respiratory status remain a concern.  Will hold in the ICU for observation given respiratory status.  Continue abx and chest tube drainage.  Replace K.  Continue diureses.  Fentanyl for pain.  Continue home dose of benzos.  Continue diureses.  AM labs ordered and increase O2 as patient is hyperventilating due to hypoxemia.  The patient is critically ill with multiple organ systems failure and requires high complexity decision making for assessment and support, frequent evaluation and titration of  therapies, application of advanced monitoring technologies and extensive interpretation of multiple databases.   Critical Care Time devoted to patient care services described in this note is  35  Minutes. This time reflects time of care of this signee Dr Jennet Maduro. This critical care time does not reflect procedure time, or teaching time or supervisory time of PA/NP/Med student/Med Resident etc but could involve care discussion time.  Rush Farmer, M.D. [facility] Pulmonary/Critical Care Medicine. Pager: [phone]. After hours pager: [phone].

## 2016-11-02 NOTE — Significant Event (Signed)
Anterior chest tube removed at the request of the unit's charge RN Sharyn Lull. Patient's RN Kieth Brightly in room to assist. Patient dangle on side of bed by myself and Orlena Sheldon, RN prior to removing anterior chest tube. Anterior CT removed without complications. New dsg applied to all CT sites. VS stable
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
## 2016-11-02 NOTE — Evaluation (Addendum)
Physical Therapy Evaluation Patient Details Name: Eric Hayes MRN: [id number] DOB: 20-Mar-1976 Today's Date: 11/02/2016   History of Present Illness  39 yo admitted to [facility] with NSTEMI and VDRF with intubation 11/26-11/30, empyema s/p VATS with CT. PMHx: bipolar, chronic back pain, DM, HTN, OSA  Clinical Impression  Pt lethargic with limited ability to follow commands and provide PLOF. Pt with decreased cognition, activity tolerance, function, mobility and impaired cardiopulmonary function who will benefit from acute therapy to maximize mobility and independence to decrease caregiver burden. Pt in bed stated need for BM with room setup for pivot to [facility] however once EOB pt able to maintain grossly 3 min then fatigued and returned to supine.   Pt HR 105-130, sats 88-92 on 12L HFNC.     Follow Up Recommendations SNF;Supervision/Assistance - 24 hour (pending pt progression and family assist)    Equipment Recommendations  Other (comment) (TBD)    Recommendations for Other Services OT consult     Precautions / Restrictions Precautions Precautions: Fall Precaution Comments: chest tube, bil mittens      Mobility  Bed Mobility Overal bed mobility: Needs Assistance Bed Mobility: Rolling;Sidelying to Sit;Sit to Supine Rolling: Mod assist Sidelying to sit: Min assist   Sit to supine: Min guard   General bed mobility comments: cues for safety and upper extremity use and positioning with assist to manage lines, elevate trunk fully from bed with HOB 20degrees. Mod assist to roll right for linen change toward CT  Transfers                 General transfer comment: Pt able to sit EOB with minguard assist due to pt impulsivity to return to supine. Pt unable to attempt standing today  Ambulation/Gait                Stairs            Wheelchair Mobility    Modified Rankin (Stroke Patients Only)       Balance Overall balance assessment: No apparent balance  deficits (not formally assessed)                                           Pertinent Vitals/Pain Pain Assessment: No/denies pain    Home Living Family/patient expects to be discharged to:: Private residence                 Additional Comments: pt initially stating he lives with his mom, RN said he has a wife then he shook his head that he lives with her. Unable to provide home setup or PLOF at this time    Prior Function Level of Independence: Independent               Hand Dominance        Extremity/Trunk Assessment   Upper Extremity Assessment: Overall WFL for tasks assessed;Difficult to assess due to impaired cognition           Lower Extremity Assessment: Difficult to assess due to impaired cognition;Overall [facility] for tasks assessed      Cervical / Trunk Assessment: Normal  Communication      Cognition Arousal/Alertness: Lethargic Behavior During Therapy: Restless Overall Cognitive Status: No family/caregiver present to determine baseline cognitive functioning Area of Impairment: Orientation;Attention   Current Attention Level: Focused           General Comments:  Pt able to state name, stating he lives with mom, would not answer other questions    General Comments      Exercises     Assessment/Plan    PT Assessment Patient needs continued PT services  PT Problem List Decreased mobility;Decreased coordination;Decreased activity tolerance;Decreased cognition;Cardiopulmonary status limiting activity;Decreased knowledge of use of DME          PT Treatment Interventions DME instruction;Therapeutic activities;Cognitive remediation;Therapeutic exercise;Gait training;Stair training;Balance training;Patient/family education;Functional mobility training    PT Goals (Current goals can be found in the Care Plan section)  Acute Rehab PT Goals PT Goal Formulation: Patient unable to participate in goal setting Time For Goal  Achievement: 11/16/16 Potential to Achieve Goals: Fair    Frequency Min 3X/week   Barriers to discharge Other (comment) unsure of caregiver or home     Co-evaluation               End of Session   Activity Tolerance: Patient limited by fatigue Patient left: in bed;with call bell/phone within reach;with nursing/sitter in room Nurse Communication: Mobility status         Time: [id number] PT Time Calculation (min) (ACUTE ONLY): 28 min   Charges:   PT Evaluation $PT Eval Moderate Complexity: 1 Procedure     PT G Codes:        Madilyne Tadlock B Nazir Hacker 11-03-16, 10:18 AM Elwyn Reach, [place]

## 2016-11-02 NOTE — Progress Notes (Addendum)
      [address]       [place] [address]             [phone]       2 Days Post-Op Procedure(s) (LRB): VIDEO ASSISTED THORACOSCOPY (Right)  Subjective: Patient extubated yesterday. Has had confusion, agitation (history of amphetamine, benzo, and opiate use)  Objective: Vital signs in last 24 hours: Temp:  [97.6 F (36.4 C)-99.6 F (37.6 C)] 97.6 F (36.4 C) (12/01 0331) Pulse Rate:  [30-127] 112 (12/01 0600) Cardiac Rhythm: Sinus tachycardia (12/01 0400) Resp:  [13-40] 23 (12/01 0600) BP: (92-142)/(50-99) 141/76 (12/01 0600) SpO2:  [88 %-100 %] 90 % (12/01 0600) Arterial Line BP: (102-153)/(46-125) 138/91 (11/30 2200) FiO2 (%):  [50 %] 50 % (11/30 0815) Weight:  [264 lb 15.9 oz (120.2 kg)] 264 lb 15.9 oz (120.2 kg) (12/01 0402)      Intake/Output from previous day: 11/30 0701 - 12/01 0700 In: 1162.1 [I.V.:562.1; IV Piggyback:550] Out: [id number] [Urine:4750; Chest Tube:180]   Physical Exam: CV: Tachycardic Pulmonary: Coarse on the right. Wound: Dressing is dry and intact.  Lab Results: CBC:  Recent Labs  11/01/16 0400 11/02/16 0600  WBC 30.0* 30.8*  HGB 11.8* 12.0*  HCT 37.1* 37.7*  PLT 255 314   BMET:   Recent Labs  11/01/16 0400 11/02/16 0600  NA 142 142  K 4.6 3.7  CL 106 107  CO2 28 23  GLUCOSE 100* 95  BUN 23* 19  CREATININE 0.99 1.00  CALCIUM 7.7* 7.9*    PT/INR: No results for input(s): LABPROT, INR in the last 72 hours. ABG:  INR: Will add last result for INR, ABG once components are confirmed Will add last 4 CBG results once components are confirmed  Assessment/Plan:  1. CV-s/p NSTEMI. Previously had had bi and tri geminy. Cardiology following. 2.  Pulmonary - Chest tubes with 180 cc of output since surgery. Chest tubes with no air leak. CXR patient is rotated to the left, low lung volumes. Will remove anterior chest tube. Encourage incentive spirometer and flutter valve. 3. ID-WBC 30,800. Related to SIRS.  Fever to 99.6. On Levaquin and Vancomycin for PNA.  Pleural fluid showed no growth last 24 hours. 4. Anemia-H and H stable at 12 and 37.7. 5. Confusion, AMS per medicine  ZIMMERMAN,DONIELLE MPA-C 11/02/2016,7:47 AM  Patient seen and examined, agree with above Mental status has deteriorated significantly from yesterday when he was calm and coherent and cooperative. Likely toxic metabolic encephalopathy, possible withdrawal issues as well Will dc anterior CT  Remo Lipps C. Roxan Hockey, MD [facility] [phone]

## 2016-11-03 ENCOUNTER — Inpatient Hospital Stay (HOSPITAL_COMMUNITY): Payer: Medicare Other

## 2016-11-03 LAB — BASIC METABOLIC PANEL
ANION GAP: 11 (ref 5–15)
BUN: 20 mg/dL (ref 6–20)
CHLORIDE: 109 mmol/L (ref 101–111)
CO2: 24 mmol/L (ref 22–32)
CREATININE: 1.04 mg/dL (ref 0.61–1.24)
Calcium: 8.1 mg/dL — ABNORMAL LOW (ref 8.9–10.3)
GFR calc non Af Amer: >60 mL/min (ref >60)
Glucose, Bld: 107 mg/dL — ABNORMAL HIGH (ref 65–99)
POTASSIUM: 3.9 mmol/L (ref 3.5–5.1)
SODIUM: 144 mmol/L (ref 135–145)

## 2016-11-03 LAB — CBC
HEMATOCRIT: 39.9 % (ref 39.0–52.0)
HEMOGLOBIN: 12.8 g/dL — AB (ref 13.0–17.0)
MCH: 29.6 pg (ref 26.0–34.0)
MCHC: 32.1 g/dL (ref 30.0–36.0)
MCV: 92.1 fL (ref 78.0–100.0)
PLATELETS: 342 10*3/uL (ref 150–400)
RBC: 4.33 MIL/uL (ref 4.22–5.81)
RDW: 14.9 % (ref 11.5–15.5)
WBC: 29.8 10*3/uL — AB (ref 4.0–10.5)

## 2016-11-03 LAB — BLOOD GAS, ARTERIAL
Acid-Base Excess: 2.6 mmol/L — ABNORMAL HIGH (ref 0.0–2.0)
Bicarbonate: 25.7 mmol/L (ref 20.0–28.0)
Drawn by: 24487
O2 CONTENT: 15 L/min
O2 SAT: 90 %
PATIENT TEMPERATURE: 100.6
PO2 ART: 63.6 mmHg — AB (ref 83.0–108.0)
pCO2 arterial: 35.3 mmHg (ref 32.0–48.0)
pH, Arterial: 7.481 — ABNORMAL HIGH (ref 7.350–7.450)

## 2016-11-03 LAB — GLUCOSE, CAPILLARY
GLUCOSE-CAPILLARY: 101 mg/dL — AB (ref 65–99)
GLUCOSE-CAPILLARY: 104 mg/dL — AB (ref 65–99)
GLUCOSE-CAPILLARY: 106 mg/dL — AB (ref 65–99)
GLUCOSE-CAPILLARY: 129 mg/dL — AB (ref 65–99)
Glucose-Capillary: 70 mg/dL (ref 65–99)

## 2016-11-03 LAB — VANCOMYCIN, TROUGH
Vancomycin Tr: 42 ug/mL (ref 15–20)
Vancomycin Tr: 25 ug/mL (ref 15–20)

## 2016-11-03 LAB — MAGNESIUM: Magnesium: 2.1 mg/dL (ref 1.7–2.4)

## 2016-11-03 LAB — PHOSPHORUS: Phosphorus: 4.5 mg/dL (ref 2.5–4.6)

## 2016-11-03 MED ORDER — SODIUM CHLORIDE 0.9 % IV SOLN
30.0000 meq | Freq: Once | INTRAVENOUS | Status: AC
  Administered 2016-11-03: 30 meq via INTRAVENOUS
  Filled 2016-11-03: qty 15

## 2016-11-03 MED ORDER — VANCOMYCIN HCL 10 G IV SOLR
1250.0000 mg | Freq: Two times a day (BID) | INTRAVENOUS | Status: DC
  Administered 2016-11-04 – 2016-11-07 (×6): 1250 mg via INTRAVENOUS
  Filled 2016-11-03 (×7): qty 1250

## 2016-11-03 MED ORDER — POTASSIUM CHLORIDE 20 MEQ/15ML (10%) PO SOLN
40.0000 meq | Freq: Three times a day (TID) | ORAL | Status: DC
  Filled 2016-11-03: qty 30

## 2016-11-03 MED ORDER — AMPHETAMINE-DEXTROAMPHETAMINE 10 MG PO TABS
20.0000 mg | ORAL_TABLET | Freq: Two times a day (BID) | ORAL | Status: DC
  Administered 2016-11-03 – 2016-11-08 (×9): 20 mg via ORAL
  Filled 2016-11-03 (×11): qty 2

## 2016-11-03 MED ORDER — FUROSEMIDE 10 MG/ML IJ SOLN
40.0000 mg | Freq: Three times a day (TID) | INTRAMUSCULAR | Status: AC
Start: 2016-11-03 — End: 2016-11-03
  Administered 2016-11-03: 40 mg via INTRAVENOUS
  Filled 2016-11-03: qty 4

## 2016-11-03 NOTE — Progress Notes (Addendum)
PULMONARY / CRITICAL CARE MEDICINE   Name: Eric Hayes MRN: [id number] DOB: [birth year]    ADMISSION DATE:  10/27/2016  REFERRING MD:  Forestine Na   CHIEF COMPLAINT:  Respiratory failure, hypotension   Brief: 40yo male with hx bipolar disorder, chronic pain, DM, previous hx DVT no longer on anticoagulation, HTN, OSA on CPAP, medication non-compliance presented 11/26 to [facility] with several days of fever, diarrhea and progressive dyspnea.  Reportedly had a fall several days prior to admit where he struck his chest and was wearing ace-wrap on his chest on presentation.  In ER he had progressively worsening WOB, hypoxia, hypotension and progressive AMS.  He required intubation shortly after arrival to ER and started on vasopressors.  Further w/u revealed AKI with Scr 2.2, lactate 4, nonAG metabolic acidosis.  He was tx to [facility] for further treatment.   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  No known sick contacts, no recent travel, no hemoptysis or chest pain.   Imaging Dg Chest Port 1 View 10/30/2016 Diffuse right lung airspace disease and left base atelectasis. Moderate to large right effusion.   10/31/2016 still with right lung opacity and moderate to large effusion  11/01/2016 Low lung volumes with basilar atelectasis infiltrates. No pneumothorax. Interim improvement of right pleural effusion. Mild right chest wall subcutaneous emphysema. Lines and tubes including right chest tubes in stable position.  STUDIES:  CT chest 11/26>>>LLL and RUL pneumonia or atelectasis. Large partially loculated right pleural effusion.   2D echo 11/26>>> EF 60-65% without structural or functional abnormalities  LE doppler 11/27>>without DVT  CULTURES: BC x 2 11/26>>>coag neg staph in 1 out of 2. Urine 11/26>>>NGTD Sputum 11/26>>>Mod Group A Strep MRSA PCR positive Pleural fluid Cx 11/29>>NGTD. GS GPC in chains  ANTIBIOTICS: Vanc 11/26>>> Levaquin  11/26>>> Aztreonam 11/26>>>11/27  LINES/TUBES: ETT 11/26>>>11/30 R IJ cortis (EDP) 11/26>>>11/28 CVC RIJ 11/28>> PIVs 11/26>> Foley and OGT 11/25>> Chest tube, right 11/29>>  SIGNIFICANT EVENTS: 11/26: admitted and intubated due to AMS, acute resp failure and hypotension 11/28 Did well on minimal pressure support 11/29 VAT  SUBJECTIVE:  Intubated. sleepy but arousable.  Following commands and responds to questions by nodding  VITAL SIGNS: BP 96/68   Pulse (!) 106   Temp 100 F (37.8 C)   Resp 14   Ht 6\' 3"  (1.905 m)   Wt 119.9 kg (264 lb 5.3 oz)   SpO2 95%   BMI 33.04 kg/m   HEMODYNAMICS: Not on pressors  VENTILATOR SETTINGS:    INTAKE / OUTPUT: I/O last 3 completed shifts: In: 2040 [I.V.:340; IV Piggyback:1700] Out: [id number] [Urine:5200; Chest Tube:110]  PHYSICAL EXAMINATION: General: Lethargic but arousable and following command Neuro:  Arousable and interactive but interactive, working to breath HEENT:  PERRL, MMM, [place]/AT. Cardiovascular:  RRR, Nl S1/S2, -M/R/G. Lungs:  resps with rhonchi, coarse, diminished air sounds over lower lung fields bilaterally R>L. Chest tube in place and draining serosanguinous fluid.  Good pleural variation and no leak. Abdomen:  Round, soft, hypoactive bs. Musculoskeletal:  Warm and dry, no sig edema, chest tube in place.   LABS:  BMET  Recent Labs Lab 11/01/16 0400 11/02/16 0600 11/03/16 0629  NA 142 142 144  K 4.6 3.7 3.9  CL 106 107 109  CO2 28 23 24   BUN 23* 19 20  CREATININE 0.99 1.00 1.04  GLUCOSE 100* 95 107*   Electrolytes  Recent Labs Lab 11/01/16 0400 11/02/16 0600 11/03/16 0629  CALCIUM 7.7*  7.9* 8.1*  MG 1.9 2.0 2.1  PHOS 3.7 3.6 4.5   CBC  Recent Labs Lab 11/01/16 0400 11/02/16 0600 11/03/16 0629  WBC 30.0* 30.8* 29.8*  HGB 11.8* 12.0* 12.8*  HCT 37.1* 37.7* 39.9  PLT 255 314 342   Coag's No results for input(s): APTT, INR in the last 168 hours.  Sepsis Markers  Recent Labs Lab  10/28/16 0217 10/28/16 0551 10/28/16 0940 10/28/16 1240 10/29/16 0535 10/30/16 0445  LATICACIDVEN 2.67* 2.5* 2.3*  --   --   --   PROCALCITON  --   --   --  56.52 44.02 32.27   ABG  Recent Labs Lab 11/01/16 1031 11/02/16 1235 11/03/16 0430  PHART 7.516* 7.528* 7.481*  PCO2ART 43.0 30.3* 35.3  PO2ART 64.0* 63.7* 63.6*   Liver Enzymes  Recent Labs Lab 10/27/16 2300 10/28/16 0615 11/01/16 0830 11/02/16 0600  AST 30 32  --  26  ALT 16* 18  --  16*  ALKPHOS 65 55  --  54  BILITOT 0.5 1.1  --  1.1  ALBUMIN 2.9* 2.1* 1.7* 1.9*   Cardiac Enzymes  Recent Labs Lab 10/29/16 0129 10/29/16 0800 10/29/16 1329  TROPONINI 0.61* 0.23* 0.24*   Glucose  Recent Labs Lab 11/02/16 0804 11/02/16 1129 11/02/16 1559 11/02/16 2325 11/03/16 0322 11/03/16 0849  GLUCAP 100* 128* 116* 88 70 101*   I reviewed CXR myself, chest tube in good position.  DISCUSSION: 40yo male with hx DM, HTN, OSA on cpap admitted 11/26 with acute hypoxic respiratory failure, sepsis. Post vats  ASSESSMENT / PLAN:  PULMONARY Acute hypoxic respiratory failure  Hx OSA on CPAP  Presumed CAP. Elevated PCT CT with LLL and RUL pneumonia or atelectasis.  Paraneumonic fluid in right chest s/p Rt VAT 11/29. 1.7L fluid out Chest tube in place and draining Exudative pleural fluid P:   SLP recommendation is NPO except for meds IS per RT protocol Ambulate when less lethargic Duonebs q6h Pulmicort q12 Abx as above  Daily CXR Appreciate CTS input  CARDIOVASCULAR Shock - likely septic + hypovolemic in setting diarrhea: resolved Troponin down trended LE doppler without DVT Hx HTN  Ventricular Bigeminy  Echo 11/26>> EF 60-65% without structural or functional abnormalities P:  D/Cd pressors. KVO D/Cd hydrocortisone Cards following, appreciate input  RENAL Lab Results  Component Value Date   CREATININE 1.04 11/03/2016   CREATININE 1.00 11/02/2016   CREATININE 0.99 11/01/2016   CREATININE  0.85 07/18/2016   CREATININE 0.78 12/20/2015   CREATININE 0.66 01/22/2014    AKI - resolved Mild metabolic alkalosis resolved LA resolved I&O 4.1/-0.2/+9L P:   KVO IVF Lasix 40 mg IV twice a day x 2 doses Replace electrolytes as indicated BMET in AM  GASTROINTESTINAL Diarrhea  P:   NPO Pepcid   HEMATOLOGIC Leukocytosis P:  Heparin SubQ.  F/u CBC   INFECTIOUS Sepsis. Lactic acidosis improving Presumed CAP. Elevated PCT Leukocytosis: worsening but fever curve downtrending HIV non reactive Wound culture GPC in pairs P:   Abx as above -- PCN allergic. Pleural fluid cx NGTD.  GS GPC in chains. Levaquin/vancomycin.  ENDOCRINE CBG (last 3)   Recent Labs  11/02/16 2325 11/03/16 0322 11/03/16 0849  GLUCAP 88 70 101*   DM: ? meds at home P:   SSI  CBGs  NEUROLOGIC AMS - in setting sepsis, hypotension: resolving  Bipolar P:   RASS goal:0 to -1 D/C fentanyl D/C morphine D/C ativan Restart home adderal  FAMILY  - Updates:  No  family bedside to update.  Hold in the ICU overnight given AMS and hypoxemic failure.  - Inter-disciplinary family meet or Palliative Care meeting due by:  12/3  The patient is critically ill with multiple organ systems failure and requires high complexity decision making for assessment and support, frequent evaluation and titration of therapies, application of advanced monitoring technologies and extensive interpretation of multiple databases.   Critical Care Time devoted to patient care services described in this note is  35  Minutes. This time reflects time of care of this signee Dr Jennet Maduro. This critical care time does not reflect procedure time, or teaching time or supervisory time of PA/NP/Med student/Med Resident etc but could involve care discussion time.  Rush Farmer, M.D. [facility] Pulmonary/Critical Care Medicine. Pager: [phone]. After hours pager: [phone].

## 2016-11-03 NOTE — Progress Notes (Signed)
Pharmacy Antibiotic Note  Eric Hayes is a 40 y.o. male admitted on 10/27/2016 with pneumonia and pleural effusion.  Pt on Levaquin and Vancomycin (Day #7).   Vancomycin trough  25 mcg/ml (supratherapeutic) on 1250mg  IV q8h. Confirmed that level drawn prior to dose given.   **RN hung next dose after trough given but before resulted so ~1/4 of bag had infused - she is now turning off**  SCr stable.  Plan: Change vancomycin to 1250mg  IV every 12 hours. Goal trough 15-20 mcg/mL.  Continue Levaquin 750mg  IV q24h (CCM dosing) Monitor culture data, renal function and clinical course VT at Css on new dose  Height: 6\' 3"  (190.5 cm) Weight: 264 lb 5.3 oz (119.9 kg) IBW/kg (Calculated) : 84.5  Temp (24hrs), Avg:99.4 F (37.4 C), Min:98.2 F (36.8 C), Max:100.7 F (38.2 C)   Recent Labs Lab 10/28/16 0217 10/28/16 0551  10/28/16 0940  10/30/16 0445  10/31/16 0310  11/01/16 0400 11/02/16 0600 11/03/16 0629 11/03/16 1526 11/03/16 2236  WBC  --   --   < >  --   < > 26.8*  --  17.5*  --  30.0* 30.8* 29.8*  --   --   CREATININE  --   --   < >  --   < > 0.92  --  0.98  --  0.99 1.00 1.04  --   --   LATICACIDVEN 2.67* 2.5*  --  2.3*  --   --   --   --   --   --   --   --   --   --   VANCOTROUGH  --   --   --   --   --   --   < >  --   < >  --   --   --  42* 25*  < > = values in this interval not displayed.  Estimated Creatinine Clearance: 131.8 mL/min (by C-G formula based on SCr of 1.04 mg/dL).    Allergies  Allergen Reactions  . Penicillins Hives and Swelling    AS A CHILD, has tolerated ceftriaxone and cephalexin in past.     Antimicrobials this admission: Azactam 11/26 >> 11/27 Levaquin 11/26 >>  Vanc 11/26 >>  Dose adjustments this admission: 11/28 Vancomycin trough 8 mcg/mL on vancomycin 1g q12hr 12/2 VT 25 mcg/ml on vanc 1250mg  IV q8h - change to 1250mg  IV q12h  Microbiology results: 11/25 BCx: 1/2 CONS 11/25 UCx: no growth  11/26 TA: Group A strep  11/26 MRSA  PCR: positive 11/29 pleural fluid: GPC on gram stain  Sherlon Handing, PharmD, BCPS Clinical pharmacist, pager [phone] 11/03/16 11:45 PM

## 2016-11-03 NOTE — Clinical Social Work Note (Signed)
CSW acknowledges SNF referral. Per CM note pt discharge plan is home with home health services. CSW will continue to follow incase need arises.  [address] [place]

## 2016-11-03 NOTE — Progress Notes (Signed)
Speech Language Pathology Treatment: Dysphagia  Patient Details Name: Eric Hayes MRN: [id number] DOB: 1976-05-05 Today's Date: 11/03/2016 Time: [id number] SLP Time Calculation (min) (ACUTE ONLY): 15 min  Assessment / Plan / Recommendation Clinical Impression  ST follow up for PO trials following FEES study yesterday.  Per nursing the patient appeared to be tolerating ice chips well.  Oral care was provided.  The patient was trialed on ice chips, thin liquids via tsp and cup sips of thin (i.e. 3 ounce water challenge).  Increase wheezing was noted following trials of ice chips and thin liquids via tsp sips.  Given the 3 ounces of water the patient was unable to continuously take all the liquid until he was done and had a very strong cough response about mid completion.  Given this and his history recommend that he remain NPO except ice chips with nursing only following oral care and meds crushed in purees.  Patient should continue to receive aggressive oral care.  ST will continue to follow for PO readiness.     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 Reportedly had a fall several days prior to admit where he struck his chest and was wearing ace-wrap on his chest on presentation. In ER he had progressively worsening WOB, hypoxia, hypotension and progressive AMS. He required intubation shortly after arrival to ER and started on vasopressors. Further w/u revealed AKI with Scr 2.2, lactate 4, nonAG metabolic acidosis.CXR 11/28 showed diffuse right lung airspace disease and left base atelectasis. Intubated 11/26. Extubated 11/30      SLP Plan  Continue with current plan of care     Recommendations  Diet recommendations: NPO Medication Administration: Crushed with puree Postural  Changes and/or Swallow Maneuvers: Seated upright 90 degrees                Oral Care Recommendations: Oral care QID Follow up Recommendations: Inpatient Rehab Plan: Continue with current plan of care       [place], MA, CCC-SLP Acute Rehab SLP [phone]  Lamar Sprinkles 11/03/2016, 2:39 PM

## 2016-11-03 NOTE — Progress Notes (Signed)
MEDICATION RELATED CONSULT NOTE  Vancomycin trough drawn this afternoon was drawn while vancomycin was infusing so level is invalid. I have already scheduled another level to be drawn prior to the next dose. RN has been informed.   Abdullahi Vallone, Rande Lawman 11/03/2016,4:36 PM

## 2016-11-03 NOTE — Progress Notes (Signed)
3 Days Post-Op Procedure(s) (LRB): VIDEO ASSISTED THORACOSCOPY (Right) Subjective: Still some confusion but less combative  Objective: Vital signs in last 24 hours: Temp:  [98.6 F (37 C)-100.7 F (38.2 C)] 100 F (37.8 C) (12/02 0900) Pulse Rate:  [38-123] 106 (12/02 0900) Cardiac Rhythm: Sinus tachycardia (12/02 0800) Resp:  [14-34] 14 (12/02 0900) BP: (96-132)/(56-106) 96/68 (12/02 0900) SpO2:  [85 %-98 %] 95 % (12/02 0900) Weight:  [264 lb 5.3 oz (119.9 kg)] 264 lb 5.3 oz (119.9 kg) (12/02 0500)  Hemodynamic parameters for last 24 hours:    Intake/Output from previous day: 12/01 0701 - 12/02 0700 In: 1380 [I.V.:230; IV Piggyback:1150] Out: 2555 [Urine:2525; Chest Tube:30] Intake/Output this shift: Total I/O In: 50 [IV Piggyback:50] Out: 1150 [Urine:1150]  General appearance: cooperative and slowed mentation Lungs: diminished breath sounds bibasilar no air leak, serous fluid draining  Lab Results:  Recent Labs  11/02/16 0600 11/03/16 0629  WBC 30.8* 29.8*  HGB 12.0* 12.8*  HCT 37.7* 39.9  PLT 314 342   BMET:  Recent Labs  11/02/16 0600 11/03/16 0629  NA 142 144  K 3.7 3.9  CL 107 109  CO2 23 24  GLUCOSE 95 107*  BUN 19 20  CREATININE 1.00 1.04  CALCIUM 7.9* 8.1*    PT/INR: No results for input(s): LABPROT, INR in the last 72 hours. ABG    Component Value Date/Time   PHART 7.481 (H) 11/03/2016 0430   HCO3 25.7 11/03/2016 0430   TCO2 36 11/01/2016 1031   ACIDBASEDEF 0.8 10/29/2016 0425   O2SAT 90.0 11/03/2016 0430   CBG (last 3)   Recent Labs  11/02/16 2325 11/03/16 0322 11/03/16 0849  GLUCAP 88 70 101*    Assessment/Plan: S/P Procedure(s) (LRB): VIDEO ASSISTED THORACOSCOPY (Right) -Remains on high flow nasal cannula, 12 L -still has dense consolidation in RLL on CXR - serous drainage from chest tubes- leave for now -consider bronch if oxygenation does not improve   LOS: 6 days    Melrose Nakayama 11/03/2016

## 2016-11-04 ENCOUNTER — Inpatient Hospital Stay (HOSPITAL_COMMUNITY): Payer: Medicare Other

## 2016-11-04 LAB — GLUCOSE, CAPILLARY
GLUCOSE-CAPILLARY: 85 mg/dL (ref 65–99)
GLUCOSE-CAPILLARY: 92 mg/dL (ref 65–99)
GLUCOSE-CAPILLARY: 92 mg/dL (ref 65–99)
Glucose-Capillary: 100 mg/dL — ABNORMAL HIGH (ref 65–99)
Glucose-Capillary: 109 mg/dL — ABNORMAL HIGH (ref 65–99)
Glucose-Capillary: 85 mg/dL (ref 65–99)
Glucose-Capillary: 91 mg/dL (ref 65–99)

## 2016-11-04 LAB — CBC
HCT: 39.2 % (ref 39.0–52.0)
Hemoglobin: 12.7 g/dL — ABNORMAL LOW (ref 13.0–17.0)
MCH: 30 pg (ref 26.0–34.0)
MCHC: 32.4 g/dL (ref 30.0–36.0)
MCV: 92.7 fL (ref 78.0–100.0)
PLATELETS: 359 10*3/uL (ref 150–400)
RBC: 4.23 MIL/uL (ref 4.22–5.81)
RDW: 15 % (ref 11.5–15.5)
WBC: 25.7 10*3/uL — AB (ref 4.0–10.5)

## 2016-11-04 LAB — BASIC METABOLIC PANEL
Anion gap: 10 (ref 5–15)
BUN: 21 mg/dL — AB (ref 6–20)
CO2: 24 mmol/L (ref 22–32)
Calcium: 8 mg/dL — ABNORMAL LOW (ref 8.9–10.3)
Chloride: 109 mmol/L (ref 101–111)
Creatinine, Ser: 1.09 mg/dL (ref 0.61–1.24)
Glucose, Bld: 98 mg/dL (ref 65–99)
POTASSIUM: 3.7 mmol/L (ref 3.5–5.1)
SODIUM: 143 mmol/L (ref 135–145)

## 2016-11-04 LAB — PHOSPHORUS: Phosphorus: 4.2 mg/dL (ref 2.5–4.6)

## 2016-11-04 LAB — MAGNESIUM: MAGNESIUM: 2.3 mg/dL (ref 1.7–2.4)

## 2016-11-04 MED ORDER — FUROSEMIDE 10 MG/ML IJ SOLN
40.0000 mg | Freq: Three times a day (TID) | INTRAMUSCULAR | Status: AC
  Administered 2016-11-04 (×2): 40 mg via INTRAVENOUS
  Filled 2016-11-04 (×3): qty 4

## 2016-11-04 MED ORDER — POTASSIUM CHLORIDE CRYS ER 20 MEQ PO TBCR
40.0000 meq | EXTENDED_RELEASE_TABLET | Freq: Once | ORAL | Status: AC
  Administered 2016-11-04: 40 meq via ORAL
  Filled 2016-11-04: qty 2

## 2016-11-04 NOTE — Progress Notes (Signed)
Pt requesting cpap to take a nap. Placed cpap on w/ 6 lpm oxygen bled in .  Sat 94%, pt tol well, no distress noted.  Pt states he feels that he is "getting enough air" currently.

## 2016-11-04 NOTE — Progress Notes (Signed)
      [address]       [place] [address]             [phone]      4 Days Post-Op Procedure(s) (LRB): VIDEO ASSISTED THORACOSCOPY (Right) Subjective: Happy to see his family this morning. No issues overnight.   Objective: Vital signs in last 24 hours: Temp:  [98.2 F (36.8 C)-100.3 F (37.9 C)] 98.5 F (36.9 C) (12/03 0838) Pulse Rate:  [94-114] 98 (12/03 0800) Cardiac Rhythm: Sinus tachycardia (12/03 0800) Resp:  [14-27] 17 (12/03 0800) BP: (86-112)/(48-76) 102/64 (12/03 0800) SpO2:  [89 %-97 %] 95 % (12/03 0800) Weight:  [257 lb 4.4 oz (116.7 kg)] 257 lb 4.4 oz (116.7 kg) (12/03 0323)     Intake/Output from previous day: 12/02 0701 - 12/03 0700 In: 1265 [I.V.:250; IV Piggyback:1015] Out: 3980 [Urine:3705; Chest Tube:275] Intake/Output this shift: Total I/O In: 20 [I.V.:20] Out: 175 [Chest Tube:175]  General appearance: cooperative and no distress Heart: regular rate and rhythm Lungs: rhonchi bilaterally Abdomen: soft, non-tender; bowel sounds normal; no masses,  no organomegaly Extremities: extremities normal, atraumatic, no cyanosis or edema Wound: clean and dry  Lab Results:  Recent Labs  11/03/16 0629 11/04/16 0311  WBC 29.8* 25.7*  HGB 12.8* 12.7*  HCT 39.9 39.2  PLT 342 359   BMET:  Recent Labs  11/03/16 0629 11/04/16 0311  NA 144 143  K 3.9 3.7  CL 109 109  CO2 24 24  GLUCOSE 107* 98  BUN 20 21*  CREATININE 1.04 1.09  CALCIUM 8.1* 8.0*    PT/INR: No results for input(s): LABPROT, INR in the last 72 hours. ABG    Component Value Date/Time   PHART 7.481 (H) 11/03/2016 0430   HCO3 25.7 11/03/2016 0430   TCO2 36 11/01/2016 1031   ACIDBASEDEF 0.8 10/29/2016 0425   O2SAT 90.0 11/03/2016 0430   CBG (last 3)   Recent Labs  11/03/16 2355 11/04/16 0357 11/04/16 0840  GLUCAP 85 109* 100*    Assessment/Plan: S/P Procedure(s) (LRB): VIDEO ASSISTED THORACOSCOPY (Right)   -remains on high flow oxygen, however  now 8L compared to 12L yesterday.  -chest tubes put out 285ml/24 hours. Will continue. Place on water seal.  -CXR today shows: Some associated areas of atelectasis and/or consolidation are noted in the right lung base, and to a lesser extent in the medial aspect of the left lower lobe. Stable exam. -H and H stable at 12.7/39.2     LOS: 7 days    Elgie Collard 11/04/2016

## 2016-11-04 NOTE — Progress Notes (Signed)
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  Reportedly had a fall several days prior to admit where he struck his chest and was wearing ace-wrap on his chest on presentation.  In ER he had progressively worsening WOB, hypoxia, hypotension and progressive AMS.  He required intubation shortly after arrival to ER and started on vasopressors.  Further w/u revealed AKI with Scr 2.2, lactate 4, nonAG metabolic acidosis.  He was tx to [facility] for further treatment.   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  No known sick contacts, no recent travel, no hemoptysis or chest pain.   Imaging Dg Chest Port 1 View 10/30/2016 Diffuse right lung airspace disease and left base atelectasis. Moderate to large right effusion.   10/31/2016 still with right lung opacity and moderate to large effusion  11/01/2016 Low lung volumes with basilar atelectasis infiltrates. No pneumothorax. Interim improvement of right pleural effusion. Mild right chest wall subcutaneous emphysema. Lines and tubes including right chest tubes in stable position.  STUDIES:  CT chest 11/26>>>LLL and RUL pneumonia or atelectasis. Large partially loculated right pleural effusion.   2D echo 11/26>>> EF 60-65% without structural or functional abnormalities  LE doppler 11/27>>without DVT  CULTURES: BC x 2 11/26>>>coag neg staph in 1 out of 2. Urine 11/26>>>NGTD Sputum 11/26>>>Mod Group A Strep MRSA PCR positive Pleural fluid Cx 11/29>>NGTD. GS GPC in chains  ANTIBIOTICS: Vanc 11/26>>> Levaquin  11/26>>>12/1 Aztreonam 11/26>>>11/27  LINES/TUBES: ETT 11/26>>>11/30 R IJ cortis (EDP) 11/26>>>11/28 CVC RIJ 11/28>> PIVs 11/26>> Foley and OGT 11/25>> Chest tube, right 11/29>>  SIGNIFICANT EVENTS: 11/26: admitted and intubated due to AMS, acute resp failure and hypotension 11/28 Did well on minimal pressure support 11/29 VAT  SUBJECTIVE:  No events overnight, following commands and pain is better  VITAL SIGNS: BP 102/64   Pulse 98   Temp 98.5 F (36.9 C) (Oral)   Resp 17   Ht 6\' 3"  (1.905 m)   Wt 116.7 kg (257 lb 4.4 oz)   SpO2 95%   BMI 32.16 kg/m   HEMODYNAMICS: Not on pressors  VENTILATOR SETTINGS:    INTAKE / OUTPUT: I/O last 3 completed shifts: In: 2215 [I.V.:350; IV Piggyback:1865] Out: [id number] [Urine:5355; Chest Tube:305]  PHYSICAL EXAMINATION: General: Much more alert and interactive this AM Neuro:  Arousable and interactive, moving all ext to command HEENT:  PERRL, MMM, [place]/AT. Cardiovascular:  RRR, Nl S1/S2, -M/R/G. Lungs:  resps with rhonchi, coarse, diminished air sounds over lower lung fields bilaterally R>L. Chest tube in place and draining serosanguinous fluid.  Good pleural variation and no leak. Abdomen:  Round, soft, hypoactive bs. Musculoskeletal:  Warm and dry, no sig edema, chest tube in place.   LABS:  BMET  Recent Labs Lab 11/02/16 0600 11/03/16 0629 11/04/16 0311  NA 142 144 143  K 3.7 3.9 3.7  CL 107 109 109  CO2 23 24 24   BUN 19 20 21*  CREATININE 1.00 1.04 1.09  GLUCOSE 95 107* 98   Electrolytes  Recent Labs Lab 11/02/16 0600 11/03/16 0629 11/04/16 0311  CALCIUM 7.9* 8.1* 8.0*  MG 2.0 2.1 2.3  PHOS 3.6 4.5 4.2   CBC  Recent Labs Lab 11/02/16 0600 11/03/16 0629 11/04/16 0311  WBC 30.8* 29.8* 25.7*  HGB 12.0* 12.8* 12.7*  HCT 37.7* 39.9 39.2  PLT 314 342 359   Coag's No results for input(s): APTT, INR in the last 168 hours.  Sepsis Markers  Recent Labs Lab 10/28/16 1240 10/29/16 0535  10/30/16 0445  PROCALCITON 56.52 44.02 32.27   ABG  Recent Labs Lab 11/01/16 1031 11/02/16 1235 11/03/16 0430  PHART 7.516* 7.528* 7.481*  PCO2ART 43.0 30.3* 35.3  PO2ART 64.0* 63.7* 63.6*   Liver Enzymes  Recent Labs Lab 11/01/16 0830 11/02/16 0600  AST  --  26  ALT  --  16*  ALKPHOS  --  54  BILITOT  --  1.1  ALBUMIN 1.7* 1.9*   Cardiac Enzymes  Recent Labs Lab 10/29/16 0129 10/29/16 0800 10/29/16 1329  TROPONINI 0.61* 0.23* 0.24*   Glucose  Recent Labs Lab 11/03/16 1259 11/03/16 1622 11/03/16 1943 11/03/16 2355 11/04/16 0357 11/04/16 0840  GLUCAP 104* 106* 129* 85 109* 100*   I reviewed CXR myself, chest tube in good position.  DISCUSSION: 40yo male with hx DM, HTN, OSA on cpap admitted 11/26 with acute hypoxic respiratory failure, sepsis. Post vats  ASSESSMENT / PLAN:  PULMONARY Acute hypoxic respiratory failure  Hx OSA on CPAP  Presumed CAP. Elevated PCT CT with LLL and RUL pneumonia or atelectasis.  Paraneumonic fluid in right chest s/p Rt VAT 11/29. 1.7L fluid out Chest tube in place and draining Exudative pleural fluid P:   SLP recommendation is NPO except for meds, repeat SLP IS per RT protocol Much more alert and interactive Duonebs q6h Pulmicort q12 Abx as above  Daily CXR Appreciate CTS input, will manage CT  CARDIOVASCULAR Shock - likely septic + hypovolemic in setting diarrhea: resolved Troponin down trended LE doppler without DVT Hx HTN  Ventricular Bigeminy  Echo 11/26>> EF 60-65% without structural or functional abnormalities P:  D/Cd pressors. KVO D/Ced hydrocortisone Cards following, appreciate input  RENAL Lab Results  Component Value Date   CREATININE 1.09 11/04/2016   CREATININE 1.04 11/03/2016   CREATININE 1.00 11/02/2016   CREATININE 0.85 07/18/2016   CREATININE 0.78 12/20/2015   CREATININE 0.66 01/22/2014   AKI - resolved Mild metabolic alkalosis resolved LA resolved I&O 4.1/-0.2/+9L P:   KVO  IVF Lasix 40 mg IV q8 x2 doses Replace electrolytes as indicated BMET in AM  GASTROINTESTINAL Diarrhea  P:   NPO Pepcid  Diet per SLP  HEMATOLOGIC Leukocytosis P:  Heparin SubQ.  F/u CBC   INFECTIOUS Sepsis. Lactic acidosis improving Presumed CAP. Elevated PCT Leukocytosis: worsening but fever curve downtrending HIV non reactive Wound culture GPC in pairs P:   Abx as above -- PCN allergic. Pleural fluid cx NGTD.  GS GPC in chains. Levaquin off Vancomycin with stop date in place  ENDOCRINE CBG (last 3)   Recent Labs  11/03/16 2355 11/04/16 0357 11/04/16 0840  GLUCAP 85 109* 100*   DM: ? meds at home P:   SSI  CBGs  NEUROLOGIC AMS - in setting sepsis, hypotension: resolving  Bipolar P:   RASS goal:0 to -1 D/C fentanyl D/C morphine D/C ativan Restarted home adderal  FAMILY  - Updates:  No family bedside to update.  - Inter-disciplinary family meet or Palliative Care meeting due by:  12/3  Transfer to tele and to [facility] service with PCCM off 12/4.  Discussed with TRH-MD.  Rush Farmer, M.D. [facility] Pulmonary/Critical Care Medicine. Pager: [phone]. After hours pager: [phone].

## 2016-11-05 ENCOUNTER — Inpatient Hospital Stay (HOSPITAL_COMMUNITY): Payer: Medicare Other

## 2016-11-05 DIAGNOSIS — J181 Lobar pneumonia, unspecified organism: Secondary | ICD-10-CM

## 2016-11-05 DIAGNOSIS — F17210 Nicotine dependence, cigarettes, uncomplicated: Secondary | ICD-10-CM

## 2016-11-05 DIAGNOSIS — Z880 Allergy status to penicillin: Secondary | ICD-10-CM

## 2016-11-05 DIAGNOSIS — D72829 Elevated white blood cell count, unspecified: Secondary | ICD-10-CM

## 2016-11-05 DIAGNOSIS — B957 Other staphylococcus as the cause of diseases classified elsewhere: Secondary | ICD-10-CM

## 2016-11-05 DIAGNOSIS — Z9049 Acquired absence of other specified parts of digestive tract: Secondary | ICD-10-CM

## 2016-11-05 DIAGNOSIS — Z9181 History of falling: Secondary | ICD-10-CM

## 2016-11-05 DIAGNOSIS — Z8249 Family history of ischemic heart disease and other diseases of the circulatory system: Secondary | ICD-10-CM

## 2016-11-05 DIAGNOSIS — Z833 Family history of diabetes mellitus: Secondary | ICD-10-CM

## 2016-11-05 LAB — COMPREHENSIVE METABOLIC PANEL WITH GFR
ALT: 20 U/L (ref 17–63)
AST: 28 U/L (ref 15–41)
Albumin: 1.9 g/dL — ABNORMAL LOW (ref 3.5–5.0)
Alkaline Phosphatase: 50 U/L (ref 38–126)
Anion gap: 7 (ref 5–15)
BUN: 15 mg/dL (ref 6–20)
CO2: 22 mmol/L (ref 22–32)
Calcium: 7.9 mg/dL — ABNORMAL LOW (ref 8.9–10.3)
Chloride: 108 mmol/L (ref 101–111)
Creatinine, Ser: 1 mg/dL (ref 0.61–1.24)
GFR calc Af Amer: >60 mL/min
GFR calc non Af Amer: >60 mL/min
Glucose, Bld: 105 mg/dL — ABNORMAL HIGH (ref 65–99)
Potassium: 3.7 mmol/L (ref 3.5–5.1)
Sodium: 137 mmol/L (ref 135–145)
Total Bilirubin: 0.5 mg/dL (ref 0.3–1.2)
Total Protein: 7.3 g/dL (ref 6.5–8.1)

## 2016-11-05 LAB — BASIC METABOLIC PANEL
ANION GAP: 9 (ref 5–15)
BUN: 18 mg/dL (ref 6–20)
CO2: 21 mmol/L — ABNORMAL LOW (ref 22–32)
Calcium: 8.2 mg/dL — ABNORMAL LOW (ref 8.9–10.3)
Chloride: 107 mmol/L (ref 101–111)
Creatinine, Ser: 1.02 mg/dL (ref 0.61–1.24)
GFR calc Af Amer: >60 mL/min (ref >60)
Glucose, Bld: 88 mg/dL (ref 65–99)
POTASSIUM: 4 mmol/L (ref 3.5–5.1)
SODIUM: 137 mmol/L (ref 135–145)

## 2016-11-05 LAB — AEROBIC/ANAEROBIC CULTURE W GRAM STAIN (SURGICAL/DEEP WOUND)

## 2016-11-05 LAB — GLUCOSE, CAPILLARY
GLUCOSE-CAPILLARY: 135 mg/dL — AB (ref 65–99)
Glucose-Capillary: 95 mg/dL (ref 65–99)
Glucose-Capillary: 79 mg/dL (ref 65–99)
Glucose-Capillary: 95 mg/dL (ref 65–99)
Glucose-Capillary: 85 mg/dL (ref 65–99)
Glucose-Capillary: 98 mg/dL (ref 65–99)
Glucose-Capillary: 143 mg/dL — ABNORMAL HIGH (ref 65–99)

## 2016-11-05 LAB — CBC
HCT: 40.5 % (ref 39.0–52.0)
Hemoglobin: 12.8 g/dL — ABNORMAL LOW (ref 13.0–17.0)
MCH: 29.2 pg (ref 26.0–34.0)
MCHC: 31.6 g/dL (ref 30.0–36.0)
MCV: 92.3 fL (ref 78.0–100.0)
Platelets: 416 10*3/uL — ABNORMAL HIGH (ref 150–400)
RBC: 4.39 MIL/uL (ref 4.22–5.81)
RDW: 14.5 % (ref 11.5–15.5)
WBC: 28.6 10*3/uL — ABNORMAL HIGH (ref 4.0–10.5)

## 2016-11-05 LAB — MAGNESIUM: MAGNESIUM: 2.2 mg/dL (ref 1.7–2.4)

## 2016-11-05 LAB — CULTURE, BODY FLUID W GRAM STAIN -BOTTLE: Culture: NO GROWTH

## 2016-11-05 LAB — PHOSPHORUS: Phosphorus: 4.1 mg/dL (ref 2.5–4.6)

## 2016-11-05 LAB — CULTURE, BODY FLUID-BOTTLE

## 2016-11-05 LAB — AEROBIC/ANAEROBIC CULTURE (SURGICAL/DEEP WOUND)

## 2016-11-05 MED ORDER — RESOURCE THICKENUP CLEAR PO POWD
ORAL | Status: DC | PRN
  Filled 2016-11-05: qty 125

## 2016-11-05 MED ORDER — LEVALBUTEROL HCL 0.63 MG/3ML IN NEBU
0.6300 mg | INHALATION_SOLUTION | Freq: Three times a day (TID) | RESPIRATORY_TRACT | Status: DC
  Administered 2016-11-06 – 2016-11-08 (×7): 0.63 mg via RESPIRATORY_TRACT
  Filled 2016-11-05 (×9): qty 3

## 2016-11-05 MED ORDER — DEXTROSE 50 % IV SOLN
INTRAVENOUS | Status: AC
  Administered 2016-11-05: 20 mL
  Filled 2016-11-05: qty 50

## 2016-11-05 NOTE — Progress Notes (Signed)
PROGRESS NOTE                                                                                                                                                                                                             Patient Demographics:    Eric Hayes, is a 40 y.o. male, DOB - 22-Sep-1976, OZH:[id number]  Admit date - 10/27/2016   Admitting Physician Rush Landmark, MD  Outpatient Primary MD for the patient is TYSINGER, DAVID Audelia Acton, PA-C  LOS - 8  Chief Complaint  Patient presents with  . 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  Reportedly had a fall several days prior to admit where he struck his chest and was wearing ace-wrap on his chest on presentation.  In ER he had progressively worsening WOB, hypoxia, hypotension and progressive AMS.  He required intubation shortly after arrival to ER and started on vasopressors.  Further w/u revealed AKI with Scr 2.2, lactate 4, nonAG metabolic acidosis.    He was diagnosed with pneumonia with right-sided loculated effusion, this likely was parapneumonic, he was seen by pulmonary critical care along with cardiothoracic surgery underwent right-sided chest tube placement, was stabilized and transferred to my care on 11/05/2016. He still has significant leukocytosis and he is currently nothing by mouth due to dysphagia.   Subjective:    Eric Hayes today has, No headache, No chest pain, No abdominal pain - No Nausea, No new weakness tingling or numbness, No Cough - SOB.    Assessment  & Plan :     1.Acute hypoxic respiratory failure due to community-acquired pneumonia complicated by right-sided parapneumonic effusion requiring  chest U placement by cardiothoracic surgery on 10/31/2016 after a VAT procedure.  Cultures noted, he is currently on vancomycin, significant leukocytosis but appears nontoxic, will discuss with ID and narrow antibiotic spectrum. Blood culture appears to be contamination.  2. Septic shock up her admission. Had resolved.  3. Toxic encephalopathy. Resolved.  4. Dysphagia due to #1 and #3. NPO, Speech following, MBS on 11-05-16.  5. Constipation. One BM after stool softener.  6.  History  of gout. We'll start allopurinol once he is taking oral medications.  7. COPD. Stable no acute issues. Supportive care.  8. Leukocytosis. Likely due to #1. Monitor trend. No diarrhea. No other source of infection.  9. ?? DM2 - none  Lab Results  Component Value Date   HGBA1C 5.5 07/18/2016      Family Communication  :  None  Code Status :  Full  Diet : Diet NPO time specified   Disposition Plan  :  TBD  Consults  :    PCCM, CVTS  Procedures  :   Dg Chest Port 1 View 10/30/2016 Diffuse right lung airspace disease and left base atelectasis. Moderate to large right effusion.   10/31/2016 still with right lung opacity and moderate to large effusion  11/01/2016 Low lung volumes with basilar atelectasis infiltrates. No pneumothorax. Interim improvement of right pleural effusion. Mild right chest wall subcutaneous emphysema. Lines and tubes including right chest tubes in stable position.  STUDIES:   CT chest 11/26>>>LLL and RUL pneumonia or atelectasis. Large partially loculated right pleural effusion.   2D echo 11/26>>> EF 60-65% without structural or functional abnormalities  LE doppler 11/27>>without DVT  CULTURES: BC x 2 11/26>>>coag neg staph in 1 out of 2. Urine 11/26>>>NGTD Sputum 11/26>>>Mod Group A Strep MRSA PCR positive Pleural fluid Cx 11/29>>NGTD. GS GPC in chains   LINES/TUBES: ETT 11/26>>>11/30 R IJ cortis (EDP) 11/26>>>11/28 CVC RIJ 11/28>> PIVs  11/26>> Foley and OGT 11/25>> 12/4 Chest tube, right 11/29>>  SIGNIFICANT EVENTS: 11/26: admitted and intubated due to AMS, acute resp failure and hypotension 11/28 Did well on minimal pressure support 11/29 VAT  DVT Prophylaxis  :    Heparin   Lab Results  Component Value Date   PLT 416 (H) 11/05/2016    Inpatient Medications  Scheduled Meds: . acetaminophen  1,000 mg Oral Q6H   Or  . acetaminophen (TYLENOL) oral liquid 160 mg/5 mL  1,000 mg Oral Q6H  . amphetamine-dextroamphetamine  20 mg Oral BID  . bisacodyl  10 mg Oral Daily  . budesonide (PULMICORT) nebulizer solution  0.5 mg Nebulization BID  . chlorhexidine gluconate (MEDLINE KIT)  15 mL Mouth Rinse BID  . docusate  100 mg Per Tube Once  . famotidine (PEPCID) IV  20 mg Intravenous Q12H  . heparin subcutaneous  5,000 Units Subcutaneous Q8H  . insulin aspart  0-15 Units Subcutaneous Q4H  . levalbuterol  0.63 mg Nebulization Q6H  . magnesium hydroxide  30 mL Per Tube Once  . mouth rinse  15 mL Mouth Rinse QID  . senna-docusate  1 tablet Oral QHS  . vancomycin  1,250 mg Intravenous Q12H   Continuous Infusions: . sodium chloride 10 mL/hr at 11/04/16 1616   PRN Meds:.acetaminophen, ondansetron (ZOFRAN) IV, oxyCODONE  Antibiotics  :    Anti-infectives    Start     Dose/Rate Route Frequency Ordered Stop   11/04/16 1600  vancomycin (VANCOCIN) 1,250 mg in sodium chloride 0.9 % 250 mL IVPB     1,250 mg 166.7 mL/hr over 90 Minutes Intravenous Every 12 hours 11/03/16 2351     10/30/16 1500  vancomycin (VANCOCIN) 1,250 mg in sodium chloride 0.9 % 250 mL IVPB  Status:  Discontinued     1,250 mg 166.7 mL/hr over 90 Minutes Intravenous Every 8 hours 10/30/16 1410 11/03/16 2351   10/29/16 0100  levofloxacin (LEVAQUIN) IVPB 750 mg     750 mg 100 mL/hr over 90 Minutes Intravenous Every 24 hours 10/28/16  1024 11/04/16 0237   10/29/16 0000  levofloxacin (LEVAQUIN) IVPB 750 mg  Status:  Discontinued     750 mg 100 mL/hr over  90 Minutes Intravenous Every 24 hours 10/28/16 0948 10/28/16 1024   10/28/16 1030  vancomycin (VANCOCIN) IVPB 1000 mg/200 mL premix  Status:  Discontinued     1,000 mg 200 mL/hr over 60 Minutes Intravenous Every 12 hours 10/28/16 1024 10/30/16 1410   10/28/16 0045  aztreonam (AZACTAM) 1 g in dextrose 5 % 50 mL IVPB  Status:  Discontinued     1 g 100 mL/hr over 30 Minutes Intravenous Every 8 hours 10/28/16 0029 10/29/16 1256   10/28/16 0015  levofloxacin (LEVAQUIN) IVPB 750 mg     750 mg 100 mL/hr over 90 Minutes Intravenous  Once 10/28/16 0004 10/28/16 0240   10/28/16 0000  vancomycin (VANCOCIN) IVPB 1000 mg/200 mL premix     1,000 mg 200 mL/hr over 60 Minutes Intravenous  Once 10/27/16 2350 10/28/16 0109         Objective:   Vitals:   11/04/16 2150 11/05/16 0338 11/05/16 0739 11/05/16 1129  BP:  126/65  136/75  Pulse: 94 100  (!) 106  Resp: _0 Temp:  98.3 F (36.8 C)  98.5 F (36.9 C)  TempSrc:  Oral  Oral  SpO2: 99% 93% 94% 98%  Weight:  116.3 kg (256 lb 4.8 oz)    Height:        Wt Readings from Last 3 Encounters:  11/05/16 116.3 kg (256 lb 4.8 oz)  09/22/16 122.5 kg (270 lb)  07/18/16 133.4 kg (294 lb)     Intake/Output Summary (Last 24 hours) at 11/05/16 1243 Last data filed at 11/05/16 0700  Gross per 24 hour  Intake              370 ml  Output             2100 ml  Net            -1730 ml     Physical Exam  Awake Alert, Oriented X 3, No new F.N deficits, Normal affect [place].AT,PERRAL Supple Neck,No JVD, No cervical lymphadenopathy appriciated.  Symmetrical Chest wall movement, Good air movement bilaterally, CTAB, R sided Chest Tube RRR,No Gallops,Rubs or new Murmurs, No Parasternal Heave +ve B.Sounds, Abd Soft, No tenderness, No organomegaly appriciated, No rebound - guarding or rigidity. No Cyanosis, Clubbing or edema, No new Rash or bruise      Data Review:    CBC  Recent Labs Lab 11/01/16 0400 11/02/16 0600 11/03/16 0629  11/04/16 0311 11/05/16 0354  WBC 30.0* 30.8* 29.8* 25.7* 28.6*  HGB 11.8* 12.0* 12.8* 12.7* 12.8*  HCT 37.1* 37.7* 39.9 39.2 40.5  PLT 255 314 342 359 416*  MCV 91.6 91.3 92.1 92.7 92.3  MCH 29.1 29.1 29.6 30.0 29.2  MCHC 31.8 31.8 32.1 32.4 31.6  RDW 14.6 14.8 14.9 15.0 14.5    Chemistries   Recent Labs Lab 11/01/16 0400 11/02/16 0600 11/03/16 0629 11/04/16 0311 11/05/16 0354  NA 142 142 144 143 137  K 4.6 3.7 3.9 3.7 4.0  CL 106 107 109 109 107  CO2 _1 21*  GLUCOSE 100* 95 107* 98 88  BUN 23* 19 20 21* 18  CREATININE 0.99 1.00 1.04 1.09 1.02  CALCIUM 7.7* 7.9* 8.1* 8.0* 8.2*  MG 1.9 2.0 2.1 2.3 2.2  AST  --  26  --   --   --  ALT  --  16*  --   --   --   ALKPHOS  --  54  --   --   --   BILITOT  --  1.1  --   --   --    ------------------------------------------------------------------------------------------------------------------ No results for input(s): CHOL, HDL, LDLCALC, TRIG, CHOLHDL, LDLDIRECT in the last 72 hours.  Lab Results  Component Value Date   HGBA1C 5.5 07/18/2016   ------------------------------------------------------------------------------------------------------------------ No results for input(s): TSH, T4TOTAL, T3FREE, THYROIDAB in the last 72 hours.  Invalid input(s): FREET3 ------------------------------------------------------------------------------------------------------------------ No results for input(s): VITAMINB12, FOLATE, FERRITIN, TIBC, IRON, RETICCTPCT in the last 72 hours.  Coagulation profile No results for input(s): INR, PROTIME in the last 168 hours.  No results for input(s): DDIMER in the last 72 hours.  Cardiac Enzymes  Recent Labs Lab 10/29/16 1329  TROPONINI 0.24*   ------------------------------------------------------------------------------------------------------------------ No results found for: BNP  Micro Results Recent Results (from the past 240 hour(s))  Urine culture     Status: None    Collection Time: 10/27/16 10:57 PM  Result Value Ref Range Status   Specimen Description URINE, RANDOM  Final   Special Requests NONE  Final   Culture NO GROWTH Performed at [facility]   Final   Report Status 10/29/2016 FINAL  Final  Blood Culture (routine x 2)     Status: None   Collection Time: 10/27/16 11:00 PM  Result Value Ref Range Status   Specimen Description RIGHT ANTECUBITAL  Final   Special Requests BOTTLES DRAWN AEROBIC AND ANAEROBIC 5CC EACH  Final   Culture NO GROWTH 5 DAYS  Final   Report Status 11/01/2016 FINAL  Final  Blood Culture (routine x 2)     Status: Abnormal   Collection Time: 10/27/16 11:22 PM  Result Value Ref Range Status   Specimen Description NECK RIGHT  Final   Special Requests BOTTLES DRAWN AEROBIC AND ANAEROBIC 5CC EACH  Final   Culture  Setup Time   Final    GRAM POSITIVE COCCI Gram Stain Report Called to,Read Back By and Verified With: FRAZIER,E. AT 2158 ON 10/28/2016 BY EVA ANAEROBIC BOTTLE  Performed at [facility] CRITICAL RESULT CALLED TO, READ BACK BY AND VERIFIED WITH: J. MARKEL, [place] [id number] BY Rhea Bleacher    Culture (A)  Final    STAPHYLOCOCCUS SPECIES (COAGULASE NEGATIVE) THE SIGNIFICANCE OF ISOLATING THIS ORGANISM FROM A SINGLE SET OF BLOOD CULTURES WHEN MULTIPLE SETS ARE DRAWN IS UNCERTAIN. PLEASE NOTIFY THE MICROBIOLOGY DEPARTMENT WITHIN ONE WEEK IF SPECIATION AND SENSITIVITIES ARE REQUIRED. Performed at [facility]    Report Status 10/30/2016 FINAL  Final  Blood Culture ID Panel (Reflexed)     Status: Abnormal   Collection Time: 10/27/16 11:22 PM  Result Value Ref Range Status   Enterococcus species NOT DETECTED NOT DETECTED Final   Listeria monocytogenes NOT DETECTED NOT DETECTED Final   Staphylococcus species DETECTED (A) NOT DETECTED Final    Comment: CRITICAL RESULT CALLED TO, READ BACK BY AND VERIFIED WITH: J MARKEL,PHARMD AT 0737 BY S YARBROUGH     Staphylococcus aureus NOT DETECTED NOT DETECTED Final   Methicillin resistance DETECTED (A) NOT DETECTED Final    Comment: CRITICAL RESULT CALLED TO, READ BACK BY AND VERIFIED WITH: J MARKEL,PHARMD AT 0737 BY S YARBROUGH    Streptococcus species NOT DETECTED NOT DETECTED Final   Streptococcus agalactiae NOT DETECTED NOT DETECTED Final   Streptococcus pneumoniae NOT DETECTED  NOT DETECTED Final   Streptococcus pyogenes NOT DETECTED NOT DETECTED Final   Acinetobacter baumannii NOT DETECTED NOT DETECTED Final   Enterobacteriaceae species NOT DETECTED NOT DETECTED Final   Enterobacter cloacae complex NOT DETECTED NOT DETECTED Final   Escherichia coli NOT DETECTED NOT DETECTED Final   Klebsiella oxytoca NOT DETECTED NOT DETECTED Final   Klebsiella pneumoniae NOT DETECTED NOT DETECTED Final   Proteus species NOT DETECTED NOT DETECTED Final   Serratia marcescens NOT DETECTED NOT DETECTED Final   Haemophilus influenzae NOT DETECTED NOT DETECTED Final   Neisseria meningitidis NOT DETECTED NOT DETECTED Final   Pseudomonas aeruginosa NOT DETECTED NOT DETECTED Final   Candida albicans NOT DETECTED NOT DETECTED Final   Candida glabrata NOT DETECTED NOT DETECTED Final   Candida krusei NOT DETECTED NOT DETECTED Final   Candida parapsilosis NOT DETECTED NOT DETECTED Final   Candida tropicalis NOT DETECTED NOT DETECTED Final    Comment: Performed at [facility]  MRSA PCR Screening     Status: Abnormal   Collection Time: 10/28/16  5:32 AM  Result Value Ref Range Status   MRSA by PCR POSITIVE (A) NEGATIVE Final    Comment:        The GeneXpert MRSA Assay (FDA approved for NASAL specimens only), is one component of a comprehensive MRSA colonization surveillance program. It is not intended to diagnose MRSA infection nor to guide or monitor treatment for MRSA infections. RESULT CALLED TO, READ BACK BY AND VERIFIED WITH: [facility] RN AT 1646 10/28/16 BY [place]   Culture,  respiratory (NON-Expectorated)     Status: None   Collection Time: 10/28/16  3:35 PM  Result Value Ref Range Status   Specimen Description TRACHEAL ASPIRATE  Final   Special Requests NONE  Final   Gram Stain   Final    ABUNDANT WBC PRESENT, PREDOMINANTLY PMN NO SQUAMOUS EPITHELIAL CELLS SEEN MODERATE GRAM POSITIVE COCCI IN PAIRS AND CHAINS    Culture MODERATE GROUP A STREP (S.PYOGENES) ISOLATED  Final   Report Status 10/31/2016 FINAL  Final  Culture, body fluid-bottle     Status: None (Preliminary result)   Collection Time: 10/31/16  3:15 PM  Result Value Ref Range Status   Specimen Description PLEURAL FLUID  Final   Special Requests RIGHT  Final   Culture NO GROWTH 4 DAYS  Final   Report Status PENDING  Incomplete  Gram stain     Status: None   Collection Time: 10/31/16  3:15 PM  Result Value Ref Range Status   Specimen Description PLEURAL FLUID  Final   Special Requests RIGHT  Final   Gram Stain   Final    WBC PRESENT,BOTH PMN AND MONONUCLEAR GRAM POSITIVE COCCI IN PAIRS CYTOSPIN    Report Status 10/31/2016 FINAL  Final  Aerobic/Anaerobic Culture (surgical/deep wound)     Status: None   Collection Time: 10/31/16  3:20 PM  Result Value Ref Range Status   Specimen Description PLEURAL RIGHT  Final   Special Requests LUNG PEEL  Final   Gram Stain   Final    ABUNDANT WBC PRESENT, PREDOMINANTLY PMN FEW GRAM POSITIVE COCCI IN PAIRS    Culture   Final    RARE STAPHYLOCOCCUS SPECIES (COAGULASE NEGATIVE) NO ANAEROBES ISOLATED    Report Status 11/05/2016 FINAL  Final   Organism ID, Bacteria STAPHYLOCOCCUS SPECIES (COAGULASE NEGATIVE)  Final      Susceptibility   Staphylococcus species (coagulase negative) - MIC*    CIPROFLOXACIN <=0.5 SENSITIVE Sensitive  ERYTHROMYCIN <=0.25 SENSITIVE Sensitive     GENTAMICIN 1 SENSITIVE Sensitive     OXACILLIN 1 RESISTANT Resistant     TETRACYCLINE <=1 SENSITIVE Sensitive     VANCOMYCIN <=0.5 SENSITIVE Sensitive     TRIMETH/SULFA 80  RESISTANT Resistant     CLINDAMYCIN <=0.25 SENSITIVE Sensitive     RIFAMPIN <=0.5 SENSITIVE Sensitive     Inducible Clindamycin NEGATIVE Sensitive     * RARE STAPHYLOCOCCUS SPECIES (COAGULASE NEGATIVE)    Radiology Reports Ct Head Wo Contrast  Result Date: 10/28/2016 CLINICAL DATA:  Altered mental status. EXAM: CT HEAD WITHOUT CONTRAST TECHNIQUE: Contiguous axial images were obtained from the base of the skull through the vertex without intravenous contrast. COMPARISON:  CT scan of March 27, 2011. FINDINGS: Brain: No evidence of acute infarction, hemorrhage, hydrocephalus, extra-axial collection or mass lesion/mass effect. The appearance of the brain is stable compared to prior exam of 2012. Vascular: No hyperdense vessel or unexpected calcification. Skull: Normal. Negative for fracture or focal lesion. Sinuses/Orbits: Bilateral frontal, ethmoid, sphenoid and maxillary sinusitis is noted. Other: None. IMPRESSION: No acute intracranial abnormality seen. Pansinusitis is noted. This exam was interpreted in consultation with Dr. Lars Pinks, neuroradiologist. Electronically Signed   By: Marijo Conception, M.D.   On: 10/28/2016 15:11   Ct Chest Wo Contrast  Result Date: 10/28/2016 CLINICAL DATA:  Respiratory failure, fall. EXAM: CT CHEST WITHOUT CONTRAST TECHNIQUE: Multidetector CT imaging of the chest was performed following the standard protocol without IV contrast. COMPARISON:  Radiograph of October 27, 2016. CT scan of June 10, 2014. FINDINGS: Cardiovascular: There is no evidence of thoracic aortic aneurysm. Mediastinum/Nodes: Endotracheal tube is in grossly good position. Nasogastric tube is seen passing through esophagus into distal stomach. Stable mildly enlarged mediastinal adenopathy is noted which most likely is reactive in etiology. Lungs/Pleura: No pneumothorax is noted. Left posterior basilar airspace opacity is noted concerning for atelectasis or pneumonia. Large partially loculated right pleural  effusion is noted with associated atelectasis of right lower lobe. Right upper lobe airspace opacity is noted concerning for atelectasis or pneumonia. Upper Abdomen: No acute abnormality. Musculoskeletal: No chest wall mass or suspicious bone lesions identified. IMPRESSION: Endotracheal and nasogastric tubes are in grossly good position. Left lower lobe pneumonia or atelectasis is noted. Large partially loculated right pleural effusion is noted with associated atelectasis of right lower lobe. Right upper lobe pneumonia or atelectasis is noted. Electronically Signed   By: Marijo Conception, M.D.   On: 10/28/2016 15:31   Dg Chest Port 1 View  Result Date: 11/05/2016 CLINICAL DATA:  Productive cough . EXAM: PORTABLE CHEST 1 VIEW COMPARISON:  11/04/2016 . FINDINGS: Mediastinum appears normal. Heart size normal. Right lower lobe infiltrate consistent pneumonia. Low lung volumes. Small right pleural effusion. IMPRESSION: Right lower lobe infiltrate consistent pneumonia. Associated right lower lobe atelectasis. Small right pleural effusion. Electronically Signed   By: Marcello Moores  Register   On: 11/05/2016 09:13   Dg Chest Port 1 View  Result Date: 11/04/2016 CLINICAL DATA:  40 year old male with history of right-sided chest tube. EXAM: PORTABLE CHEST 1 VIEW COMPARISON:  Chest x-ray 11/03/2016. FINDINGS: Lung volumes are low. Right-sided chest tube remains in position with tip in medial aspect of the upper right hemithorax. A second chest tube is noted with tip in the medial aspect of the lower right hemithorax. Moderate volume of right-sided pleural fluid is again noted, most pronounced in the subpulmonic region, with an additional loculated area in the medial aspect of the right apex.  Some associated areas of atelectasis and/or consolidation are noted in the right lung base, and to a lesser extent in the medial aspect of the left lower lobe. Overall, aeration appears unchanged. No left pleural effusion. No pneumothorax.  No evidence of pulmonary edema. Heart size is normal. Upper mediastinal contours are distorted by patient's rotation to the right. IMPRESSION: 1. No significant change in the radiographic appearance the chest, as detailed above. Electronically Signed   By: Vinnie Langton M.D.   On: 11/04/2016 07:18   Dg Chest Port 1 View  Result Date: 11/03/2016 CLINICAL DATA:  Hypertension.  ETT. EXAM: PORTABLE CHEST 1 VIEW COMPARISON:  November 02, 2016 FINDINGS: The more medial and inferior right chest tubes are stable. The more lateral tube has been removed. No pneumothorax. Persistent decreased aeration and probable effusion on the right. No other interval changes or acute abnormalities. IMPRESSION: It appears that 1 of the 3 chest tubes has been removed. The other tubes are in stable position. Persistent diminished aeration on the right with probable effusion and underlying atelectasis. No other change. Electronically Signed   By: Dorise Bullion III M.D   On: 11/03/2016 08:05   Dg Chest Port 1 View  Result Date: 11/02/2016 CLINICAL DATA:  40 year old male status post right VIDEO ASSISTED THORACOSCOPY on 10/31/16 for early organizing right side empyema. Right lower lobe lung abscess. Initial encounter. EXAM: PORTABLE CHEST 1 VIEW COMPARISON:  11/01/2016 and earlier. FINDINGS: Portable AP view at 0457 hours. Extubated. Enteric tube removed. Right IJ central line removed. Two right chest tubes remain and are stable. No pneumothorax identified. Stable Patchy and confluent opacity at the right lung base. Continued somewhat low lung volumes. Stable ventilation elsewhere. Stable cardiac size and mediastinal contours. IMPRESSION: 1. Extubated and enteric tube removed. 2. Stable right chest tubes with no pneumothorax. 3. Stable ventilation with Patchy and confluent residual right lung base opacity. Electronically Signed   By: Genevie Ann M.D.   On: 11/02/2016 08:01   Dg Chest Port 1 View  Result Date: 11/01/2016 CLINICAL  DATA:  Empyema . EXAM: PORTABLE CHEST 1 VIEW COMPARISON:  10/31/2016 . FINDINGS: Endotracheal tube, NG tube, right IJ line, right chest tubes in stable position. Right pleural effusion has improved. Low lung volumes with basilar atelectasis and infiltrates. No pneumothorax. Mild right chest wall subcutaneous emphysema . IMPRESSION: 1. Lines and tubes including right chest tubes in stable position. No pneumothorax. Interim improvement of right pleural effusion. Mild right chest wall subcutaneous emphysema. 2.  Low lung volumes with basilar atelectasis infiltrates. Electronically Signed   By: Marcello Moores  Register   On: 11/01/2016 07:25   Dg Chest Port 1 View  Result Date: 10/31/2016 CLINICAL DATA:  Shortness of breath.  Endotracheal placement. EXAM: PORTABLE CHEST 1 VIEW COMPARISON:  10/30/2016 FINDINGS: Endotracheal tube has its tip 2.5 cm above the carina. Nasogastric tube enters the abdomen. Right internal jugular central line tip is in the SVC above the right atrium. Right pleural effusion with right lower lobe volume loss and/or infiltrate persists. Patchy infiltrate persists within the left lung. Findings are similar to yesterday's study, possibly with mild worsening. IMPRESSION: Lines and tubes well positioned. Persistent large effusion on the right with right lung volume loss and infiltrate. Left lung infiltrate. Findings may be slightly worsened. Electronically Signed   By: Nelson Chimes M.D.   On: 10/31/2016 07:17   Dg Chest Port 1 View  Result Date: 10/30/2016 CLINICAL DATA:  Encounter for central line placement. EXAM: PORTABLE CHEST 1  VIEW COMPARISON:  10/30/2016 FINDINGS: A new right IJ central line has been placed. Central line tip in the upper SVC region. Endotracheal tube is 4.4 cm above the carina. Nasogastric tube extends into the abdomen. There appears to be a large right pleural effusion with consolidation or airspace disease in the right lung. Stable interstitial densities in the left lung.  Heart size is within normal limits and stable. Negative for a pneumothorax. IMPRESSION: Central line tip in the upper SVC region. No significant change in the pleural and parenchymal disease in the right chest. Negative for a pneumothorax. Electronically Signed   By: Markus Daft M.D.   On: 10/30/2016 15:25   Dg Chest Port 1 View  Result Date: 10/30/2016 CLINICAL DATA:  Central line placement EXAM: PORTABLE CHEST 1 VIEW COMPARISON:  Chest x-ray of 10/30/2016 and CT chest of 10/28/2006 2 FINDINGS: A venous sheath overlies the right internal jugular vein, unchanged in position. No additional central venous line is seen. There is poor aeration of the right lung with persistent opacity at the right lung base which appears to be due to pleural effusion and atelectasis, infiltrate, or mass. Minimal perihilar haziness is present on the left. Endotracheal tube tip appears to be approximately 2.8 cm above the carina. NG tube is present extending below the hemidiaphragm. IMPRESSION: 1. Right venous sheath overlies the internal jugular vein. No additional central venous line is seen. 2. No change in pleural and parenchymal opacity at the right lung base with volume loss on the right. 3. Endotracheal tube tip 2.8 cm above the carina. 4. Mild left perihilar haziness. Electronically Signed   By: Ivar Drape M.D.   On: 10/30/2016 11:40   Dg Chest Port 1 View  Result Date: 10/30/2016 CLINICAL DATA:  Acute respiratory failure EXAM: PORTABLE CHEST 1 VIEW COMPARISON:  10/29/2016 FINDINGS: Support devices are unchanged. Moderate to large right pleural effusion with diffuse right lung airspace disease, stable. Left base atelectasis noted. Heart is borderline in size with mild vascular congestion. IMPRESSION: No significant change diffuse right lung airspace disease and left base atelectasis. Moderate to large right effusion. Electronically Signed   By: Rolm Baptise M.D.   On: 10/30/2016 07:18   Portable Chest Xray  Result  Date: 10/29/2016 CLINICAL DATA:  Hypoxia EXAM: PORTABLE CHEST 1 VIEW COMPARISON:  October 28, 2016 chest CT and chest radiograph October 27, 2016 FINDINGS: Endotracheal tube tip is 2.6 cm above carina. Central catheter tip is in the superior vena cava. Nasogastric tube tip and side port are below the diaphragm. No pneumothorax. There is airspace consolidation throughout most of the right lung, progressed from 2 days prior. There is a moderate pleural effusion on the right. On the left, there is patchy atelectasis and consolidation medial left base, stable. Heart is upper normal in size. The pulmonary vascularity is within normal limits. No adenopathy evident. IMPRESSION: Tube and catheter positions as described without pneumothorax. Progression of consolidation and effusion on the right. Stable consolidation atelectasis left base. Stable cardiac silhouette. Electronically Signed   By: Lowella Grip III M.D.   On: 10/29/2016 07:18   Dg Chest Portable 1 View  Result Date: 10/27/2016 CLINICAL DATA:  Patient status post intubation. EXAM: PORTABLE CHEST 1 VIEW COMPARISON:  Chest CT 06/10/2014; chest radiograph 06/09/2014. FINDINGS: ET tube terminates in the distal trachea. Enteric tube courses inferior to the diaphragm. Monitoring leads overlie the patient. Lateral right hemi thorax is excluded from view. Diffuse bilateral heterogeneous pulmonary opacities. No pleural effusion or  pneumothorax. IMPRESSION: Diffuse bilateral heterogeneous pulmonary opacities may represent pulmonary edema in the appropriate clinical setting. Infection not excluded. ET tube terminates in the distal trachea. Electronically Signed   By: Lovey Newcomer M.D.   On: 10/27/2016 23:47   Dg Chest Port 1v Same Day  Result Date: 10/28/2016 CLINICAL DATA:  40 y/o  M; central line placement. EXAM: PORTABLE CHEST 1 VIEW COMPARISON:  10/27/2016 chest radiograph. FINDINGS: Diffuse patchy opacification of lungs greater on the right may  represent asymmetric pulmonary edema or multi focal pneumonia. Interval placement of a right central venous catheter with tip projecting over the right supraclavicular fossa. Endotracheal tube unchanged in position approximately 2-1/2 cm from carina. Enteric tube tip below the field of view in the abdomen. IMPRESSION: Right central venous catheter tip projects over the right supraclavicular fossa. Stable position of enteric and endotracheal tubes. Right greater than left lung patchy opacification may represent asymmetric edema or multi focal pneumonia. Electronically Signed   By: Kristine Garbe M.D.   On: 10/28/2016 00:04    Time Spent in minutes  30   Margree Gimbel K M.D on 11/05/2016 at 12:43 PM  Between 7am to 7pm - Pager - [phone]  After 7pm go to [email or web address] - password [facility]  [facility] -  Office  [phone]

## 2016-11-05 NOTE — Progress Notes (Signed)
Speech Language Pathology Treatment: Dysphagia  Patient Details Name: Eric Hayes MRN: [id number] DOB: 07-10-1976 Today's Date: 11/05/2016 Time: 0921-0929 SLP Time Calculation (min) (ACUTE ONLY): 8 min  Assessment / Plan / Recommendation Clinical Impression  Pt shows decreased signs of aspiration at bedside, likely indicative of improving airway protection given increased time post-extubation. Recommend to proceed with MBS today to assess for least restrictive diet. Can continue with ice chips PRN and meds crushed in puree until that time.   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 Reportedly had a fall several days prior to admit where he struck his chest and was wearing ace-wrap on his chest on presentation. In ER he had progressively worsening WOB, hypoxia, hypotension and progressive AMS. He required intubation shortly after arrival to ER and started on vasopressors. Further w/u revealed AKI with Scr 2.2, lactate 4, nonAG metabolic acidosis.CXR 11/28 showed diffuse right lung airspace disease and left base atelectasis. Intubated 11/26. Extubated 11/30      SLP Plan  MBS     Recommendations  Diet recommendations: NPO Medication Administration: Crushed with puree                Oral Care Recommendations: Oral care QID;Oral care prior to ice chip/H20 Follow up Recommendations: Skilled Nursing facility Plan: MBS       GO                Germain Osgood 11/05/2016, 9:36 AM  Germain Osgood, M.A. CCC-SLP [phone]

## 2016-11-05 NOTE — Progress Notes (Addendum)
One chest tube taken out as ordered,well tolerated by patient  while this RN was  tieing the sutures, the sutures broke, Jadene Pierini, PA paged by another Therapist, sports, instruction was given to paint site with compound benzoin tincture dress the surgical site with steri stripes, vaseline quaze and dry guaze and tape, dressing now in place, will continue to monitor

## 2016-11-05 NOTE — Progress Notes (Signed)
Physical Therapy Treatment Patient Details Name: Eric Hayes MRN: [id number] DOB: 12-27-1975 Today's Date: 11/05/2016    History of Present Illness 40 yo admitted to [facility] with NSTEMI and VDRF with intubation 11/26-11/30, empyema s/p VATS with CT. PMHx: bipolar, chronic back pain, DM, HTN, OSA    PT Comments    Patient was willing to ambulate upon arrival today. Patient ambulated with some cuing, and patient noted feeling fatigued and slightly short of breath midway though ambulation. Patient was instructed to breathe through nose for supplemental O2, as the patient tended to breath through his mouth. Patient was educated on discharge plan, contingent on his wife or other family being available to care for him after discharge. Will continue to follow.   SPO2 rest 3LO2: 93% SPO2 post ambulation 3LO2: 92% HR rest: 99  HR post ambulation: 132    Follow Up Recommendations  Home health PT;Supervision - Intermittent     Equipment Recommendations       Recommendations for Other Services       Precautions / Restrictions      Mobility  Bed Mobility Overal bed mobility: Needs Assistance Bed Mobility: Rolling;Supine to Sit Rolling: Supervision   Supine to sit: Min assist;HOB elevated     General bed mobility comments: cuing for pushing into bed once sidelying. Min assist to raise trunk from supine to sit.   Transfers Overall transfer level: Needs assistance Equipment used: Rolling walker (2 wheeled) Transfers: Sit to/from Stand Sit to Stand: Min assist         General transfer comment: Patient cued to push from bed upon rising. Patient initiated rising and needed min assist during rising. Patient cued to stand upright once standing.   Ambulation/Gait Ambulation/Gait assistance: Min guard Ambulation Distance (Feet): 160 Feet Assistive device: Rolling walker (2 wheeled) Gait Pattern/deviations: Trunk flexed;Drifts right/left Gait velocity: decreased  Gait  velocity interpretation: Below normal speed for age/gender General Gait Details: Patient required cuing to stand upright in walker and to take deep breaths during ambulation. Patient was redirected 2 times to avoid objects in environment.    Stairs            Wheelchair Mobility    Modified Rankin (Stroke Patients Only)       Balance Overall balance assessment: Needs assistance Sitting-balance support: Bilateral upper extremity supported Sitting balance-Leahy Scale: Good     Standing balance support: Bilateral upper extremity supported;During functional activity Standing balance-Leahy Scale: Fair Standing balance comment: Patient used RW for UE support, as his "legs felt like jello"                     Cognition Arousal/Alertness: Awake/alert Behavior During Therapy: Flat affect Overall Cognitive Status: Within Functional Limits for tasks assessed Area of Impairment: Following commands;Safety/judgement       Following Commands: Follows one step commands consistently       General Comments: Patient knew name, birthday, and more PLOF information today. Verified by mother who was present during treatment.     Exercises General Exercises - Lower Extremity Long Arc Quad: AROM;Both;10 reps;Seated Hip Flexion/Marching: AROM;Both;10 reps;Seated    General Comments        Pertinent Vitals/Pain Pain Assessment: Faces Pain Location: Patient noted R trunk pain; [place]     Home Living Family/patient expects to be discharged to:: Private residence Living Arrangements: Spouse/significant other Available Help at Discharge: Family (wife works ) Type of Home: Mobile home Home Access: Stairs to enter Entrance Stairs-Rails: Can  reach both Home Layout: One level Home Equipment: Walker - 2 wheels      Prior Function Level of Independence: Independent      Comments: plays with daughter, goes out with wife every now and then    PT Goals (current goals can now  be found in the care plan section) Acute Rehab PT Goals PT Goal Formulation: With patient Time For Goal Achievement: 11/19/16 Potential to Achieve Goals: Good Progress towards PT goals: Progressing toward goals    Frequency    Min 3X/week      PT Plan Discharge plan needs to be updated    Co-evaluation             End of Session Equipment Utilized During Treatment: Gait belt;Oxygen Activity Tolerance: Patient tolerated treatment well;Patient limited by fatigue Patient left: in chair;with call bell/phone within reach;with chair alarm set;with family/visitor present     Time: [id number] PT Time Calculation (min) (ACUTE ONLY): 28 min  Charges:  $Gait Training: 8-22 mins $Therapeutic Exercise: 8-22 mins                    G Codes:      Eric Hayes 11/07/2016, 3:12 PM   [place] SPT [id number]

## 2016-11-05 NOTE — Progress Notes (Signed)
Modified Barium Swallow Progress Note  Patient Details  Name: Eric Hayes MRN: [id number] Date of Birth: 1976/10/12  Today's Date: 11/05/2016  Modified Barium Swallow completed.  Full report located under Chart Review in the Imaging Section.  Brief recommendations include the following:  Clinical Impression  Pt presents with mild pharyngeal sensory dysphagia s/p intubation. Pt with aspiration of thin liquids d/t delayed swallow initation to pyriform sinuses and difficulty establishing subglottic pressure. Pt exhibited penetration with nectar thick liquids. Pt cued to throat clear and throat clear was effective in clearing penetrates. He did exhibit difficulty with coordinating swallow initiation when consuming nectar with whole pill as evidenced by deep penetration of nectar thick liquids. Pt consumed trials of dysphagia 3 textures without aspiration. Given the above, recommend dysphagia 3 diet, nectar thick liquids by cup (NO STRAW) single sips with throat clear after sips of liquids. Recommend whole pills in applesauce. Given the above progress, pt's prognosis remains good.    Swallow Evaluation Recommendations       SLP Diet Recommendations: Dysphagia 3 (Mech soft) solids;Nectar thick liquid   Liquid Administration via: Cup   Medication Administration: Whole meds with puree   Supervision: Full supervision/cueing for compensatory strategies (Single sips, throat clear after sips of liquid)   Compensations: Minimize environmental distractions;Slow rate;Small sips/bites;Follow solids with liquid   Postural Changes: Seated upright at 90 degrees   Oral Care Recommendations: Oral care BID   Other Recommendations: Order thickener from pharmacy;Prohibited food (jello, ice cream, thin soups);Remove water pitcher   Kala Gassmann B. Rutherford Nail, M.S., CCC-SLP Speech-Language Pathologist [phone] Rollo Farquhar 11/05/2016,4:18 PM

## 2016-11-05 NOTE — Consult Note (Signed)
[facility] CM Primary Care Navigator  11/05/2016  Eric Hayes Apr 26, 1976 [id number]  Met with patient and wife Eric Hayes) at the bedside to identify possible discharge needs. Patient endorses Eric Bode PA-C with [facility] as the primary care provider.    Patient shared using [facility] pharmacy in [place] to obtain medications without any problem.   Patient's wife reports that she manages medications for him at home.   Wife or mother Eric Hayes) provides transportation to his doctors' appointments.  Mother and wife are his primary caregivers at home as stated.   Discharge plan is home with home health services per wife.  Patient and wife voiced understanding to call primary care provider's office, when he gets home for a post discharge follow-up appointment within a week or sooner if needs arise. Patient letter provided as a reminder.  Wife states that patient's DM is well managed that he is no longer needing medication at present. Patient reports continuing to monitor blood sugar and keeping log for PCP to evaluate.  Both denied further needs or concerns at this time.  For additional questions please contact:  Eric Hayes, BSN, RN-BC [facility] PRIMARY CARE Navigator Cell: [phone]

## 2016-11-05 NOTE — Progress Notes (Signed)
Clinical Social Worker met patient and family at bedside. CSW spoke to patient about SNF placement and patient denied SNF. Patient stated "I am not going to a facility for rehab".   CSW signing off on patient at this time  Rhea Pink, MSW,  [place]

## 2016-11-05 NOTE — Progress Notes (Signed)
PT Cancellation Note  Patient Details Name: Eric Hayes MRN: [id number] DOB: 1976/09/06   Cancelled Treatment:    Reason Eval/Treat Not Completed: Patient at procedure or test/unavailable (pt leaving room for MBSS)   Taffany Heiser B Lexine Jaspers 11/05/2016, 11:58 AM Elwyn Reach, [place]

## 2016-11-05 NOTE — Progress Notes (Signed)
2nd attempt to place pt on CPAP tonight. Informed pt to tell RN when he is ready to be placed on

## 2016-11-05 NOTE — Progress Notes (Signed)
Verbal order received from Candiss Norse MD to discontinue foley
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
## 2016-11-05 NOTE — Progress Notes (Addendum)
      [address]       [place] [address]             [phone]       5 Days Post-Op Procedure(s) (LRB): VIDEO ASSISTED THORACOSCOPY (Right)  Subjective: He is awake and alert. He is supposed to be NPO but states a food tray was brought to him this am so he ate.  Objective: Vital signs in last 24 hours: Temp:  [97.8 F (36.6 C)-98.5 F (36.9 C)] 98.3 F (36.8 C) (12/04 0338) Pulse Rate:  [94-103] 100 (12/04 0338) Cardiac Rhythm: Sinus tachycardia (12/03 2000) Resp:  [16-24] 18 (12/04 0338) BP: (96-126)/(59-81) 126/65 (12/04 0338) SpO2:  [91 %-99 %] 94 % (12/04 0739) Weight:  [256 lb 4.8 oz (116.3 kg)] 256 lb 4.8 oz (116.3 kg) (12/04 0338)     Intake/Output from previous day: 12/03 0701 - 12/04 0700 In: 440 [P.O.:350; I.V.:40; IV Piggyback:50] Out: 2575 [Urine:2350; Chest Tube:225]   Physical Exam: CV: Slightly tachy Pulmonary: Slightly diminished right base and clear on the left. Wound: Dressing is dry and intact.  Lab Results: CBC:  Recent Labs  11/04/16 0311 11/05/16 0354  WBC 25.7* 28.6*  HGB 12.7* 12.8*  HCT 39.2 40.5  PLT 359 416*   BMET:   Recent Labs  11/04/16 0311 11/05/16 0354  NA 143 137  K 3.7 4.0  CL 109 107  CO2 24 21*  GLUCOSE 98 88  BUN 21* 18  CREATININE 1.09 1.02  CALCIUM 8.0* 8.2*    PT/INR: No results for input(s): LABPROT, INR in the last 72 hours. ABG:  INR: Will add last result for INR, ABG once components are confirmed Will add last 4 CBG results once components are confirmed  Assessment/Plan:   1.  Pulmonary - On 5 liters of [place]. Chest tube output 225 cc last 24 hours. Chest tubes to suction and without air leak. No CXR ordered so will order. Hope to remove one more chest tube soon. Encourage incentive spirometer. 2. ID-WBC increased to 28,600. Remains afebrile. On Vancomycin for PNA. 3. Anemia-H and H stable at 12.8 and 40.5.   ZIMMERMAN,DONIELLE MPA-C 11/05/2016,8:04 AM  Patient seen and  examined, agree with above Mental status dramatically improved Drainage trending down and it looks like all the drainage has been from the posterior tube- will dc anterior (diaphragmatic tube)  Remo Lipps C. Roxan Hockey, MD [facility] [phone]

## 2016-11-06 ENCOUNTER — Inpatient Hospital Stay (HOSPITAL_COMMUNITY): Payer: Medicare Other

## 2016-11-06 LAB — CBC
HCT: 34.9 % — ABNORMAL LOW (ref 39.0–52.0)
HEMOGLOBIN: 11.3 g/dL — AB (ref 13.0–17.0)
MCH: 29.4 pg (ref 26.0–34.0)
MCHC: 32.4 g/dL (ref 30.0–36.0)
MCV: 90.6 fL (ref 78.0–100.0)
Platelets: 413 10*3/uL — ABNORMAL HIGH (ref 150–400)
RBC: 3.85 MIL/uL — ABNORMAL LOW (ref 4.22–5.81)
RDW: 14.2 % (ref 11.5–15.5)
WBC: 21.4 10*3/uL — ABNORMAL HIGH (ref 4.0–10.5)

## 2016-11-06 LAB — GLUCOSE, CAPILLARY
GLUCOSE-CAPILLARY: 116 mg/dL — AB (ref 65–99)
GLUCOSE-CAPILLARY: 114 mg/dL — AB (ref 65–99)
Glucose-Capillary: 112 mg/dL — ABNORMAL HIGH (ref 65–99)
Glucose-Capillary: 104 mg/dL — ABNORMAL HIGH (ref 65–99)
Glucose-Capillary: 124 mg/dL — ABNORMAL HIGH (ref 65–99)

## 2016-11-06 LAB — MAGNESIUM: MAGNESIUM: 2.1 mg/dL (ref 1.7–2.4)

## 2016-11-06 MED ORDER — FAMOTIDINE 40 MG/5ML PO SUSR
20.0000 mg | Freq: Two times a day (BID) | ORAL | Status: DC
  Administered 2016-11-06 – 2016-11-08 (×4): 20 mg via ORAL
  Filled 2016-11-06 (×7): qty 2.5

## 2016-11-06 MED ORDER — ENSURE ENLIVE PO LIQD
237.0000 mL | Freq: Two times a day (BID) | ORAL | Status: DC
  Administered 2016-11-06 – 2016-11-08 (×5): 237 mL via ORAL

## 2016-11-06 MED ORDER — SODIUM CHLORIDE 0.9% FLUSH
10.0000 mL | INTRAVENOUS | Status: DC | PRN
  Administered 2016-11-07: 10 mL
  Filled 2016-11-06: qty 40

## 2016-11-06 NOTE — Progress Notes (Addendum)
      [address]       [place] [address]             [phone]       6 Days Post-Op Procedure(s) (LRB): VIDEO ASSISTED THORACOSCOPY (Right)  Subjective: He is hungry this am.  Objective: Vital signs in last 24 hours: Temp:  [98.1 F (36.7 C)-98.6 F (37 C)] 98.6 F (37 C) (12/05 0415) Pulse Rate:  [70-106] 80 (12/05 0415) Cardiac Rhythm: Sinus tachycardia (12/04 2200) Resp:  [18-20] 18 (12/05 0415) BP: (126-136)/(50-75) 126/50 (12/05 0415) SpO2:  [92 %-98 %] 94 % (12/05 0415) Weight:  [260 lb (117.9 kg)] 260 lb (117.9 kg) (12/05 0415)     Intake/Output from previous day: 12/04 0701 - 12/05 0700 In: 30 [P.O.:30] Out: 660 [Urine:600; Chest Tube:60]   Physical Exam: CV: Tachycardic Pulmonary: Diminished right base and clear on the left. Wound: Clean and dry. Dressing over chest tube sites clean and dry.  Lab Results: CBC:  Recent Labs  11/05/16 0354 11/06/16 0329  WBC 28.6* 21.4*  HGB 12.8* 11.3*  HCT 40.5 34.9*  PLT 416* 413*   BMET:   Recent Labs  11/05/16 0354 11/05/16 1334  NA 137 137  K 4.0 3.7  CL 107 108  CO2 21* 22  GLUCOSE 88 105*  BUN 18 15  CREATININE 1.02 1.00  CALCIUM 8.2* 7.9*    PT/INR: No results for input(s): LABPROT, INR in the last 72 hours. ABG:  INR: Will add last result for INR, ABG once components are confirmed Will add last 4 CBG results once components are confirmed  Assessment/Plan:   1.  Pulmonary - Down to 3 liters of [place]. Chest tube output 60 cc last 24 hours. Chest tube to suction. CXR this am appears stable. Hope to remove remaining chest tube. Encourage incentive spirometer. 2. ID-WBC decreased to 21,400. Remains afebrile. On Vancomycin for PNA. 3. Anemia-H and H stable at 11.3 and 34.9. 4. Swallow study recommendations are dysphagia 3 (mech soft) solids and nectar thick liquid.  ZIMMERMAN,DONIELLE MPA-C 11/06/2016,7:46 AM  Patient seen and examined, agree with above Still has some  residual atelectasis/ effusion at right base Minimal drainage from CT. Dc chest tube  Remo Lipps C. Roxan Hockey, MD [facility] [phone]

## 2016-11-06 NOTE — Progress Notes (Signed)
Peripherally Inserted Central Catheter/Midline Placement  The IV Nurse has discussed with the patient and/or persons authorized to consent for the patient, the purpose of this procedure and the potential benefits and risks involved with this procedure.  The benefits include less needle sticks, lab draws from the catheter, and the patient may be discharged home with the catheter. Risks include, but not limited to, infection, bleeding, blood clot (thrombus formation), and puncture of an artery; nerve damage and irregular heartbeat and possibility to perform a PICC exchange if needed/ordered by physician.  Alternatives to this procedure were also discussed.  Bard Power PICC patient education guide, fact sheet on infection prevention and patient information card has been provided to patient /or left at bedside.    PICC/Midline Placement Documentation        Eric Hayes 11/06/2016, 2:21 PM

## 2016-11-06 NOTE — Progress Notes (Signed)
PROGRESS NOTE                                                                                                                                                                                                             Patient Demographics:    Eric Hayes, is a 40 y.o. male, DOB - August 22, 1976, NAT:[id number]  Admit date - 10/27/2016   Admitting Physician Rush Landmark, MD  Outpatient Primary MD for the patient is TYSINGER, DAVID Audelia Acton, PA-C  LOS - 9  Chief Complaint  Patient presents with  . 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  Reportedly had a fall several days prior to admit where he struck his chest and was wearing ace-wrap on his chest on presentation.  In ER he had progressively worsening WOB, hypoxia, hypotension and progressive AMS.  He required intubation shortly after arrival to ER and started on vasopressors.  Further w/u revealed AKI with Scr 2.2, lactate 4, nonAG metabolic acidosis.    He was diagnosed with pneumonia with right-sided loculated effusion, this likely was parapneumonic, he was seen by pulmonary critical care along with cardiothoracic surgery underwent right-sided chest tube placement, was stabilized and transferred to my care on 11/05/2016. He still has significant leukocytosis and he is currently nothing by mouth due to dysphagia.   Subjective:    Dimas Millin today has, No headache, No chest pain, No abdominal pain - No Nausea, No new weakness tingling or numbness, No Cough - SOB.    Assessment  & Plan :     1. Acute hypoxic respiratory failure due to community-acquired pneumonia complicated by right-sided parapneumonic effusion requiring  chest tube placement by cardiothoracic surgery on 10/31/2016 after a VAT procedure.  Cultures noted, he is currently on vancomycin, significant leukocytosis but appears nontoxic, ID following current recommendations are to continue vancomycin, chest tube likely will be taken out by cardiothoracic surgery today, requested patient to continue using incentive spirometer and flutter valve, increase activity titrate off oxygen.  2. Septic shock up her admission. Had resolved.  3. Toxic encephalopathy. Resolved.  4. Dysphagia due to #  1 and #3. NPO, Speech following, MBS on 11-05-16.  5. Constipation. One BM after stool softener.  6.  History of gout. We'll start allopurinol once he is taking oral medications.  7. COPD. Stable no acute issues. Supportive care.  8. Leukocytosis. Likely due to #1. Monitor trend. No diarrhea. No other source of infection.  9. ?? DM2 - none  Lab Results  Component Value Date   HGBA1C 5.5 07/18/2016      Family Communication  :  None  Code Status :  Full  Diet : DIET DYS 3 Room service appropriate? Yes; Fluid consistency: Nectar Thick   Disposition Plan  :  TBD  Consults  :    PCCM, CVTS  Procedures  :   Dg Chest Port 1 View 10/30/2016 Diffuse right lung airspace disease and left base atelectasis. Moderate to large right effusion.   10/31/2016 still with right lung opacity and moderate to large effusion  11/01/2016 Low lung volumes with basilar atelectasis infiltrates. No pneumothorax. Interim improvement of right pleural effusion. Mild right chest wall subcutaneous emphysema. Lines and tubes including right chest tubes in stable position.  STUDIES:   CT chest 11/26>>>LLL and RUL pneumonia or atelectasis. Large partially loculated right pleural effusion.   2D echo 11/26>>> EF 60-65% without structural or functional abnormalities  LE doppler 11/27>>without DVT  CULTURES: BC x 2 11/26>>>coag neg staph in 1 out of 2. Urine  11/26>>>NGTD Sputum 11/26>>>Mod Group A Strep MRSA PCR positive Pleural fluid Cx 11/29>>NGTD. GS GPC in chains   LINES/TUBES: ETT 11/26>>>11/30 R IJ cortis (EDP) 11/26>>>11/28 CVC RIJ 11/28>> PIVs 11/26>> Foley and OGT 11/25>> 12/4 Chest tube, right 11/29>>  SIGNIFICANT EVENTS: 11/26: admitted and intubated due to AMS, acute resp failure and hypotension 11/28 Did well on minimal pressure support 11/29 VAT  DVT Prophylaxis  :    Heparin   Lab Results  Component Value Date   PLT 413 (H) 11/06/2016    Inpatient Medications  Scheduled Meds: . amphetamine-dextroamphetamine  20 mg Oral BID  . bisacodyl  10 mg Oral Daily  . budesonide (PULMICORT) nebulizer solution  0.5 mg Nebulization BID  . chlorhexidine gluconate (MEDLINE KIT)  15 mL Mouth Rinse BID  . docusate  100 mg Per Tube Once  . famotidine  20 mg Oral BID  . feeding supplement (ENSURE ENLIVE)  237 mL Oral BID BM  . heparin subcutaneous  5,000 Units Subcutaneous Q8H  . insulin aspart  0-15 Units Subcutaneous Q4H  . levalbuterol  0.63 mg Nebulization TID  . magnesium hydroxide  30 mL Per Tube Once  . mouth rinse  15 mL Mouth Rinse QID  . senna-docusate  1 tablet Oral QHS  . vancomycin  1,250 mg Intravenous Q12H   Continuous Infusions: . sodium chloride 10 mL/hr at 11/04/16 1616   PRN Meds:.acetaminophen, ondansetron (ZOFRAN) IV, oxyCODONE, RESOURCE THICKENUP CLEAR  Antibiotics  :    Anti-infectives    Start     Dose/Rate Route Frequency Ordered Stop   11/04/16 1600  vancomycin (VANCOCIN) 1,250 mg in sodium chloride 0.9 % 250 mL IVPB     1,250 mg 166.7 mL/hr over 90 Minutes Intravenous Every 12 hours 11/03/16 2351     10/30/16 1500  vancomycin (VANCOCIN) 1,250 mg in sodium chloride 0.9 % 250 mL IVPB  Status:  Discontinued     1,250 mg 166.7 mL/hr over 90 Minutes Intravenous Every 8 hours 10/30/16 1410 11/03/16 2351   10/29/16 0100  levofloxacin (LEVAQUIN) IVPB 750 mg  750 mg 100 mL/hr over 90 Minutes  Intravenous Every 24 hours 10/28/16 1024 11/04/16 0237   10/29/16 0000  levofloxacin (LEVAQUIN) IVPB 750 mg  Status:  Discontinued     750 mg 100 mL/hr over 90 Minutes Intravenous Every 24 hours 10/28/16 0948 10/28/16 1024   10/28/16 1030  vancomycin (VANCOCIN) IVPB 1000 mg/200 mL premix  Status:  Discontinued     1,000 mg 200 mL/hr over 60 Minutes Intravenous Every 12 hours 10/28/16 1024 10/30/16 1410   10/28/16 0045  aztreonam (AZACTAM) 1 g in dextrose 5 % 50 mL IVPB  Status:  Discontinued     1 g 100 mL/hr over 30 Minutes Intravenous Every 8 hours 10/28/16 0029 10/29/16 1256   10/28/16 0015  levofloxacin (LEVAQUIN) IVPB 750 mg     750 mg 100 mL/hr over 90 Minutes Intravenous  Once 10/28/16 0004 10/28/16 0240   10/28/16 0000  vancomycin (VANCOCIN) IVPB 1000 mg/200 mL premix     1,000 mg 200 mL/hr over 60 Minutes Intravenous  Once 10/27/16 2350 10/28/16 0109         Objective:   Vitals:   11/05/16 2055 11/06/16 0415 11/06/16 1037 11/06/16 1327  BP: (!) 128/54 (!) 126/50 127/60   Pulse: 70 80 99 (!) 101  Resp: _0 Temp: 98.1 F (36.7 C) 98.6 F (37 C) 97.6 F (36.4 C)   TempSrc: Oral Oral Oral   SpO2: 94% 94% 97% 95%  Weight:  117.9 kg (260 lb)    Height:        Wt Readings from Last 3 Encounters:  11/06/16 117.9 kg (260 lb)  09/22/16 122.5 kg (270 lb)  07/18/16 133.4 kg (294 lb)     Intake/Output Summary (Last 24 hours) at 11/06/16 1340 Last data filed at 11/06/16 1139  Gross per 24 hour  Intake              120 ml  Output              560 ml  Net             -440 ml     Physical Exam  Awake Alert, Oriented X 3, No new F.N deficits, Normal affect Martin.AT,PERRAL Supple Neck,No JVD, No cervical lymphadenopathy appriciated.  Symmetrical Chest wall movement, Good air movement bilaterally, CTAB, R sided Chest Tube RRR,No Gallops,Rubs or new Murmurs, No Parasternal Heave +ve B.Sounds, Abd Soft, No tenderness, No organomegaly appriciated, No rebound -  guarding or rigidity. No Cyanosis, Clubbing or edema, No new Rash or bruise      Data Review:    CBC  Recent Labs Lab 11/02/16 0600 11/03/16 0629 11/04/16 0311 11/05/16 0354 11/06/16 0329  WBC 30.8* 29.8* 25.7* 28.6* 21.4*  HGB 12.0* 12.8* 12.7* 12.8* 11.3*  HCT 37.7* 39.9 39.2 40.5 34.9*  PLT 314 342 359 416* 413*  MCV 91.3 92.1 92.7 92.3 90.6  MCH 29.1 29.6 30.0 29.2 29.4  MCHC 31.8 32.1 32.4 31.6 32.4  RDW 14.8 14.9 15.0 14.5 14.2    Chemistries   Recent Labs Lab 11/02/16 0600 11/03/16 0629 11/04/16 0311 11/05/16 0354 11/05/16 1334 11/06/16 0329  NA 142 144 143 137 137  --   K 3.7 3.9 3.7 4.0 3.7  --   CL 107 109 109 107 108  --   CO2 _1 21* 22  --   GLUCOSE 95 107* 98 88 105*  --   BUN 19 20 21* 18 15  --  CREATININE 1.00 1.04 1.09 1.02 1.00  --   CALCIUM 7.9* 8.1* 8.0* 8.2* 7.9*  --   MG 2.0 2.1 2.3 2.2  --  2.1  AST 26  --   --   --  28  --   ALT 16*  --   --   --  20  --   ALKPHOS 54  --   --   --  50  --   BILITOT 1.1  --   --   --  0.5  --    ------------------------------------------------------------------------------------------------------------------ No results for input(s): CHOL, HDL, LDLCALC, TRIG, CHOLHDL, LDLDIRECT in the last 72 hours.  Lab Results  Component Value Date   HGBA1C 5.5 07/18/2016   ------------------------------------------------------------------------------------------------------------------ No results for input(s): TSH, T4TOTAL, T3FREE, THYROIDAB in the last 72 hours.  Invalid input(s): FREET3 ------------------------------------------------------------------------------------------------------------------ No results for input(s): VITAMINB12, FOLATE, FERRITIN, TIBC, IRON, RETICCTPCT in the last 72 hours.  Coagulation profile No results for input(s): INR, PROTIME in the last 168 hours.  No results for input(s): DDIMER in the last 72 hours.  Cardiac Enzymes No results for input(s): CKMB, TROPONINI,  MYOGLOBIN in the last 168 hours.  Invalid input(s): CK ------------------------------------------------------------------------------------------------------------------ No results found for: BNP  Micro Results Recent Results (from the past 240 hour(s))  Urine culture     Status: None   Collection Time: 10/27/16 10:57 PM  Result Value Ref Range Status   Specimen Description URINE, RANDOM  Final   Special Requests NONE  Final   Culture NO GROWTH Performed at [facility]   Final   Report Status 10/29/2016 FINAL  Final  Blood Culture (routine x 2)     Status: None   Collection Time: 10/27/16 11:00 PM  Result Value Ref Range Status   Specimen Description RIGHT ANTECUBITAL  Final   Special Requests BOTTLES DRAWN AEROBIC AND ANAEROBIC 5CC EACH  Final   Culture NO GROWTH 5 DAYS  Final   Report Status 11/01/2016 FINAL  Final  Blood Culture (routine x 2)     Status: Abnormal (Preliminary result)   Collection Time: 10/27/16 11:22 PM  Result Value Ref Range Status   Specimen Description NECK RIGHT  Final   Special Requests BOTTLES DRAWN AEROBIC AND ANAEROBIC 5CC EACH  Final   Culture  Setup Time   Final    GRAM POSITIVE COCCI Gram Stain Report Called to,Read Back By and Verified With: FRAZIER,E. AT 2158 ON 10/28/2016 BY EVA ANAEROBIC BOTTLE  Performed at [place] CRITICAL RESULT CALLED TO, READ BACK BY AND VERIFIED WITH: J. [place], [place] [id number] BY Rhea Bleacher    Culture (A)  Final    STAPHYLOCOCCUS SPECIES (COAGULASE NEGATIVE) SUSCEPTIBILITIES TO FOLLOW Performed at [facility]    Report Status PENDING  Incomplete  Blood Culture ID Panel (Reflexed)     Status: Abnormal   Collection Time: 10/27/16 11:22 PM  Result Value Ref Range Status   Enterococcus species NOT DETECTED NOT DETECTED Final   Listeria monocytogenes NOT DETECTED NOT DETECTED Final   Staphylococcus species DETECTED (A) NOT DETECTED Final     Comment: CRITICAL RESULT CALLED TO, READ BACK BY AND VERIFIED WITH: J MARKEL,PHARMD AT 0737 BY S YARBROUGH    Staphylococcus aureus NOT DETECTED NOT DETECTED Final   Methicillin resistance DETECTED (A) NOT DETECTED Final    Comment: CRITICAL RESULT CALLED TO, READ BACK BY AND VERIFIED WITH: J MARKEL,PHARMD AT [place] BY Rhea Bleacher  Streptococcus species NOT DETECTED NOT DETECTED Final   Streptococcus agalactiae NOT DETECTED NOT DETECTED Final   Streptococcus pneumoniae NOT DETECTED NOT DETECTED Final   Streptococcus pyogenes NOT DETECTED NOT DETECTED Final   Acinetobacter baumannii NOT DETECTED NOT DETECTED Final   Enterobacteriaceae species NOT DETECTED NOT DETECTED Final   Enterobacter cloacae complex NOT DETECTED NOT DETECTED Final   Escherichia coli NOT DETECTED NOT DETECTED Final   Klebsiella oxytoca NOT DETECTED NOT DETECTED Final   Klebsiella pneumoniae NOT DETECTED NOT DETECTED Final   Proteus species NOT DETECTED NOT DETECTED Final   Serratia marcescens NOT DETECTED NOT DETECTED Final   Haemophilus influenzae NOT DETECTED NOT DETECTED Final   Neisseria meningitidis NOT DETECTED NOT DETECTED Final   Pseudomonas aeruginosa NOT DETECTED NOT DETECTED Final   Candida albicans NOT DETECTED NOT DETECTED Final   Candida glabrata NOT DETECTED NOT DETECTED Final   Candida krusei NOT DETECTED NOT DETECTED Final   Candida parapsilosis NOT DETECTED NOT DETECTED Final   Candida tropicalis NOT DETECTED NOT DETECTED Final    Comment: Performed at [facility]  MRSA PCR Screening     Status: Abnormal   Collection Time: 10/28/16  5:32 AM  Result Value Ref Range Status   MRSA by PCR POSITIVE (A) NEGATIVE Final    Comment:        The GeneXpert MRSA Assay (FDA approved for NASAL specimens only), is one component of a comprehensive MRSA colonization surveillance program. It is not intended to diagnose MRSA infection nor to guide or monitor treatment for MRSA infections. RESULT  CALLED TO, READ BACK BY AND VERIFIED WITH: Leverne Humbles RN AT 1646 10/28/16 BY [place]   Culture, respiratory (NON-Expectorated)     Status: None   Collection Time: 10/28/16  3:35 PM  Result Value Ref Range Status   Specimen Description TRACHEAL ASPIRATE  Final   Special Requests NONE  Final   Gram Stain   Final    ABUNDANT WBC PRESENT, PREDOMINANTLY PMN NO SQUAMOUS EPITHELIAL CELLS SEEN MODERATE GRAM POSITIVE COCCI IN PAIRS AND CHAINS    Culture MODERATE GROUP A STREP (S.PYOGENES) ISOLATED  Final   Report Status 10/31/2016 FINAL  Final  Culture, body fluid-bottle     Status: None   Collection Time: 10/31/16  3:15 PM  Result Value Ref Range Status   Specimen Description PLEURAL FLUID  Final   Special Requests RIGHT  Final   Culture NO GROWTH 5 DAYS  Final   Report Status 11/05/2016 FINAL  Final  Gram stain     Status: None   Collection Time: 10/31/16  3:15 PM  Result Value Ref Range Status   Specimen Description PLEURAL FLUID  Final   Special Requests RIGHT  Final   Gram Stain   Final    WBC PRESENT,BOTH PMN AND MONONUCLEAR GRAM POSITIVE COCCI IN PAIRS CYTOSPIN    Report Status 10/31/2016 FINAL  Final  Aerobic/Anaerobic Culture (surgical/deep wound)     Status: None   Collection Time: 10/31/16  3:20 PM  Result Value Ref Range Status   Specimen Description PLEURAL RIGHT  Final   Special Requests LUNG PEEL  Final   Gram Stain   Final    ABUNDANT WBC PRESENT, PREDOMINANTLY PMN FEW GRAM POSITIVE COCCI IN PAIRS    Culture   Final    RARE STAPHYLOCOCCUS SPECIES (COAGULASE NEGATIVE) NO ANAEROBES ISOLATED    Report Status 11/05/2016 FINAL  Final   Organism ID, Bacteria STAPHYLOCOCCUS SPECIES (COAGULASE NEGATIVE)  Final  Susceptibility   Staphylococcus species (coagulase negative) - MIC*    CIPROFLOXACIN <=0.5 SENSITIVE Sensitive     ERYTHROMYCIN <=0.25 SENSITIVE Sensitive     GENTAMICIN 1 SENSITIVE Sensitive     OXACILLIN 1 RESISTANT Resistant     TETRACYCLINE  <=1 SENSITIVE Sensitive     VANCOMYCIN <=0.5 SENSITIVE Sensitive     TRIMETH/SULFA 80 RESISTANT Resistant     CLINDAMYCIN <=0.25 SENSITIVE Sensitive     RIFAMPIN <=0.5 SENSITIVE Sensitive     Inducible Clindamycin NEGATIVE Sensitive     * RARE STAPHYLOCOCCUS SPECIES (COAGULASE NEGATIVE)    Radiology Reports Dg Chest 2 View  Result Date: 11/06/2016 CLINICAL DATA:  Falling in stable several days ago with history of empyema EXAM: CHEST  2 VIEW COMPARISON:  11/05/2016 FINDINGS: There has been interval removal of the inferior right chest tube. More superior chest tube remains. No pneumothorax is noted. Persistent right basilar changes are noted with fusion. Left lung remains clear. No acute bony abnormality is noted. IMPRESSION: No recurrent pneumothorax following chest tube removal. Persistent right basilar changes are noted. Electronically Signed   By: Inez Catalina M.D.   On: 11/06/2016 08:29   Ct Head Wo Contrast  Result Date: 10/28/2016 CLINICAL DATA:  Altered mental status. EXAM: CT HEAD WITHOUT CONTRAST TECHNIQUE: Contiguous axial images were obtained from the base of the skull through the vertex without intravenous contrast. COMPARISON:  CT scan of March 27, 2011. FINDINGS: Brain: No evidence of acute infarction, hemorrhage, hydrocephalus, extra-axial collection or mass lesion/mass effect. The appearance of the brain is stable compared to prior exam of 2012. Vascular: No hyperdense vessel or unexpected calcification. Skull: Normal. Negative for fracture or focal lesion. Sinuses/Orbits: Bilateral frontal, ethmoid, sphenoid and maxillary sinusitis is noted. Other: None. IMPRESSION: No acute intracranial abnormality seen. Pansinusitis is noted. This exam was interpreted in consultation with Dr. Lars Pinks, neuroradiologist. Electronically Signed   By: Marijo Conception, M.D.   On: 10/28/2016 15:11   Ct Chest Wo Contrast  Result Date: 10/28/2016 CLINICAL DATA:  Respiratory failure, fall. EXAM: CT  CHEST WITHOUT CONTRAST TECHNIQUE: Multidetector CT imaging of the chest was performed following the standard protocol without IV contrast. COMPARISON:  Radiograph of October 27, 2016. CT scan of June 10, 2014. FINDINGS: Cardiovascular: There is no evidence of thoracic aortic aneurysm. Mediastinum/Nodes: Endotracheal tube is in grossly good position. Nasogastric tube is seen passing through esophagus into distal stomach. Stable mildly enlarged mediastinal adenopathy is noted which most likely is reactive in etiology. Lungs/Pleura: No pneumothorax is noted. Left posterior basilar airspace opacity is noted concerning for atelectasis or pneumonia. Large partially loculated right pleural effusion is noted with associated atelectasis of right lower lobe. Right upper lobe airspace opacity is noted concerning for atelectasis or pneumonia. Upper Abdomen: No acute abnormality. Musculoskeletal: No chest wall mass or suspicious bone lesions identified. IMPRESSION: Endotracheal and nasogastric tubes are in grossly good position. Left lower lobe pneumonia or atelectasis is noted. Large partially loculated right pleural effusion is noted with associated atelectasis of right lower lobe. Right upper lobe pneumonia or atelectasis is noted. Electronically Signed   By: Marijo Conception, M.D.   On: 10/28/2016 15:31   Dg Chest Port 1 View  Result Date: 11/05/2016 CLINICAL DATA:  Productive cough . EXAM: PORTABLE CHEST 1 VIEW COMPARISON:  11/04/2016 . FINDINGS: Mediastinum appears normal. Heart size normal. Right lower lobe infiltrate consistent pneumonia. Low lung volumes. Small right pleural effusion. IMPRESSION: Right lower lobe infiltrate consistent pneumonia. Associated right  lower lobe atelectasis. Small right pleural effusion. Electronically Signed   By: Marcello Moores  Register   On: 11/05/2016 09:13   Dg Chest Port 1 View  Result Date: 11/04/2016 CLINICAL DATA:  40 year old male with history of right-sided chest tube. EXAM:  PORTABLE CHEST 1 VIEW COMPARISON:  Chest x-ray 11/03/2016. FINDINGS: Lung volumes are low. Right-sided chest tube remains in position with tip in medial aspect of the upper right hemithorax. A second chest tube is noted with tip in the medial aspect of the lower right hemithorax. Moderate volume of right-sided pleural fluid is again noted, most pronounced in the subpulmonic region, with an additional loculated area in the medial aspect of the right apex. Some associated areas of atelectasis and/or consolidation are noted in the right lung base, and to a lesser extent in the medial aspect of the left lower lobe. Overall, aeration appears unchanged. No left pleural effusion. No pneumothorax. No evidence of pulmonary edema. Heart size is normal. Upper mediastinal contours are distorted by patient's rotation to the right. IMPRESSION: 1. No significant change in the radiographic appearance the chest, as detailed above. Electronically Signed   By: Vinnie Langton M.D.   On: 11/04/2016 07:18   Dg Chest Port 1 View  Result Date: 11/03/2016 CLINICAL DATA:  Hypertension.  ETT. EXAM: PORTABLE CHEST 1 VIEW COMPARISON:  November 02, 2016 FINDINGS: The more medial and inferior right chest tubes are stable. The more lateral tube has been removed. No pneumothorax. Persistent decreased aeration and probable effusion on the right. No other interval changes or acute abnormalities. IMPRESSION: It appears that 1 of the 3 chest tubes has been removed. The other tubes are in stable position. Persistent diminished aeration on the right with probable effusion and underlying atelectasis. No other change. Electronically Signed   By: Dorise Bullion III M.D   On: 11/03/2016 08:05   Dg Chest Port 1 View  Result Date: 11/02/2016 CLINICAL DATA:  40 year old male status post right VIDEO ASSISTED THORACOSCOPY on 10/31/16 for early organizing right side empyema. Right lower lobe lung abscess. Initial encounter. EXAM: PORTABLE CHEST 1 VIEW  COMPARISON:  11/01/2016 and earlier. FINDINGS: Portable AP view at 0457 hours. Extubated. Enteric tube removed. Right IJ central line removed. Two right chest tubes remain and are stable. No pneumothorax identified. Stable Patchy and confluent opacity at the right lung base. Continued somewhat low lung volumes. Stable ventilation elsewhere. Stable cardiac size and mediastinal contours. IMPRESSION: 1. Extubated and enteric tube removed. 2. Stable right chest tubes with no pneumothorax. 3. Stable ventilation with Patchy and confluent residual right lung base opacity. Electronically Signed   By: Genevie Ann M.D.   On: 11/02/2016 08:01   Dg Chest Port 1 View  Result Date: 11/01/2016 CLINICAL DATA:  Empyema . EXAM: PORTABLE CHEST 1 VIEW COMPARISON:  10/31/2016 . FINDINGS: Endotracheal tube, NG tube, right IJ line, right chest tubes in stable position. Right pleural effusion has improved. Low lung volumes with basilar atelectasis and infiltrates. No pneumothorax. Mild right chest wall subcutaneous emphysema . IMPRESSION: 1. Lines and tubes including right chest tubes in stable position. No pneumothorax. Interim improvement of right pleural effusion. Mild right chest wall subcutaneous emphysema. 2.  Low lung volumes with basilar atelectasis infiltrates. Electronically Signed   By: Marcello Moores  Register   On: 11/01/2016 07:25   Dg Chest Port 1 View  Result Date: 10/31/2016 CLINICAL DATA:  Shortness of breath.  Endotracheal placement. EXAM: PORTABLE CHEST 1 VIEW COMPARISON:  10/30/2016 FINDINGS: Endotracheal tube has its  tip 2.5 cm above the carina. Nasogastric tube enters the abdomen. Right internal jugular central line tip is in the SVC above the right atrium. Right pleural effusion with right lower lobe volume loss and/or infiltrate persists. Patchy infiltrate persists within the left lung. Findings are similar to yesterday's study, possibly with mild worsening. IMPRESSION: Lines and tubes well positioned. Persistent  large effusion on the right with right lung volume loss and infiltrate. Left lung infiltrate. Findings may be slightly worsened. Electronically Signed   By: Nelson Chimes M.D.   On: 10/31/2016 07:17   Dg Chest Port 1 View  Result Date: 10/30/2016 CLINICAL DATA:  Encounter for central line placement. EXAM: PORTABLE CHEST 1 VIEW COMPARISON:  10/30/2016 FINDINGS: A new right IJ central line has been placed. Central line tip in the upper SVC region. Endotracheal tube is 4.4 cm above the carina. Nasogastric tube extends into the abdomen. There appears to be a large right pleural effusion with consolidation or airspace disease in the right lung. Stable interstitial densities in the left lung. Heart size is within normal limits and stable. Negative for a pneumothorax. IMPRESSION: Central line tip in the upper SVC region. No significant change in the pleural and parenchymal disease in the right chest. Negative for a pneumothorax. Electronically Signed   By: Markus Daft M.D.   On: 10/30/2016 15:25   Dg Chest Port 1 View  Result Date: 10/30/2016 CLINICAL DATA:  Central line placement EXAM: PORTABLE CHEST 1 VIEW COMPARISON:  Chest x-ray of 10/30/2016 and CT chest of 10/28/2006 2 FINDINGS: A venous sheath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

## 2016-11-06 NOTE — Progress Notes (Signed)
Patient placed on hospital black box CPAP machine with no complications. Nurse aware. Will continue to monitor pt.
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
## 2016-11-06 NOTE — Progress Notes (Signed)
Nutrition Follow Up  DOCUMENTATION CODES:   Obesity unspecified  INTERVENTION:    Ensure Enlive po BID, each supplement provides 350 kcal and 20 grams of protein  NEW NUTRITION DIAGNOSIS:   Increased nutrient needs related to acute illness as evidenced by estimated needs, ongoing  NEW GOAL:   Patient will meet greater than or equal to 90% of their needs, progressing   MONITOR:   PO intake, Supplement acceptance, Labs, Weight trends, Skin, I & O's  ASSESSMENT:   40 yo male with hx DM, HTN, OSA on cpap, admitted 11/25 with PNA and pleural effusion. Developed acute hypoxic respiratory failure and required intubation on 11/26.    Pt s/p VATS & drainage of empyema 11/29.  Patient extubated 11/30 >> TF discontinued. Speech Path following for dysphagia. PO intake currently 50% per flowsheets. Would benefit from oral nutrition supplements. CBG's [id number].  Diet Order:  DIET DYS 3 Room service appropriate? Yes; Fluid consistency: Nectar Thick  Skin:  Reviewed, no issues  Last BM:  12/4  Height:   Ht Readings from Last 1 Encounters:  10/28/16 6\' 3"  (1.905 m)    Weight:   Wt Readings from Last 1 Encounters:  11/06/16 260 lb (117.9 kg)    Ideal Body Weight:  89.1 kg  BMI:  Body mass index is 32.5 kg/m.  Estimated Nutritional Needs:   Kcal:  2000-2200  Protein:  110-120 gm   Fluid:  2.0-2.2 L  EDUCATION NEEDS:   No education needs identified at this time  Arthur Holms, RD, LDN Pager #: [phone] After-Hours Pager #: [phone]

## 2016-11-06 NOTE — Progress Notes (Signed)
Patient education completed for removal of chest tube they verbalised understanding, chest tube removed, well tolerated by patient will continue to monitor

## 2016-11-06 NOTE — Progress Notes (Signed)
Pharmacy Antibiotic Note  Eric Hayes is a 40 y.o. male admitted on 10/27/2016 with pneumonia and pleural effusion.  Chest tube removed today and CXR is negative for pneumothorax.  Patient continues on vancomycin and levofloxacin.   Plan for 21 days of treatment per ID.  Patient's renal function has been stable.  He remains afebrile and his WBC is improving.   Plan: Continue vanc 1250mg  IV Q12H Continue Levaquin 750mg  IV Q24H F/U renal fxn, C&S, clinical status Vanc trough tomorrow   Height: 6\' 3"  (190.5 cm) Weight: 260 lb (117.9 kg) IBW/kg (Calculated) : 84.5  Temp (24hrs), Avg:98.1 F (36.7 C), Min:97.6 F (36.4 C), Max:98.6 F (37 C)   Recent Labs Lab 11/02/16 0600 11/03/16 0629 11/03/16 1526 11/03/16 2236 11/04/16 0311 11/05/16 0354 11/05/16 1334 11/06/16 0329  WBC 30.8* 29.8*  --   --  25.7* 28.6*  --  21.4*  CREATININE 1.00 1.04  --   --  1.09 1.02 1.00  --   VANCOTROUGH  --   --  42* 25*  --   --   --   --     Estimated Creatinine Clearance: 136 mL/min (by C-G formula based on SCr of 1 mg/dL).    Allergies  Allergen Reactions  . Penicillins Hives and Swelling    AS A CHILD, has tolerated ceftriaxone and cephalexin in past.     Azactam 11/26 >> 11/27 Levaquin 11/26 >>  Vanc 11/26 >>       11/28 VT 8 (on 1g q12)       11/29 VT 20 (drawn appropriately)       12/2 VT = 25 (on 1250mg  q8, changed to q12)  11/25 BCx: 1/2 CoNS 11/25 UCx: no growth  11/26 TA: Group A strep  11/26 MRSA PCR: positive 11/29 pleural fluid: CoNS- R: ox & septra 12/4 BCx -    Markell Schrier D. Mina Marble, PharmD, BCPS Pager:  [phone] 11/06/2016, 1:29 PM

## 2016-11-06 NOTE — Progress Notes (Signed)
Patient ambulated 150 ft in the hallway using a walker and 2ltrs oxygen, well tolerated , patient now resting on the bed in his room , call light within reach will continue to monitor

## 2016-11-06 NOTE — Progress Notes (Signed)
Speech Language Pathology Treatment: Dysphagia  Patient Details Name: Eric Hayes MRN: [id number] DOB: [birth year] Today's Date: 11/06/2016 Time: [id number] SLP Time Calculation (min) (ACUTE ONLY): 16 min  Assessment / Plan / Recommendation Clinical Impression  F/u after yesterday's MBS. Mother present.  Reviewed results, recommendations.  Pt following precautions at Mod I level.  Demonstrated to pt/mother how to thicken liquids to nectar consistency; verbalized understanding.  Pt consumed nectar liquids with use of precautions and no s/s of aspiration.  We discussed temporary use of thickener and good swallowing prognosis.  SLP will follow.    HPI HPI: 40yo male with hx bipolar disorder, chronic pain, DM, previous hx DVT no longer on anticoagulation, HTN, OSA on CPAP, medication non-compliance presented 11/26 to [facility] with several days of fever, diarrhea and progressive dyspnea. Reportedly had a fall several days prior to admit where he struck his chest and was wearing ace-wrap on his chest on presentation. In ER he had progressively worsening WOB, hypoxia, hypotension and progressive AMS. He required intubation shortly after arrival to ER and started on vasopressors. Further w/u revealed AKI with Scr 2.2, lactate 4, nonAG metabolic acidosis.CXR 11/28 showed diffuse right lung airspace disease and left base atelectasis. Intubated 11/26. Extubated 11/30      SLP Plan  Continue with current plan of care     Recommendations  Diet recommendations: Dysphagia 3 (mechanical soft);Nectar-thick liquid Liquids provided via: Cup Medication Administration: Crushed with puree Supervision: Patient able to self feed Compensations: Minimize environmental distractions;Slow rate;Small sips/bites;Follow solids with liquid Postural Changes and/or Swallow Maneuvers: Seated upright 90 degrees                Oral Care Recommendations: Oral care BID Plan: Continue with current plan of  care       GO               Dillyn Joaquin L. Tivis Ringer, [place] CCC/SLP Pager [phone]  Juan Quam Laurice 11/06/2016, 1:53 PM

## 2016-11-07 ENCOUNTER — Inpatient Hospital Stay (HOSPITAL_COMMUNITY): Payer: Medicare Other

## 2016-11-07 LAB — GLUCOSE, CAPILLARY
GLUCOSE-CAPILLARY: 119 mg/dL — AB (ref 65–99)
GLUCOSE-CAPILLARY: 142 mg/dL — AB (ref 65–99)
GLUCOSE-CAPILLARY: 91 mg/dL (ref 65–99)
Glucose-Capillary: 108 mg/dL — ABNORMAL HIGH (ref 65–99)
Glucose-Capillary: 116 mg/dL — ABNORMAL HIGH (ref 65–99)
Glucose-Capillary: 117 mg/dL — ABNORMAL HIGH (ref 65–99)
Glucose-Capillary: 147 mg/dL — ABNORMAL HIGH (ref 65–99)
Glucose-Capillary: 148 mg/dL — ABNORMAL HIGH (ref 65–99)
Glucose-Capillary: 94 mg/dL (ref 65–99)

## 2016-11-07 LAB — CULTURE, BLOOD (ROUTINE X 2)

## 2016-11-07 LAB — VANCOMYCIN, TROUGH: Vancomycin Tr: 12 ug/mL — ABNORMAL LOW (ref 15–20)

## 2016-11-07 MED ORDER — VANCOMYCIN HCL 10 G IV SOLR
1750.0000 mg | Freq: Two times a day (BID) | INTRAVENOUS | Status: DC
  Administered 2016-11-07 – 2016-11-08 (×3): 1750 mg via INTRAVENOUS
  Filled 2016-11-07 (×4): qty 1750

## 2016-11-07 NOTE — Progress Notes (Signed)
Pharmacy Antibiotic Note  Eric Hayes is a 40 y.o. male admitted on 10/27/2016 with pneumonia and pleural effusion.  Chest tube removed 12/5 and CXR is negative for pneumothorax.  Patient continues on vancomycin and levofloxacin.   Plan for 21 days of treatment per ID. Today is day #10/21 abx. WBC 21.4 (coming down). AF,   Patient's renal function has been stable.  He remains afebrile and his WBC is improving. Vancomycin trough drawn on vancomycin 1250 mg IV q12 is 12 mcg/ml - below 15-20 goal for PNA.    Plan: Change to vanc 1750mg  IV Q12H - keeping q12 for outpt dosing convenience Continue Levaquin 750mg  IV Q24H F/U renal fxn, C&S, clinical status   Height: 6\' 3"  (190.5 cm) Weight: 269 lb 3.2 oz (122.1 kg) IBW/kg (Calculated) : 84.5  Temp (24hrs), Avg:98.3 F (36.8 C), Min:97.6 F (36.4 C), Max:98.9 F (37.2 C)   Recent Labs Lab 11/02/16 0600 11/03/16 0629  11/03/16 2236 11/04/16 0311 11/05/16 0354 11/05/16 1334 11/06/16 0329 11/07/16 0411  WBC 30.8* 29.8*  --   --  25.7* 28.6*  --  21.4*  --   CREATININE 1.00 1.04  --   --  1.09 1.02 1.00  --   --   VANCOTROUGH  --   --   < > 25*  --   --   --   --  12*  < > = values in this interval not displayed.  Estimated Creatinine Clearance: 138.2 mL/min (by C-G formula based on SCr of 1 mg/dL).    Allergies  Allergen Reactions  . Penicillins Hives and Swelling    AS A CHILD, has tolerated ceftriaxone and cephalexin in past.     Azactam 11/26 >> 11/27 Levaquin 11/26 >>  Vanc 11/26 >>       11/28 VT 8 (on 1g q12)       11/29 VT 20 (drawn appropriately)       12/2 VT = 25 (on 1250mg  q8, changed to q12)       12/5 VT = 12 (on 1250 q12, changed to 1750 q12)  11/25 BCx: 1/2 CoNS. Oxacillin R septra R 11/25 UCx: no growth  11/26 TA: Group A strep  11/26 MRSA PCR: positive 11/29 pleural fluid: CoNS- R: ox & septra 12/4 BCx - ng< 24 hrs  Eudelia Bunch, Pharm.D. [id number] 11/07/2016 7:48 AM

## 2016-11-07 NOTE — Progress Notes (Signed)
Rn ambulated patient at 10;30am with walker while patient was on Ra/ Patient taken off oxygen approximately  30min ago, pre-ambulation SpO2 was 92% at rest, sitting on edge of bed. About 2 minutes in oxygen saturation increased to 93%, maximum SPO2 was 94% about 4 minutes into walk. Patient ambulated from room to magazine stand in hallway. Pt reported feeling some dizziness and clammy, RN took patient back to room. CNA to get CBG. Patient HR increased to a max of 146 bpm. When RN took patient back to room, SPO2 was 94%

## 2016-11-07 NOTE — Progress Notes (Addendum)
      [address]       [place] [address]             [phone]      7 Days Post-Op Procedure(s) (LRB): VIDEO ASSISTED THORACOSCOPY (Right) Subjective: Pain at chest tube site.  Requesting adjustment to his Adderall  Objective: Vital signs in last 24 hours: Temp:  [97.6 F (36.4 C)-98.9 F (37.2 C)] 98.9 F (37.2 C) (12/06 0405) Pulse Rate:  [90-110] 90 (12/06 0405) Cardiac Rhythm: Sinus tachycardia (12/05 1953) Resp:  [18-22] 20 (12/06 0405) BP: (109-127)/(46-60) 124/46 (12/06 0405) SpO2:  [93 %-97 %] 94 % (12/06 0405) Weight:  [269 lb 3.2 oz (122.1 kg)] 269 lb 3.2 oz (122.1 kg) (12/06 0405)  Intake/Output from previous day: 12/05 0701 - 12/06 0700 In: 610 [P.O.:600; I.V.:10] Out: 1150 [Urine:1150]  General appearance: alert, cooperative and no distress Heart: regular rate and rhythm Lungs: diminished breath sounds bibasilar Abdomen: soft, non-tender; bowel sounds normal; no masses,  no organomegaly Wound: clean and dry  Lab Results:  Recent Labs  11/05/16 0354 11/06/16 0329  WBC 28.6* 21.4*  HGB 12.8* 11.3*  HCT 40.5 34.9*  PLT 416* 413*   BMET:  Recent Labs  11/05/16 0354 11/05/16 1334  NA 137 137  K 4.0 3.7  CL 107 108  CO2 21* 22  GLUCOSE 88 105*  BUN 18 15  CREATININE 1.02 1.00  CALCIUM 8.2* 7.9*    PT/INR: No results for input(s): LABPROT, INR in the last 72 hours. ABG    Component Value Date/Time   PHART 7.481 (H) 11/03/2016 0430   HCO3 25.7 11/03/2016 0430   TCO2 36 11/01/2016 1031   ACIDBASEDEF 0.8 10/29/2016 0425   O2SAT 90.0 11/03/2016 0430   CBG (last 3)   Recent Labs  11/06/16 1615 11/07/16 0013 11/07/16 0403  GLUCAP 124* 94 116*    Assessment/Plan: S/P Procedure(s) (LRB): VIDEO ASSISTED THORACOSCOPY (Right)  1. Chest tube removed yesterday- CXR with post surgical changes, right basilar atelectasis 2. ID- on Vanc, OR cultures remain negative 3. Dsyphagia- on Dysphagia 3 diet per SLP 4. Dispo-  patient stable, wounds are clean,  will see patient in office in 2 weeks time... Please call if further assistance   LOS: 10 days    BARRETT, ERIN 11/07/2016 Patient seen and examined, agree with above Continue with IS and flutter as there is still some consolidation in the RLL (as expected)  Remo Lipps C. Roxan Hockey, MD [facility] [phone]

## 2016-11-07 NOTE — Progress Notes (Signed)
Patient ID: Eric Hayes, male   DOB: March 29, 1976, 40 y.o.   MRN: [id number]    PROGRESS NOTE    JDYN PARKERSON  SUP:[id number] DOB: 1976-07-01 DOA: 10/27/2016  PCP: Crisoforo Oxford, PA-C   Brief Narrative:  40 yo male with hx bipolar disorder, chronic pain, DM, previous hx DVT no longer on anticoagulation, HTN, OSA on CPAP, medication non-compliance presented 11/26 to [facility] with several days of fever, diarrhea and progressive dyspnea. Reportedly had a fall several days prior to admit where he struck his chest and was wearing ace-wrap on his chest on presentation. In ER he had progressively worsening WOB, hypoxia, hypotension and progressive AMS. He required intubation shortly after arrival to ER and started on vasopressors. Further w/u revealed AKI with Scr 2.2, lactate 4, nonAG metabolic acidosis.   He was diagnosed with pneumonia with right-sided loculated effusion, this likely was parapneumonic, he was seen by pulmonary critical care along with cardiothoracic surgery underwent right-sided chest tube placement, was stabilized and transferred to [facility] on 11/05/2016. Currently on dys III diet.   SIGNIFICANT EVENTS: 11/26: admitted and intubated due to [place], acute resp failure and hypotension 11/28 Did well on minimal pressure support 11/29 VAT   Assessment & Plan:  Acute hypoxic respiratory failure  - due to community-acquired pneumonia complicated by right-sided parapneumonic effusion  - requiring chest tube placement by cardiothoracic surgery on 10/31/2016 after a VAT procedure. - currently on vancomycin, significant leukocytosis but appears nontoxic - ID team following, current recommendations are to continue vancomycin - requested patient to continue using incentive spirometer and flutter valve, increase activity titrate off oxygen - chest tube removed 12/05 - today is day #10/21 of vancomycin   Septic shock - secondary to the above  - resolved    Toxic encephalopathy, metabolic secondary to the above - resolved   Dysphagia  - passed swallow eval, dys III diet now recommended - pt tolerating well so far .  Constipation - had BM yesterday   History of gout - resume home allopurinol   COPD - Stable no acute issues. Supportive care.  Leukocytosis - Likely due to principal problem - trending down - CBC in Am  DVT prophylaxis: Heparin SQ Code Status: Full  Family Communication: Patient at bedside  Disposition Plan: Home in 1-2 days   Consultants:   CTS  PCCM transferred care to [facility]   ID  LINES/TUBES:  ETT 11/26>>>11/30  R IJ cortis (EDP) 11/26>>>11/28  CVC RIJ 11/28>>  PIVs 11/26>>  Foley and OGT 11/25>> 12/4  Chest tube, right 11/29>> 12/05  STUDIES:  CT chest 11/26 --> LLL and RUL pneumonia or atelectasis. Large partially loculated right pleural effusion.   2D echo 11/26 --> EF 60-65% without structural or functional abnormalities  LE doppler 11/27 --> without DVT  CULTURES:  BC x 2 11/26>>>coag neg staph in 1 out of 2.  Urine 11/26>>>NGTD  Sputum 11/26>>>Mod Group A Strep  MRSA PCR positive  Pleural fluid Cx 11/29>>NGTD. GS GPC in chains  Subjective: No events overnight, still with exertional dyspnea, unable to titrate off oxygen.   Objective: Vitals:   11/07/16 1036 11/07/16 1208 11/07/16 1355 11/07/16 1450  BP:  (!) 118/52 (!) 128/59   Pulse:  (!) 115 (!) 108 (!) 35  Resp:   17 18  Temp:   98.3 F (36.8 C)   TempSrc:   Oral   SpO2: 92% 91% 94% 95%  Weight:      Height:  Intake/Output Summary (Last 24 hours) at 11/07/16 1613 Last data filed at 11/07/16 1300  Gross per 24 hour  Intake              610 ml  Output             1200 ml  Net             -590 ml   Filed Weights   11/05/16 0338 11/06/16 0415 11/07/16 0405  Weight: 116.3 kg (256 lb 4.8 oz) 117.9 kg (260 lb) 122.1 kg (269 lb 3.2 oz)    Examination:  General exam: Appears calm and  comfortable  Respiratory system: Clear to auscultation. Respiratory effort normal. Cardiovascular system: S1 & S2 heard, RRR. No JVD, murmurs, rubs, gallops or clicks. No pedal edema. Gastrointestinal system: Abdomen is nondistended, soft and nontender. No organomegaly or masses felt.  Central nervous system: Alert and oriented. No focal neurological deficits.  Data Reviewed: I have personally reviewed following labs and imaging studies  CBC:  Recent Labs Lab 11/02/16 0600 11/03/16 0629 11/04/16 0311 11/05/16 0354 11/06/16 0329  WBC 30.8* 29.8* 25.7* 28.6* 21.4*  HGB 12.0* 12.8* 12.7* 12.8* 11.3*  HCT 37.7* 39.9 39.2 40.5 34.9*  MCV 91.3 92.1 92.7 92.3 90.6  PLT 314 342 359 416* 166*   Basic Metabolic Panel:  Recent Labs Lab 11/01/16 0400 11/02/16 0600 11/03/16 0629 11/04/16 0311 11/05/16 0354 11/05/16 1334 11/06/16 0329  NA 142 142 144 143 137 137  --   K 4.6 3.7 3.9 3.7 4.0 3.7  --   CL 106 107 109 109 107 108  --   CO2 _0 21* 22  --   GLUCOSE 100* 95 107* 98 88 105*  --   BUN 23* 19 20 21* 18 15  --   CREATININE 0.99 1.00 1.04 1.09 1.02 1.00  --   CALCIUM 7.7* 7.9* 8.1* 8.0* 8.2* 7.9*  --   MG 1.9 2.0 2.1 2.3 2.2  --  2.1  PHOS 3.7 3.6 4.5 4.2 4.1  --   --    Liver Function Tests:  Recent Labs Lab 11/01/16 0830 11/02/16 0600 11/05/16 1334  AST  --  26 28  ALT  --  16* 20  ALKPHOS  --  54 50  BILITOT  --  1.1 0.5  PROT  --  5.9* 7.3  ALBUMIN 1.7* 1.9* 1.9*   CBG:  Recent Labs Lab 11/07/16 0013 11/07/16 0403 11/07/16 0807 11/07/16 1039 11/07/16 1142  GLUCAP 94 116* 108* 147* 148*   Urine analysis:    Component Value Date/Time   COLORURINE YELLOW 10/27/2016 [place] 10/27/2016 2257   LABSPEC >1.030 (H) 10/27/2016 2257   PHURINE 5.5 10/27/2016 2257   GLUCOSEU NEGATIVE 10/27/2016 2257   HGBUR TRACE (A) 10/27/2016 2257   BILIRUBINUR NEGATIVE 10/27/2016 2257   BILIRUBINUR n 12/20/2015 1342   KETONESUR TRACE (A)  10/27/2016 2257   PROTEINUR 30 (A) 10/27/2016 2257   UROBILINOGEN negative 12/20/2015 1342   UROBILINOGEN 0.2 08/01/2013 2024   NITRITE NEGATIVE 10/27/2016 2257   LEUKOCYTESUR NEGATIVE 10/27/2016 2257   Recent Results (from the past 240 hour(s))  Culture, body fluid-bottle     Status: None   Collection Time: 10/31/16  3:15 PM  Result Value Ref Range Status   Specimen Description PLEURAL FLUID  Final   Special Requests RIGHT  Final   Culture NO GROWTH 5 DAYS  Final   Report Status 11/05/2016 FINAL  Final  Gram stain     Status: None   Collection Time: 10/31/16  3:15 PM  Result Value Ref Range Status   Specimen Description PLEURAL FLUID  Final   Special Requests RIGHT  Final   Gram Stain   Final    WBC PRESENT,BOTH PMN AND MONONUCLEAR GRAM POSITIVE COCCI IN PAIRS CYTOSPIN    Report Status 10/31/2016 FINAL  Final  Aerobic/Anaerobic Culture (surgical/deep wound)     Status: None   Collection Time: 10/31/16  3:20 PM  Result Value Ref Range Status   Specimen Description PLEURAL RIGHT  Final   Special Requests LUNG PEEL  Final   Gram Stain   Final    ABUNDANT WBC PRESENT, PREDOMINANTLY PMN FEW GRAM POSITIVE COCCI IN PAIRS    Culture   Final    RARE STAPHYLOCOCCUS SPECIES (COAGULASE NEGATIVE) NO ANAEROBES ISOLATED    Report Status 11/05/2016 FINAL  Final   Organism ID, Bacteria STAPHYLOCOCCUS SPECIES (COAGULASE NEGATIVE)  Final      Susceptibility   Staphylococcus species (coagulase negative) - MIC*    CIPROFLOXACIN <=0.5 SENSITIVE Sensitive     ERYTHROMYCIN <=0.25 SENSITIVE Sensitive     GENTAMICIN 1 SENSITIVE Sensitive     OXACILLIN 1 RESISTANT Resistant     TETRACYCLINE <=1 SENSITIVE Sensitive     VANCOMYCIN <=0.5 SENSITIVE Sensitive     TRIMETH/SULFA 80 RESISTANT Resistant     CLINDAMYCIN <=0.25 SENSITIVE Sensitive     RIFAMPIN <=0.5 SENSITIVE Sensitive     Inducible Clindamycin NEGATIVE Sensitive     * RARE STAPHYLOCOCCUS SPECIES (COAGULASE NEGATIVE)  Culture, blood  (Routine X 2) w Reflex to ID Panel     Status: None (Preliminary result)   Collection Time: 11/05/16  5:00 PM  Result Value Ref Range Status   Specimen Description BLOOD LEFT WRIST  Final   Special Requests BOTTLES DRAWN AEROBIC AND ANAEROBIC 5CC  Final   Culture NO GROWTH < 24 HOURS  Final   Report Status PENDING  Incomplete      Radiology Studies: Dg Chest 2 View  Result Date: 11/07/2016 CLINICAL DATA:  Empyema.  Shortness of breath. EXAM: CHEST  2 VIEW COMPARISON:  11/06/2016. FINDINGS: Right PICC line stable position. Heart size normal. Right lower lobe atelectasis and consolidation. Right pleural effusion is stable. Mild left mid lung field subsegmental atelectasis again noted . No evidence of pneumothorax following recent chest tube removal. IMPRESSION: 1. Right PICC line stable position. 2. Right lower lobe atelectasis and consolidation again noted. Stable right pleural effusion. No evidence of pneumothorax post chest tube removal. Electronically Signed   By: Marcello Moores  Register   On: 11/07/2016 08:38   Dg Chest 2 View  Result Date: 11/06/2016 CLINICAL DATA:  Falling in stable several days ago with history of empyema EXAM: CHEST  2 VIEW COMPARISON:  11/05/2016 FINDINGS: There has been interval removal of the inferior right chest tube. More superior chest tube remains. No pneumothorax is noted. Persistent right basilar changes are noted with fusion. Left lung remains clear. No acute bony abnormality is noted. IMPRESSION: No recurrent pneumothorax following chest tube removal. Persistent right basilar changes are noted. Electronically Signed   By: Inez Catalina M.D.   On: 11/06/2016 08:29   Dg Chest Port 1 View  Result Date: 11/06/2016 CLINICAL DATA:  Status post right chest tube removal today. EXAM: PORTABLE CHEST 1 VIEW COMPARISON:  PA and lateral chest 11/06/2016. FINDINGS: Right chest tube has been removed. No pneumothorax. Right PICC remains in place.  Right pleural effusion and basilar  airspace disease are unchanged. No new abnormality. IMPRESSION: Negative for pneumothorax after right chest tube removal. No change in right basilar airspace disease. Electronically Signed   By: Inge Rise M.D.   On: 11/06/2016 15:15      Scheduled Meds: . amphetamine-dextroamphetamine  20 mg Oral BID  . bisacodyl  10 mg Oral Daily  . budesonide (PULMICORT) nebulizer solution  0.5 mg Nebulization BID  . chlorhexidine gluconate (MEDLINE KIT)  15 mL Mouth Rinse BID  . docusate  100 mg Per Tube Once  . famotidine  20 mg Oral BID  . feeding supplement (ENSURE ENLIVE)  237 mL Oral BID BM  . heparin subcutaneous  5,000 Units Subcutaneous Q8H  . insulin aspart  0-15 Units Subcutaneous Q4H  . levalbuterol  0.63 mg Nebulization TID  . magnesium hydroxide  30 mL Per Tube Once  . mouth rinse  15 mL Mouth Rinse QID  . senna-docusate  1 tablet Oral QHS  . vancomycin  1,750 mg Intravenous Q12H   Continuous Infusions: . sodium chloride 10 mL/hr at 11/04/16 1616     LOS: 10 days   Time spent: 20 minutes   Faye Ramsay, MD [facility] Pager [phone]  If 7PM-7AM, please contact night-coverage [email or web address] Password [email or web address] 11/07/2016, 4:13 PM

## 2016-11-07 NOTE — Progress Notes (Signed)
Physical Therapy Treatment Patient Details Name: Eric Hayes MRN: [id number] DOB: 04/16/1976 Today's Date: 11/07/2016    History of Present Illness 40 yo admitted to [facility] with NSTEMI and VDRF with intubation 11/26-11/30, empyema s/p VATS with CT. PMHx: bipolar, chronic back pain, DM, HTN, OSA    PT Comments    Upon arrival, patient reported feeling dizzy and that he would try to ambulate but was not sure how well he would do. When the patient stood from EOB, he reported increased dizziness and sweating. After being instructed to march in place, patient still was symptomatic. After sitting in his chair and reclining with legs raised, symptoms lessened and BP increased again. Patient expressed interest in walking later, and said that his wife would walk with him. Will continue to follow.   BP sitting EOB: 118/52 BP standing: 98/53 BP standing marches: 95/60 BP seated and reclined: 116/61  HR throughout session: 115-126 O2Sat throughout session, room air: 91-95%    Follow Up Recommendations  Home health PT;Supervision - Intermittent     Equipment Recommendations       Recommendations for Other Services       Precautions / Restrictions Precautions Precautions: Fall Restrictions Weight Bearing Restrictions: No    Mobility  Bed Mobility Overal bed mobility: Modified Independent Bed Mobility: Rolling;Sidelying to Sit Rolling: Supervision Sidelying to sit: HOB elevated;Supervision       General bed mobility comments: patient moved from lying in bed to EOB before PT could dress in gown/gloves.   Transfers Overall transfer level: Needs assistance Equipment used: Rolling walker (2 wheeled) Transfers: Sit to/from Stand Sit to Stand: Min guard         General transfer comment: Patient cued for hand placement upon rising. Patient felt increasingly dizzy upon standing, and had orthostatic hypotension upon BP assessment.   Ambulation/Gait Ambulation/Gait  assistance:  (unable to ambulate today due to orthostatic hypotension accompanied by patient feeling dizzy and clammy. Patient limited to 3 steps to chair. )               Stairs            Wheelchair Mobility    Modified Rankin (Stroke Patients Only)       Balance Overall balance assessment: Needs assistance Sitting-balance support: Feet supported Sitting balance-Leahy Scale: Good Sitting balance - Comments: Patient sat for 1-2 minutes without PT truncal assistance with feet on floor.    Standing balance support: Bilateral upper extremity supported;During functional activity Standing balance-Leahy Scale: Fair Standing balance comment: Patient was dizzy and needed UE support of RW for support.                     Cognition Arousal/Alertness: Awake/alert Behavior During Therapy: Flat affect Overall Cognitive Status: Within Functional Limits for tasks assessed                      Exercises      General Comments        Pertinent Vitals/Pain Pain Assessment: No/denies pain    Home Living                      Prior Function            PT Goals (current goals can now be found in the care plan section) Acute Rehab PT Goals PT Goal Formulation: With patient Time For Goal Achievement: 11/21/16 Potential to Achieve Goals: Good Progress towards PT goals:  PT to reassess next treatment (patient unable to ambulate and progress mobility today )    Frequency    Min 3X/week      PT Plan Current plan remains appropriate    Co-evaluation             End of Session Equipment Utilized During Treatment: Gait belt;Oxygen Activity Tolerance: Treatment limited secondary to medical complications (Comment) (orthostatic hypotension, leading to dizziness, sweating) Patient left: in chair;with chair alarm set;with call bell/phone within reach     Time: 1136-1158 PT Time Calculation (min) (ACUTE ONLY): 22 min  Charges:  $Therapeutic  Activity: 8-22 mins                    G Codes:      Metzli Pollick 2016/12/01, 1:12 PM Laylonie Marzec SPT [id number]

## 2016-11-07 NOTE — Progress Notes (Signed)
Giving pm meds and pt./wife refused Adderall- stated it's too late to take; pt. wants med given around 8am and 3pm daily.

## 2016-11-08 ENCOUNTER — Inpatient Hospital Stay (HOSPITAL_COMMUNITY): Payer: Medicare Other

## 2016-11-08 DIAGNOSIS — Z95828 Presence of other vascular implants and grafts: Secondary | ICD-10-CM

## 2016-11-08 DIAGNOSIS — R404 Transient alteration of awareness: Secondary | ICD-10-CM

## 2016-11-08 DIAGNOSIS — Z978 Presence of other specified devices: Secondary | ICD-10-CM

## 2016-11-08 DIAGNOSIS — J869 Pyothorax without fistula: Secondary | ICD-10-CM

## 2016-11-08 LAB — BASIC METABOLIC PANEL
ANION GAP: 8 (ref 5–15)
BUN: 8 mg/dL (ref 6–20)
CHLORIDE: 105 mmol/L (ref 101–111)
CO2: 22 mmol/L (ref 22–32)
Calcium: 7.7 mg/dL — ABNORMAL LOW (ref 8.9–10.3)
Creatinine, Ser: 0.9 mg/dL (ref 0.61–1.24)
GFR calc non Af Amer: >60 mL/min (ref >60)
Glucose, Bld: 92 mg/dL (ref 65–99)
POTASSIUM: 3.4 mmol/L — AB (ref 3.5–5.1)
Sodium: 135 mmol/L (ref 135–145)

## 2016-11-08 LAB — CBC
HEMATOCRIT: 31.8 % — AB (ref 39.0–52.0)
HEMOGLOBIN: 10.2 g/dL — AB (ref 13.0–17.0)
MCH: 29.1 pg (ref 26.0–34.0)
MCHC: 32.1 g/dL (ref 30.0–36.0)
MCV: 90.9 fL (ref 78.0–100.0)
Platelets: 485 10*3/uL — ABNORMAL HIGH (ref 150–400)
RBC: 3.5 MIL/uL — AB (ref 4.22–5.81)
RDW: 14.1 % (ref 11.5–15.5)
WBC: 17.5 10*3/uL — AB (ref 4.0–10.5)

## 2016-11-08 LAB — GLUCOSE, CAPILLARY
GLUCOSE-CAPILLARY: 99 mg/dL (ref 65–99)
GLUCOSE-CAPILLARY: 100 mg/dL — AB (ref 65–99)
Glucose-Capillary: 99 mg/dL (ref 65–99)
Glucose-Capillary: 139 mg/dL — ABNORMAL HIGH (ref 65–99)

## 2016-11-08 MED ORDER — SENNOSIDES-DOCUSATE SODIUM 8.6-50 MG PO TABS: 1.0000 | ORAL_TABLET | Freq: Two times a day (BID) | ORAL | 0 refills | Status: DC

## 2016-11-08 MED ORDER — VANCOMYCIN HCL 10 G IV SOLR: 1750.0000 mg | Freq: Two times a day (BID) | INTRAVENOUS | 0 refills | Status: DC

## 2016-11-08 MED ORDER — COLCHICINE 0.6 MG PO TABS: 0.6000 mg | ORAL_TABLET | Freq: Every day | ORAL | 0 refills | Status: DC | PRN

## 2016-11-08 MED ORDER — FAMOTIDINE 40 MG/5ML PO SUSR: 20.0000 mg | Freq: Two times a day (BID) | ORAL | 2 refills | Status: DC

## 2016-11-08 MED ORDER — LEVALBUTEROL HCL 0.63 MG/3ML IN NEBU: 0.6300 mg | INHALATION_SOLUTION | Freq: Four times a day (QID) | RESPIRATORY_TRACT | 1 refills | Status: DC | PRN

## 2016-11-08 MED ORDER — BUDESONIDE 0.5 MG/2ML IN SUSP: 0.5000 mg | Freq: Two times a day (BID) | RESPIRATORY_TRACT | 0 refills | Status: DC

## 2016-11-08 MED ORDER — ALPRAZOLAM 2 MG PO TABS: 2.0000 mg | ORAL_TABLET | Freq: Three times a day (TID) | ORAL | 0 refills | Status: AC | PRN

## 2016-11-08 MED ORDER — POTASSIUM CHLORIDE CRYS ER 20 MEQ PO TBCR
40.0000 meq | EXTENDED_RELEASE_TABLET | Freq: Once | ORAL | Status: AC
  Administered 2016-11-08: 40 meq via ORAL
  Filled 2016-11-08: qty 2

## 2016-11-08 MED ORDER — COLCHICINE 0.6 MG PO TABS
0.6000 mg | ORAL_TABLET | Freq: Two times a day (BID) | ORAL | Status: DC
  Administered 2016-11-08: 0.6 mg via ORAL
  Filled 2016-11-08: qty 1

## 2016-11-08 MED ORDER — HEPARIN SOD (PORK) LOCK FLUSH 100 UNIT/ML IV SOLN: 250.0000 [IU] | INTRAVENOUS | Status: DC | PRN

## 2016-11-08 MED ORDER — OXYCODONE HCL 5 MG PO TABS: 5.0000 mg | ORAL_TABLET | ORAL | 0 refills | Status: DC | PRN

## 2016-11-08 NOTE — Discharge Summary (Addendum)
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 Pharyngeal- Puree NT Pharyngeal -- Pharyngeal- Mechanical Soft -- Pharyngeal -- Pharyngeal- Regular -- Pharyngeal -- Pharyngeal- Multi-consistency -- Pharyngeal -- Pharyngeal- Pill -- Pharyngeal -- Pharyngeal Comment --  CHL IP CERVICAL ESOPHAGEAL PHASE 11/05/2016 Cervical Esophageal Phase WFL Pudding Teaspoon -- Pudding Cup -- Honey Teaspoon -- Honey Cup -- Nectar Teaspoon -- Nectar Cup -- Nectar Straw -- Thin Teaspoon -- Thin Cup -- Thin Straw -- Puree -- Mechanical Soft -- Regular -- Multi-consistency -- Pill -- Cervical Esophageal Comment -- No flowsheet data found. Happi Overton 11/05/2016, 4:18 PM                Discharge Exam: Vitals:   11/07/16 2100 11/08/16 0538  BP: (!) 114/50 132/64  Pulse: (!) 110 (!) 109  Resp: 18 18  Temp: 99.4 F (37.4 C) 98.2 F (36.8 C)   Vitals:   11/07/16 1355 11/07/16 1450 11/07/16 2100 11/08/16 0538  BP: (!) 128/59  (!) 114/50 132/64  Pulse: (!) 108 (!) 35 (!) 110 (!) 109  Resp: 17 18 18 18   Temp: 98.3 F (36.8 C)  99.4 F (37.4 C) 98.2 F (36.8 C)  TempSrc: Oral  Oral Oral  SpO2: 94% 95% 93% 93%  Weight:    118.2 kg (260 lb 8 oz)  Height:         General: Pt is alert, follows commands appropriately, not in acute distress Cardiovascular: Regular rate and rhythm, S1/S2 +, no murmurs, no rubs, no gallops Respiratory: Clear to auscultation bilaterally, no wheezing, no crackles, no rhonchi Abdominal: Soft, non tender, non distended, bowel sounds +, no guarding  Discharge Instructions  Discharge Instructions    Diet - low sodium heart healthy    Complete by:  As directed    Increase activity slowly    Complete by:  As directed        Medication List    STOP taking these medications   furosemide 20 MG tablet Commonly known as:  LASIX     TAKE these medications   allopurinol 100 MG tablet Commonly known as:  ZYLOPRIM TAKE 1 TABLET(100 MG) BY MOUTH DAILY   alprazolam 2 MG tablet Commonly known as:  XANAX Take 1 tablet (2 mg total) by mouth 3 (three) times daily as needed for anxiety. What changed:  when to take this   amphetamine-dextroamphetamine 20 MG tablet Commonly known as:  ADDERALL Take 30 mg by  mouth 2 (two) times daily.   Azelastine-Fluticasone 137-50 MCG/ACT Susp Commonly known as:  DYMISTA Place 2 sprays into the nose 2 (two) times daily.   budesonide 0.5 MG/2ML nebulizer solution Commonly known as:  PULMICORT Take 2 mLs (0.5 mg total) by nebulization 2 (two) times daily.   colchicine 0.6 MG tablet Take 1 tablet (0.6 mg total) by mouth daily as needed.   diclofenac 75 MG EC tablet Commonly known as:  VOLTAREN Take 1 tablet (75 mg total) by mouth 2 (two) times daily.   famotidine 40 MG/5ML suspension Commonly known as:  PEPCID Take 2.5 mLs (20 mg total) by mouth 2 (two) times daily.   levalbuterol 0.63 MG/3ML nebulizer solution Commonly known as:  XOPENEX Take 3 mLs (0.63 mg total) by nebulization every 6 (six) hours as needed for wheezing or shortness of breath.   oxyCODONE 5 MG immediate release tablet Commonly known as:  Oxy IR/ROXICODONE Take 1-2 tablets (5-10 mg total) by mouth every  4 (four) hours as needed for moderate pain or severe pain.   senna-docusate 8.6-50 MG tablet Commonly known as:  Senokot-S Take 1 tablet by mouth 2 (two) times daily.   vancomycin 1,750 mg in sodium chloride 0.9 % 500 mL Inject 1,750 mg into the vein every 12 (twelve) hours. Continue taking until 11/18/2016, which will complete total 21 days of antibiotic therapy.      Follow-up Information    Melrose Nakayama, MD Follow up on 11/19/2016.   Specialty:  Cardiothoracic Surgery Why:  PA/LAT CXR to be taken (at [facility] which is in the same building as Dr. Leonarda Salon office) on at 1:30 pm;Appointment time is at 2:00 pm Contact information: [address] [place] [address] [place], [place], PA-C Follow up.   Specialty:  Family Medicine Contact information: [place] [address] [phone]        Faye Ramsay, MD Follow up.   Specialty:  Internal Medicine Why:  call me with questions  [phone] Contact information: [address] [place] [address] [phone]            The results of significant diagnostics from this hospitalization (including imaging, microbiology, ancillary and laboratory) are listed below for reference.     Microbiology: Recent Results (from the past 240 hour(s))  Culture, body fluid-bottle     Status: None   Collection Time: 10/31/16  3:15 PM  Result Value Ref Range Status   Specimen Description PLEURAL FLUID  Final   Special Requests RIGHT  Final   Culture NO GROWTH 5 DAYS  Final   Report Status 11/05/2016 FINAL  Final  Gram stain     Status: None   Collection Time: 10/31/16  3:15 PM  Result Value Ref Range Status   Specimen Description PLEURAL FLUID  Final   Special Requests RIGHT  Final   Gram Stain   Final    WBC PRESENT,BOTH PMN AND MONONUCLEAR GRAM POSITIVE COCCI IN PAIRS CYTOSPIN    Report Status 10/31/2016 FINAL   Final  Aerobic/Anaerobic Culture (surgical/deep wound)     Status: None   Collection Time: 10/31/16  3:20 PM  Result Value Ref Range Status   Specimen Description PLEURAL RIGHT  Final   Special Requests LUNG PEEL  Final   Gram Stain   Final    ABUNDANT WBC PRESENT, PREDOMINANTLY PMN FEW GRAM POSITIVE COCCI IN PAIRS    Culture  Final    RARE STAPHYLOCOCCUS SPECIES (COAGULASE NEGATIVE) NO ANAEROBES ISOLATED    Report Status 11/05/2016 FINAL  Final   Organism ID, Bacteria STAPHYLOCOCCUS SPECIES (COAGULASE NEGATIVE)  Final      Susceptibility   Staphylococcus species (coagulase negative) - MIC*    CIPROFLOXACIN <=0.5 SENSITIVE Sensitive     ERYTHROMYCIN <=0.25 SENSITIVE Sensitive     GENTAMICIN 1 SENSITIVE Sensitive     OXACILLIN 1 RESISTANT Resistant     TETRACYCLINE <=1 SENSITIVE Sensitive     VANCOMYCIN <=0.5 SENSITIVE Sensitive     TRIMETH/SULFA 80 RESISTANT Resistant     CLINDAMYCIN <=0.25 SENSITIVE Sensitive     RIFAMPIN <=0.5 SENSITIVE Sensitive     Inducible Clindamycin NEGATIVE Sensitive     * RARE STAPHYLOCOCCUS SPECIES (COAGULASE NEGATIVE)  Culture, blood (Routine X 2) w Reflex to ID Panel     Status: None (Preliminary result)   Collection Time: 11/05/16  5:00 PM  Result Value Ref Range Status   Specimen Description BLOOD LEFT WRIST  Final   Special Requests BOTTLES DRAWN AEROBIC AND ANAEROBIC 5CC  Final   Culture NO GROWTH 3 DAYS  Final   Report Status PENDING  Incomplete     Labs: Basic Metabolic Panel:  Recent Labs Lab 11/02/16 0600 11/03/16 0629 11/04/16 0311 11/05/16 0354 11/05/16 1334 11/06/16 0329 11/08/16 0447  NA 142 144 143 137 137  --  135  K 3.7 3.9 3.7 4.0 3.7  --  3.4*  CL 107 109 109 107 108  --  105  CO2 23 24 24  21* 22  --  22  GLUCOSE 95 107* 98 88 105*  --  92  BUN 19 20 21* 18 15  --  8  CREATININE 1.00 1.04 1.09 1.02 1.00  --  0.90  CALCIUM 7.9* 8.1* 8.0* 8.2* 7.9*  --  7.7*  MG 2.0 2.1 2.3 2.2  --  2.1  --   PHOS 3.6 4.5 4.2 4.1   --   --   --    Liver Function Tests:  Recent Labs Lab 11/02/16 0600 11/05/16 1334  AST 26 28  ALT 16* 20  ALKPHOS 54 50  BILITOT 1.1 0.5  PROT 5.9* 7.3  ALBUMIN 1.9* 1.9*   No results for input(s): LIPASE, AMYLASE in the last 168 hours. No results for input(s): AMMONIA in the last 168 hours. CBC:  Recent Labs Lab 11/03/16 0629 11/04/16 0311 11/05/16 0354 11/06/16 0329 11/08/16 0447  WBC 29.8* 25.7* 28.6* 21.4* 17.5*  HGB 12.8* 12.7* 12.8* 11.3* 10.2*  HCT 39.9 39.2 40.5 34.9* 31.8*  MCV 92.1 92.7 92.3 90.6 90.9  PLT 342 359 416* 413* 485*    CBG:  Recent Labs Lab 11/07/16 1623 11/07/16 2028 11/07/16 2351 11/08/16 0458 11/08/16 0749  GLUCAP 117* 91 119* 99 99     SIGNED: Time coordinating discharge:  30 minutes  MAGICK-Evonna Stoltz, MD  [facility] 11/08/2016, 10:10 AM Pager [phone]  If 7PM-7AM, please contact night-coverage [email or web address] Password [email or web address]

## 2016-11-08 NOTE — Progress Notes (Signed)
Patient c/o rt great toe swollen and hurts. Toe is warm and  With some pink to red color.

## 2016-11-08 NOTE — Progress Notes (Signed)
Modified Barium Swallow Progress Note  Patient Details  Name: Eric Hayes MRN: [id number] Date of Birth: 06/08/1976  Today's Date: 11/08/2016  Modified Barium Swallow completed.  Full report located under Chart Review in the Imaging Section.  Brief recommendations include the following:  Clinical Impression  Patient presents with a mild sensory motor acute reversible dysphagia with improvements since previous study. Continued sensory impairment and decreased laryngeal closure s/p 4 day intubation results in deep silent penetration of thin liquids. Use of chin tuck reduces penetration to mild and above the vocal cords with eventual clearance with additional cued throat clear. Pharyngeal strength intact. Discussed results with patient who is in agreement to advance diet with use of strict precautions. Education complete with mother as well who will relay information to patient's wife. Recommend repeat MBS in 2 weeks to determine potential to lift compensatory strategies. Have scheduled for 12/21 at 1100. Mother in agreement.    Swallow Evaluation Recommendations       SLP Diet Recommendations: Regular solids;Thin liquid   Liquid Administration via: Cup;No straw   Medication Administration: Whole meds with puree   Supervision: Patient able to self feed;Full assist for feeding   Compensations: Slow rate;Small sips/bites;Chin tuck;Clear throat after each swallow (with liquids only)   Postural Changes: Seated upright at 90 degrees   Oral Care Recommendations: Oral care BID     Gabriel Rainwater MA, CCC-SLP [phone]    [name] Meryl 11/08/2016,12:10 PM

## 2016-11-08 NOTE — Progress Notes (Signed)
Went over [place]. Instruct. With patient and wife and gave them copies of them and gave them his prescriptions. They both say they understand them. Patient belongings are pack and he is now waiting on I.V. Team to disconnect Picc so he can go home with it. Change dsg on rt flank. Inc c.d.i. w/ dermabond. Clean and left open to air. Patient has 3 c.t sites all are pink with c.t. Sutures scant drainage. Clean and new dsg appied.

## 2016-11-08 NOTE — Care Management Note (Signed)
Case Management Note Original Note initiated by Ellan Lambert 10/29/16  Patient Details  Name: Eric Hayes MRN: [id number] Date of Birth: December 19, 1975  Subjective/Objective:   Pt admitted on 10/27/16 with acute hypoxic respiratory failure and sepsis.  PTA, pt independent, lives with spouse.                   Action/Plan: Met with pt's wife at bedside; states pt independent prior to admission, but has been disabled for 6 years.   Will follow for discharge planning as pt progresses.  Wife able to provide care at dc.    Expected Discharge Date:                  Expected Discharge Plan:  [place]  In-House Referral:  Clinical Social Work  Discharge planning Services  CM Consult  Post Acute Care Choice:  Durable Medical Equipment, Home Health Choice offered to:  Patient, Spouse  DME Arranged:  [facility]rop[facility]ist DME Agency:  [facility] Arranged:  RN, PT [facility] Agency:  [place]  Status of Service:  Completed, signed off  If discussed at [place] of Stay Meetings, dates discussed:  12/7  Additional Comments:  11/08/16- 1100-Shanik Brookshire RN, CM- pt for d/c home today- with HHRN/PT- and IV abx- orders placed- spoke with pt at bedside- choice offered for [facility] agencies- per pt he would like for this CM to speak with his wifeVida Roller- cell # [phone]- call made to [facility] to discuss [facility] arrangements- per TC conversation with Lambert Keto- choice offered- Lambert Keto would like to use in order of preference [facility], [facility], or [facility]- per Lambert Keto pt's son who is a CNA will be at home while wife works. Call made to [facility] with [facility]- but they are unable to accept referral- call then made to Santiago Glad with [facility]- who is able to accept referral and can provide both [facility] services and IV abx needs through 12/17- have also spoken with Pam at [facility] regarding the IV abx needs- pt to get last dose for today prior to discharge this evening. Order has also been placed for home  nebulizer- spoke with Larene Beach from [facility]- nebulizer has been delivered to room. Pt already has RW at home.   11/02/16- Elenor Quinones, RN, BSN [phone] CM spoke with pt at bedside - pt is extubated but still confused and on HF [place].  PT recommended SNF - CSW consulted for placement dependent on progress.   Dahlia Client [place], RN 11/08/2016, 2:44 PM [phone]

## 2016-11-08 NOTE — Progress Notes (Signed)
Speech Language Pathology Treatment: Dysphagia  Patient Details Name: Eric Hayes MRN: [id number] DOB: May 21, 1976 Today's Date: 11/08/2016 Time: [id number] SLP Time Calculation (min) (ACUTE ONLY): 13 min  Assessment / Plan / Recommendation Clinical Impression  F/u diet tolerance assessment complete. Patient able to self feed nectar thick liquids with min verbal cueing for use of throat clear post swallow to clear potential penetrates noted on previous MBS. Patient without overt s/s of aspiration however penetration/aspiration noted to be silent on MBS 12/4. Discussed with patient and mother. Recommend repeat MBS to determine potential to advance diet. Discussed with MD who noted probable discharge this pm. Will plan for MBS today.    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 Reportedly had a fall several days prior to admit where he struck his chest and was wearing ace-wrap on his chest on presentation. In ER he had progressively worsening WOB, hypoxia, hypotension and progressive AMS. He required intubation shortly after arrival to ER and started on vasopressors. Further w/u revealed AKI with Scr 2.2, lactate 4, nonAG metabolic acidosis.CXR 11/28 showed diffuse right lung airspace disease and left base atelectasis. Intubated 11/26. Extubated 11/30      SLP Plan  MBS     Recommendations  Diet recommendations: Dysphagia 3 (mechanical soft);Nectar-thick liquid Liquids provided via: Cup Medication Administration: Crushed with puree Supervision: Patient able to self feed Compensations: Minimize environmental distractions;Slow rate;Small sips/bites;Follow solids with liquid Postural Changes and/or Swallow Maneuvers: Seated upright 90 degrees                Oral Care  Recommendations: Oral care BID Plan: MBS       GO               Vendela Troung MA, CCC-SLP [phone]  Eric Hayes 11/08/2016, 10:14 AM
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
## 2016-11-08 NOTE — Discharge Instructions (Signed)
Video Thoracoscopy, Care After Refer to this sheet in the next few weeks. These instructions provide you with information about caring for yourself after your procedure. Your health care provider may also give you more specific instructions. Your treatment has been planned according to current medical practices, but problems sometimes occur. Call your health care provider if you have any problems or questions after your procedure. What can I expect after the procedure? After your procedure, it is common to feel sore for up to two weeks. Follow these instructions at home:  There are many different ways to close and cover an incision, including stitches (sutures), skin glue, and adhesive strips. Follow your health care provider's instructions about:  Incision care.  Bandage (dressing) changes and removal.  Incision closure removal.  Check your incision area every day for signs of infection. Watch for:  Redness, swelling, or pain.  Fluid, blood, or pus.  Take medicines only as directed by your health care provider.  Try to cough often. Coughing helps to protect against lung infection (pneumonia). It may hurt to cough. If this happens, hold a pillow against your chest when you cough.  Take deep breaths. This also helps to protect against pneumonia.  If you were given an incentive spirometer, use it as directed by your health care provider.  Do not take baths, swim, or use a hot tub until your health care provider approves. You may take showers.  Avoid lifting until your health care provider approves.  Avoid driving until your health care provider approves.  Do not travel by airplane after the chest tube is removed until your health care provider approves. Contact a health care provider if:  You have a fever.  Pain medicines do not ease your pain.  You have redness, swelling, or increasing pain in your incision area.  You develop a cough that does not go away, or you are  coughing up mucus that is yellow or green. Get help right away if:  You have fluid, blood, or pus coming from your incision.  There is a bad smell coming from your incision or dressing.  You develop a rash.  You have difficulty breathing.  You cough up blood.  You develop light-headedness or you feel faint.  You develop chest pain.  Your heartbeat feels irregular or very fast. This information is not intended to replace advice given to you by your health care provider. Make sure you discuss any questions you have with your health care provider. Document Released: 06/08/2005 Document Revised: 07/22/2016 Document Reviewed: 08/04/2014 Elsevier Interactive Patient Education  2017 [employer].

## 2016-11-09 ENCOUNTER — Telehealth: Payer: Self-pay | Admitting: Medical

## 2016-11-09 ENCOUNTER — Other Ambulatory Visit (HOSPITAL_COMMUNITY): Payer: Self-pay | Admitting: Internal Medicine

## 2016-11-09 DIAGNOSIS — G8929 Other chronic pain: Secondary | ICD-10-CM | POA: Diagnosis not present

## 2016-11-09 DIAGNOSIS — Z452 Encounter for adjustment and management of vascular access device: Secondary | ICD-10-CM | POA: Diagnosis not present

## 2016-11-09 DIAGNOSIS — J181 Lobar pneumonia, unspecified organism: Secondary | ICD-10-CM | POA: Diagnosis not present

## 2016-11-09 DIAGNOSIS — E119 Type 2 diabetes mellitus without complications: Secondary | ICD-10-CM | POA: Diagnosis not present

## 2016-11-09 DIAGNOSIS — Z792 Long term (current) use of antibiotics: Secondary | ICD-10-CM | POA: Diagnosis not present

## 2016-11-09 DIAGNOSIS — I10 Essential (primary) hypertension: Secondary | ICD-10-CM | POA: Diagnosis not present

## 2016-11-09 DIAGNOSIS — I214 Non-ST elevation (NSTEMI) myocardial infarction: Secondary | ICD-10-CM | POA: Diagnosis not present

## 2016-11-09 DIAGNOSIS — G4733 Obstructive sleep apnea (adult) (pediatric): Secondary | ICD-10-CM | POA: Diagnosis not present

## 2016-11-09 DIAGNOSIS — Z8614 Personal history of Methicillin resistant Staphylococcus aureus infection: Secondary | ICD-10-CM | POA: Diagnosis not present

## 2016-11-09 DIAGNOSIS — R1319 Other dysphagia: Secondary | ICD-10-CM

## 2016-11-09 DIAGNOSIS — M1990 Unspecified osteoarthritis, unspecified site: Secondary | ICD-10-CM | POA: Diagnosis not present

## 2016-11-09 DIAGNOSIS — J869 Pyothorax without fistula: Secondary | ICD-10-CM | POA: Diagnosis not present

## 2016-11-09 DIAGNOSIS — F319 Bipolar disorder, unspecified: Secondary | ICD-10-CM | POA: Diagnosis not present

## 2016-11-09 DIAGNOSIS — Z86718 Personal history of other venous thrombosis and embolism: Secondary | ICD-10-CM | POA: Diagnosis not present

## 2016-11-09 DIAGNOSIS — Z5181 Encounter for therapeutic drug level monitoring: Secondary | ICD-10-CM | POA: Diagnosis not present

## 2016-11-09 DIAGNOSIS — Z720 Tobacco use: Secondary | ICD-10-CM | POA: Diagnosis not present

## 2016-11-09 DIAGNOSIS — Z48813 Encounter for surgical aftercare following surgery on the respiratory system: Secondary | ICD-10-CM | POA: Diagnosis not present

## 2016-11-09 NOTE — Telephone Encounter (Signed)
[facility] called back & covered by ins total $31 for both, they are out of the Xopenex & would be Monday before they could get it in.  I asked her to call around & see if another [facility] had it

## 2016-11-09 NOTE — Telephone Encounter (Signed)
Called and spoke to pt's wife Claiborne Billings concerning pt's recent hospitalization. She states that pt is doing ok. We discussed meds and she stated that two of the meds the hospital put him on required a prior authorizations. She stated that the rest of his meds were filled and at home and she had a good understanding of how to take them.  Hospital summary stated that pt needed to be seen in one to two weeks but pt's wife felt like he needed to be seen sooner. Appointment was made for Monday and she was told to bring all meds with them for that appt and she verbalized understanding. After hours protocol was discussed and she stated she understood. Pt's wife was then transferred to Mickel Baas to discuss prior authorizations for his medications.

## 2016-11-09 NOTE — Telephone Encounter (Signed)
Wife called pt was unable to get Budesonide or Levabuterol (Xopenex) due to needing P.A.  Called [facility]   T# [id number] s/w Verdis Frederickson & she got diagnosis codes and is working on these and will call back if unable to get approved.

## 2016-11-10 LAB — CULTURE, BLOOD (ROUTINE X 2): CULTURE: NO GROWTH

## 2016-11-12 ENCOUNTER — Other Ambulatory Visit: Payer: Self-pay | Admitting: Medical

## 2016-11-12 ENCOUNTER — Telehealth: Payer: Self-pay | Admitting: Medical

## 2016-11-12 ENCOUNTER — Ambulatory Visit (INDEPENDENT_AMBULATORY_CARE_PROVIDER_SITE_OTHER): Payer: Medicare Other | Admitting: Medical

## 2016-11-12 ENCOUNTER — Encounter: Payer: Self-pay | Admitting: Medical

## 2016-11-12 ENCOUNTER — Telehealth: Payer: Self-pay

## 2016-11-12 VITALS — BP 108/64 | HR 105 | Temp 98.4°F | Wt 260.2 lb

## 2016-11-12 DIAGNOSIS — I214 Non-ST elevation (NSTEMI) myocardial infarction: Secondary | ICD-10-CM | POA: Diagnosis not present

## 2016-11-12 DIAGNOSIS — J869 Pyothorax without fistula: Secondary | ICD-10-CM

## 2016-11-12 DIAGNOSIS — F17200 Nicotine dependence, unspecified, uncomplicated: Secondary | ICD-10-CM | POA: Diagnosis not present

## 2016-11-12 DIAGNOSIS — N179 Acute kidney failure, unspecified: Secondary | ICD-10-CM

## 2016-11-12 DIAGNOSIS — R6521 Severe sepsis with septic shock: Secondary | ICD-10-CM | POA: Diagnosis not present

## 2016-11-12 DIAGNOSIS — E119 Type 2 diabetes mellitus without complications: Secondary | ICD-10-CM | POA: Diagnosis not present

## 2016-11-12 DIAGNOSIS — J9601 Acute respiratory failure with hypoxia: Secondary | ICD-10-CM

## 2016-11-12 DIAGNOSIS — G4733 Obstructive sleep apnea (adult) (pediatric): Secondary | ICD-10-CM | POA: Diagnosis not present

## 2016-11-12 DIAGNOSIS — J181 Lobar pneumonia, unspecified organism: Secondary | ICD-10-CM | POA: Diagnosis not present

## 2016-11-12 DIAGNOSIS — J189 Pneumonia, unspecified organism: Secondary | ICD-10-CM | POA: Diagnosis not present

## 2016-11-12 DIAGNOSIS — E118 Type 2 diabetes mellitus with unspecified complications: Secondary | ICD-10-CM | POA: Diagnosis not present

## 2016-11-12 DIAGNOSIS — R778 Other specified abnormalities of plasma proteins: Secondary | ICD-10-CM

## 2016-11-12 DIAGNOSIS — R748 Abnormal levels of other serum enzymes: Secondary | ICD-10-CM

## 2016-11-12 DIAGNOSIS — Z48813 Encounter for surgical aftercare following surgery on the respiratory system: Secondary | ICD-10-CM | POA: Diagnosis not present

## 2016-11-12 DIAGNOSIS — A419 Sepsis, unspecified organism: Secondary | ICD-10-CM

## 2016-11-12 DIAGNOSIS — R7989 Other specified abnormal findings of blood chemistry: Secondary | ICD-10-CM

## 2016-11-12 DIAGNOSIS — Z452 Encounter for adjustment and management of vascular access device: Secondary | ICD-10-CM | POA: Diagnosis not present

## 2016-11-12 LAB — GLUCOSE, POCT (MANUAL RESULT ENTRY): POC Glucose: 107 mg/dl — AB (ref 70–99)

## 2016-11-12 MED ORDER — BUDESONIDE 0.5 MG/2ML IN SUSP: 0.5000 mg | Freq: Two times a day (BID) | RESPIRATORY_TRACT | 0 refills | Status: DC

## 2016-11-12 MED ORDER — LEVALBUTEROL HCL 0.63 MG/3ML IN NEBU: 0.6300 mg | INHALATION_SOLUTION | Freq: Four times a day (QID) | RESPIRATORY_TRACT | 1 refills | Status: DC | PRN

## 2016-11-12 NOTE — Telephone Encounter (Signed)
See if we can get overnight oximetry set up through [facility] then

## 2016-11-12 NOTE — Telephone Encounter (Signed)
I have spoke with Lelan Pons that is his home health nurse she is going to fax [place] the results as soon as see get them, also the pharmacy has the order. [facility] doesn't  Offer over  Night  pluse ox.  But [facility] does in [place] , they live in [place]

## 2016-11-12 NOTE — Telephone Encounter (Signed)
1- call back [facility] and ask Verdis Frederickson to give me any pulse ox readings they have.  If they don't have any, I want to order overnight oximetry.  He may need to be on oxygen short term  2- ask them to fax lab results ASAP  3- call [facility] pharmacy and verify they have order for levalbuterol and Pulmicort liquid for nebulizer

## 2016-11-12 NOTE — Telephone Encounter (Signed)
Marie with [facility] called to let us know that pt did not get the Pulmicort and Xopenex on Friday due to the weather. Mickel Baas had worked for over an hour to get this set up for pt Friday afternoon d/t insurance issues.  Lelan Pons reports pt lung sound terrible. O2 sats up to 93%. Please call Lelan Pons with recommendations [phone], Lelan Pons aware of appt today.

## 2016-11-12 NOTE — Progress Notes (Signed)
Subjective Chief Complaint  Patient presents with  . hospital follow up    hospital follow x 13 day pneumonia   Here for hospital f/u. accompanied by wife.     He was hospitalized 10/27/16 - 11/08/16 for septic shock, acute kidney injury, CAP, hypoxemic respiratory failure, elevated troponin, preop cardiovascular exam.  He has hx/o bipolar disorder, chronic pain, diabetes, hx/o DVT, hypertension, OSA but noncompliant with CPAP, medication noncompliance, and he presented to the ED with fever, diarrhea, progressive dyspnea.  Was intubated due to worsening dyspnea, hypoxia, hypotension and progressive altered mental status.    He ultimately had VATs 11/29, subsequent chest tube placed.  Had PICC line placed  Was discharged on Vancomycin which he is compliant with, through [facility].     Prior to discharge, passed swallow study.  His leukocytosis was trending down prior to discharge.  Lasix was discontinued prior to discharge.  Today he notes feeling "fine" however  Wife says he is weak, lying about how he feels, is still having some dyspnea, hasn't been eating much.  No other aggravating or relieving factors. No other complaint.   Past Medical History:  Diagnosis Date  . Anxiety   . Back pain, chronic    mid- and lower back  . Bipolar disorder ([place])   . Depression   . Diet-controlled diabetes mellitus ([place])   . History of DVT (deep vein thrombosis) 07/2010   right leg  . History of MRSA infection 2013   thigh  . Hyperlipidemia    no current med.  . Hypertension    no current med.  . Osteoarthritis   . Sleep apnea    uses CPAP nightly   Current Outpatient Prescriptions on File Prior to Visit  Medication Sig Dispense Refill  . allopurinol (ZYLOPRIM) 100 MG tablet TAKE 1 TABLET(100 MG) BY MOUTH DAILY 90 tablet 0  . alprazolam (XANAX) 2 MG tablet Take 1 tablet (2 mg total) by mouth 3 (three) times daily as needed for anxiety. 20 tablet 0  . amphetamine-dextroamphetamine  (ADDERALL) 20 MG tablet Take 30 mg by mouth 2 (two) times daily.     . colchicine 0.6 MG tablet Take 1 tablet (0.6 mg total) by mouth daily as needed. 20 tablet 0  . diclofenac (VOLTAREN) 75 MG EC tablet Take 1 tablet (75 mg total) by mouth 2 (two) times daily. 30 tablet 0  . famotidine (PEPCID) 40 MG/5ML suspension Take 2.5 mLs (20 mg total) by mouth 2 (two) times daily. 50 mL 2  . senna-docusate (SENOKOT-S) 8.6-50 MG tablet Take 1 tablet by mouth 2 (two) times daily. 40 tablet 0  . vancomycin 1,750 mg in sodium chloride 0.9 % 500 mL Inject 1,750 mg into the vein every 12 (twelve) hours. Continue taking until 11/18/2016, which will complete total 21 days of antibiotic therapy. 22 application 0  . Azelastine-Fluticasone (DYMISTA) 137-50 MCG/ACT SUSP Place 2 sprays into the nose 2 (two) times daily. 23 g 2  . oxyCODONE (OXY IR/ROXICODONE) 5 MG immediate release tablet Take 1-2 tablets (5-10 mg total) by mouth every 4 (four) hours as needed for moderate pain or severe pain. (Patient not taking: Reported on 11/12/2016) 30 tablet 0   No current facility-administered medications on file prior to visit.    ROS as in subjective    Objective BP 108/64   Pulse (!) 105   Temp 98.4 F (36.9 C)   Wt 260 lb 3.2 oz (118 kg)   SpO2 95%   BMI  32.52 kg/m   Wt Readings from Last 3 Encounters:  11/12/16 260 lb 3.2 oz (118 kg)  11/08/16 260 lb 8 oz (118.2 kg)  09/22/16 270 lb (122.5 kg)   Gen: pale, somewhat ill appearing, clammy, fatigue /weak appearing Cap refill normal Heart rrr, normal s1, s2, no murmurs Ext: no edema Pulses WNL Lungs: decreased right lung sounds throughout, +crackles mid fields no wheezing Oral: MMM hent unremarkable    Assessment: Encounter Diagnoses  Name Primary?  . Septic shock ([place]) Yes  . Empyema ([place])   . Diabetes mellitus with complication ([place])   . Diabetes mellitus without complication ([place])   . Acute hypoxemic respiratory failure ([place])   . Community  acquired pneumonia of right lower lobe of lung ([place])   . OSA (obstructive sleep apnea)   . Elevated troponin   . Tobacco use disorder   . AKI (acute kidney injury) [facility])      Plan: Reviewed labs, d/c summary from 11/08/16, imaging, notes.     We touched base with home health today, and labs were drawn this morning for BMET, CBC.  We should have results by tomorrow morning.    We will call to get overnight oximetry done   Advised he get the budesonide and albuterol ASAP today to continue this since he has been out of this the last several days  Advised rest, hydration, finish out the Vancomycin via PICC line.  Discussed getting adequate and appropriate nutrients.   Gradual return to activity as tolerated.    Advise he f/u as planned with cardiothoracic surgery.  Discussed anemia noted on recent labs.  F/u pending labs.    Of note, he is not compliant with CPAP.      Eric Hayes was seen today for hospital follow up.  Diagnoses and all orders for this visit:  Septic shock ([place])  Empyema ([place])  Diabetes mellitus with complication ([place])  Diabetes mellitus without complication ([place]) Comments: error Orders: -     Glucose (CBG)  Acute hypoxemic respiratory failure ([place])  Community acquired pneumonia of right lower lobe of lung ([facility])  OSA (obstructive sleep apnea)  Elevated troponin  Tobacco use disorder  AKI (acute kidney injury) ([place])  Other orders -     Discontinue: levalbuterol (XOPENEX) 0.63 MG/3ML nebulizer solution; Take 3 mLs (0.63 mg total) by nebulization every 6 (six) hours as needed for wheezing or shortness of breath. -     budesonide (PULMICORT) 0.5 MG/2ML nebulizer solution; Take 2 mLs (0.5 mg total) by nebulization 2 (two) times daily.

## 2016-11-12 NOTE — Telephone Encounter (Signed)
Thanks Eric Hayes for working on this.   Touch base with [name] to make sure patient got the 2 nebulized medications on Monday afternoon!  I had sent separate message to [facility].

## 2016-11-13 ENCOUNTER — Telehealth: Payer: Self-pay | Admitting: Medical

## 2016-11-13 NOTE — Telephone Encounter (Signed)
ok 

## 2016-11-13 NOTE — Telephone Encounter (Signed)
Pt's spouse called requesting refill oxycodone 5 mg. Pt is in quite a bit of pain right now and need more pain meds.  Also spouse call to inquire about lab results

## 2016-11-13 NOTE — Telephone Encounter (Signed)
Wife was informed on the 8th that she would need to go to 2 different [facility] to pick up the 2 different nebulizers and the total of both were $31, and we located Xopenex at [facility] on [address]

## 2016-11-13 NOTE — Telephone Encounter (Signed)
Eric Hayes or Eric Hayes (whoever gets to this first):  I would recommend he wait on injections in the short term/over the next few week until he is much improved.    In the meantime pain management can c/t medication management for pain.  Please call his pain clinic to advise they hold off for a few weeks on injections given his recent illness, but please ask them to c/t his pain medication in the meantime.  He just had a major hospitalization for pneumonia and subsequent surgery to remove abscess from lungs.

## 2016-11-13 NOTE — Telephone Encounter (Signed)
Tried called both numbers and unable to reach.

## 2016-11-13 NOTE — Telephone Encounter (Signed)
Pt's spouse called stating that Dr Andree Elk at pain mgmnt office called wanting to schedule pt for neck/back injections. Wife did not schedule appt & told them that she would have to check with Audelia Acton to see if pt can get injections given everything he has gone through.  Also wife picked up the one nebulizer solution that [facility] had yesterday and they were to call her today when the others can in but they have not heard from [facility] yet. Is it ok to continue to wait?

## 2016-11-13 NOTE — Telephone Encounter (Signed)
Lelan Pons called back from [facility], she check in to doing the over night pluses ox,  She said that she sorry that she told you wrong.  They do over night pluses ox.  I will fax order to [facility].

## 2016-11-13 NOTE — Telephone Encounter (Signed)
Tried calling both numbers unable to reach

## 2016-11-14 ENCOUNTER — Other Ambulatory Visit: Payer: Self-pay | Admitting: Medical

## 2016-11-15 ENCOUNTER — Encounter (HOSPITAL_COMMUNITY): Payer: Self-pay

## 2016-11-15 ENCOUNTER — Encounter: Payer: Self-pay | Admitting: Infectious Diseases

## 2016-11-15 ENCOUNTER — Emergency Department (HOSPITAL_COMMUNITY)
Admission: EM | Admit: 2016-11-15 | Discharge: 2016-11-15 | Disposition: A | Discharge status: Home / Self Care | Payer: Medicare Other | Attending: Dermatology | Admitting: Dermatology

## 2016-11-15 DIAGNOSIS — Z5321 Procedure and treatment not carried out due to patient leaving prior to being seen by health care provider: Secondary | ICD-10-CM | POA: Insufficient documentation

## 2016-11-15 DIAGNOSIS — E119 Type 2 diabetes mellitus without complications: Secondary | ICD-10-CM | POA: Diagnosis not present

## 2016-11-15 DIAGNOSIS — J869 Pyothorax without fistula: Secondary | ICD-10-CM | POA: Diagnosis not present

## 2016-11-15 DIAGNOSIS — Z452 Encounter for adjustment and management of vascular access device: Secondary | ICD-10-CM | POA: Diagnosis not present

## 2016-11-15 DIAGNOSIS — J189 Pneumonia, unspecified organism: Secondary | ICD-10-CM | POA: Diagnosis not present

## 2016-11-15 DIAGNOSIS — I214 Non-ST elevation (NSTEMI) myocardial infarction: Secondary | ICD-10-CM | POA: Diagnosis not present

## 2016-11-15 DIAGNOSIS — J181 Lobar pneumonia, unspecified organism: Secondary | ICD-10-CM | POA: Diagnosis not present

## 2016-11-15 DIAGNOSIS — F17210 Nicotine dependence, cigarettes, uncomplicated: Secondary | ICD-10-CM | POA: Diagnosis not present

## 2016-11-15 DIAGNOSIS — I10 Essential (primary) hypertension: Secondary | ICD-10-CM | POA: Insufficient documentation

## 2016-11-15 DIAGNOSIS — Z48813 Encounter for surgical aftercare following surgery on the respiratory system: Secondary | ICD-10-CM | POA: Diagnosis not present

## 2016-11-15 NOTE — Telephone Encounter (Signed)
Called pt spoke with his wife  Gave her this information.

## 2016-11-15 NOTE — ED Triage Notes (Addendum)
Pt sent here by his home health nurse to have his PICC line evaluated. Pt has no complaints. Picc line dressing is clean, no redness or swelling noted. Pt states it is flushing well. Pt was recently admitted in ICU for fall and PNA.

## 2016-11-15 NOTE — ED Triage Notes (Signed)
Pt's mother states she called her [facility] and she will see pt in the morning and make MD appointment. Pt not running fever, not in pain and would like to go home. RN advised against leaving and asked if there was anything she could do to help pt stay. Pt still wishes to leave.

## 2016-11-16 ENCOUNTER — Telehealth: Payer: Self-pay | Admitting: Medical

## 2016-11-16 ENCOUNTER — Other Ambulatory Visit: Payer: Self-pay | Admitting: Thoracic Surgery (Cardiothoracic Vascular Surgery)

## 2016-11-16 ENCOUNTER — Other Ambulatory Visit: Payer: Self-pay

## 2016-11-16 DIAGNOSIS — I214 Non-ST elevation (NSTEMI) myocardial infarction: Secondary | ICD-10-CM | POA: Diagnosis not present

## 2016-11-16 DIAGNOSIS — Z48813 Encounter for surgical aftercare following surgery on the respiratory system: Secondary | ICD-10-CM | POA: Diagnosis not present

## 2016-11-16 DIAGNOSIS — E119 Type 2 diabetes mellitus without complications: Secondary | ICD-10-CM | POA: Diagnosis not present

## 2016-11-16 DIAGNOSIS — J869 Pyothorax without fistula: Secondary | ICD-10-CM | POA: Diagnosis not present

## 2016-11-16 DIAGNOSIS — Z452 Encounter for adjustment and management of vascular access device: Secondary | ICD-10-CM | POA: Diagnosis not present

## 2016-11-16 DIAGNOSIS — J181 Lobar pneumonia, unspecified organism: Secondary | ICD-10-CM | POA: Diagnosis not present

## 2016-11-16 DIAGNOSIS — R7981 Abnormal blood-gas level: Secondary | ICD-10-CM

## 2016-11-16 NOTE — Telephone Encounter (Signed)
The labs we got back were somewhat improved from prior lab in the hospital.   I haven't gotten back a pulse oximetry overnight!!!  This should have come within 24 hours.  I need them this morning.  See how he is feeling today?

## 2016-11-19 ENCOUNTER — Encounter: Payer: Self-pay | Admitting: Infectious Diseases

## 2016-11-19 ENCOUNTER — Ambulatory Visit
Admission: RE | Admit: 2016-11-19 | Discharge: 2016-11-19 | Disposition: A | Discharge status: Home / Self Care | Payer: Medicare Other | Source: Ambulatory Visit | Attending: Thoracic Surgery (Cardiothoracic Vascular Surgery) | Admitting: Thoracic Surgery (Cardiothoracic Vascular Surgery)

## 2016-11-19 ENCOUNTER — Ambulatory Visit (INDEPENDENT_AMBULATORY_CARE_PROVIDER_SITE_OTHER): Payer: Self-pay | Admitting: Surgical

## 2016-11-19 VITALS — BP 120/78 | HR 115 | Temp 98.2°F | Resp 20 | Ht 75.0 in | Wt 260.0 lb

## 2016-11-19 DIAGNOSIS — R6521 Severe sepsis with septic shock: Secondary | ICD-10-CM | POA: Diagnosis not present

## 2016-11-19 DIAGNOSIS — E119 Type 2 diabetes mellitus without complications: Secondary | ICD-10-CM | POA: Diagnosis not present

## 2016-11-19 DIAGNOSIS — J869 Pyothorax without fistula: Secondary | ICD-10-CM

## 2016-11-19 DIAGNOSIS — Z09 Encounter for follow-up examination after completed treatment for conditions other than malignant neoplasm: Secondary | ICD-10-CM

## 2016-11-19 DIAGNOSIS — Z452 Encounter for adjustment and management of vascular access device: Secondary | ICD-10-CM | POA: Diagnosis not present

## 2016-11-19 DIAGNOSIS — Z48813 Encounter for surgical aftercare following surgery on the respiratory system: Secondary | ICD-10-CM | POA: Diagnosis not present

## 2016-11-19 DIAGNOSIS — J189 Pneumonia, unspecified organism: Secondary | ICD-10-CM | POA: Diagnosis not present

## 2016-11-19 DIAGNOSIS — J90 Pleural effusion, not elsewhere classified: Secondary | ICD-10-CM | POA: Diagnosis not present

## 2016-11-19 DIAGNOSIS — J181 Lobar pneumonia, unspecified organism: Secondary | ICD-10-CM | POA: Diagnosis not present

## 2016-11-19 DIAGNOSIS — I214 Non-ST elevation (NSTEMI) myocardial infarction: Secondary | ICD-10-CM | POA: Diagnosis not present

## 2016-11-19 NOTE — Patient Instructions (Signed)
Continue current medications and aggressive pulmonary toilet with coughing and deep breathing with use of the incentive spirometer.

## 2016-11-19 NOTE — Progress Notes (Signed)
[address]       [place] [address]             [phone]                  Aveer N Berrie Hays Medical Record [id number] Date of Birth: 22-Jul-1976  Referring [id number], Camelia Eng, PA-C Primary Cardiology: Primary Care:TYSINGER, Redge Gainer, PA-C  Chief Complaint:  Follow Up Visit  DATE OF PROCEDURE:  10/31/2016 DATE OF DISCHARGE:                              OPERATIVE REPORT   PREOPERATIVE DIAGNOSIS:  Right parapneumonic effusion.  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  PROCEDURE:   Right video-assisted thoracoscopy, Drainage of empyema, Visceral and parietal pleural decortication.  SURGEON:  Revonda Standard. Roxan Hockey, M.D.  ASSISTANT:  Ellwood Handler, PA.  ANESTHESIA:  General.  FINDINGS:  1.75 L of serous fluid evacuated.  Extensive early fibrinous peel on the visceral and parietal pleura.  Lung abscess in right lower lobe.   History of Present Illness:    The patient is a 40 year old male status post the above described procedure. He is seen in the office on today's date in routine follow-up. Overall he feels that, in general, he is improving. He does have some mildly productive cough. He is only having mild pain. He is somewhat short of breath with exertion but no significant shortness of breath at rest. His energy and activity level is slowly improving. He has had no significant difficulties with his incisions. Today is the scheduled last dose of vancomycin and the plan is for his home nurse to remove PICC line in a.m.     Zubrod Score: At the time of surgery this patient's most appropriate activity status/level should be described as: []     0    Normal activity, no symptoms []     1    Restricted in physical strenuous activity but ambulatory, able to do out light work []     2    Ambulatory and capable of self care, unable to do work activities, up and about                 >50 % of waking hours                                                                                    []     3    Only limited self care, in bed greater than 50% of waking hours []     4    Completely disabled, no self care, confined to bed or chair []     5    Moribund  History  Smoking Status  . Current Every Day Smoker  . Packs/day: 1.00  . Years: 15.00  . Types: Cigarettes  Smokeless Tobacco  . Never Used       Allergies  Allergen Reactions  . Penicillins Hives and Swelling    AS A CHILD, has tolerated ceftriaxone and cephalexin in past.     Current Outpatient Prescriptions  Medication Sig Dispense Refill  . allopurinol (ZYLOPRIM) 100 MG tablet TAKE 1 TABLET(100 MG) BY MOUTH DAILY 90 tablet 0  . alprazolam (XANAX) 2 MG tablet Take 1 tablet (2 mg total) by mouth 3 (three) times daily as needed for anxiety. 20 tablet 0  . amphetamine-dextroamphetamine (ADDERALL) 20 MG tablet Take 30 mg by mouth 2 (two) times daily.     . Azelastine-Fluticasone (DYMISTA) 137-50 MCG/ACT SUSP Place 2 sprays into the nose 2 (two) times daily. 23 g 2  . budesonide (PULMICORT) 0.5 MG/2ML nebulizer solution USE 2 ML(0.5 MG) VIA NEBULIZER TWICE DAILY 360 mL 0  . colchicine 0.6 MG tablet Take 1 tablet (0.6 mg total) by mouth daily as needed. 20 tablet 0  . diclofenac (VOLTAREN) 75 MG EC tablet Take 1 tablet (75 mg total) by mouth 2 (two) times daily. 30 tablet 0  . famotidine (PEPCID) 40 MG/5ML suspension Take 2.5 mLs (20 mg total) by mouth 2 (two) times daily. 50 mL 2  . levalbuterol (XOPENEX) 0.63 MG/3ML nebulizer solution USE 1 VIAL(3 ML) IN NEBULIZER EVERY 6 HOURS AS NEEDED FOR WHEEZING OR SHORTNESS OF BREATH 1125 mL 1  . oxyCODONE (OXY IR/ROXICODONE) 5 MG immediate release tablet Take 1-2 tablets (5-10 mg total) by mouth every 4 (four) hours as needed for moderate pain or severe pain. (Patient not taking: Reported on 11/12/2016) 30 tablet 0  . senna-docusate (SENOKOT-S) 8.6-50 MG tablet Take 1 tablet by mouth 2 (two) times  daily. 40 tablet 0  . vancomycin 1,750 mg in sodium chloride 0.9 % 500 mL Inject 1,750 mg into the vein every 12 (twelve) hours. Continue taking until 11/18/2016, which will complete total 21 days of antibiotic therapy. 22 application 0   No current facility-administered medications for this visit.        Physical Exam: BP 120/78   Pulse (!) 115   Temp 98.2 F (36.8 C) (Oral)   Resp 20   Ht 6\' 3"  (1.905 m)   Wt 260 lb (117.9 kg)   SpO2 96% Comment: RA  BMI 32.50 kg/m   General appearance: alert, cooperative and no distress Heart: regular rate and rhythm Lungs: Diminished in the right base. Abdomen: Benign exam Wound: well healed   Diagnostic Studies & Laboratory data:         Recent Radiology Findings: Dg Chest 2 View  Result Date: 11/19/2016 CLINICAL DATA:  40 y/o M; chest tube removal post pneumonia evaluate for resolution. EXAM: CHEST  2 VIEW COMPARISON:  11/07/2016 chest radiograph FINDINGS: Moderate right pleural effusion and ill-defined right mid and lower lung zone opacities are mildly improved in comparison prior radiographs. Right PICC line tip projects over lower SVC. Clear left lung. Stable cardiac silhouette. Bones are unremarkable. IMPRESSION: Moderate right pleural effusion and ill-defined right mid and lower lung zone opacities are mildly improved from prior radiographs. No new focal consolidation. Electronically Signed   By: Kristine Garbe M.D.   On: 11/19/2016 14:02      I have independently reviewed the above radiology findings and reviewed findings  with the patient.  Recent Labs: Lab Results  Component Value Date   WBC 17.5 (H) 11/08/2016   HGB 10.2 (L) 11/08/2016   HCT 31.8 (L) 11/08/2016   PLT 485 (H) 11/08/2016   GLUCOSE 92 11/08/2016   CHOL 155 07/18/2016   TRIG 94 07/18/2016   HDL 38 (L) 07/18/2016   LDLCALC 98 07/18/2016   ALT 20 11/05/2016   AST 28 11/05/2016   NA  135 11/08/2016   K 3.4 (L) 11/08/2016   CL 105 11/08/2016    CREATININE 0.90 11/08/2016   BUN 8 11/08/2016   CO2 22 11/08/2016   TSH 0.793 01/12/2013   INR 4.05 (H) 07/26/2010   HGBA1C 5.5 07/18/2016      Assessment / Plan:  Overall the patient does continue to improve. His chest x-ray is also showing improvement. He still has some evidence of infiltrates and atelectasis. He has some productive sputum and will continue his breathing treatments. We will see him in the office in 1 month with a repeat chest x-ray to evaluate ongoing recovery. We will see him prior to that if he has any acute difficulties.         Jamee Keach E 11/19/2016 2:29 PM

## 2016-11-19 NOTE — Telephone Encounter (Signed)
Called and spoke jamie r.t, with [facility]  She she said that she has called l/m on Friday to the call and to come by the store  To pick up unit, she said that was calling them again today .

## 2016-11-19 NOTE — Telephone Encounter (Signed)
[place] - he was seen in f/u by cardiothoracic surgery today and apparently was improving.

## 2016-11-20 ENCOUNTER — Encounter: Payer: Self-pay | Admitting: Medical

## 2016-11-20 ENCOUNTER — Other Ambulatory Visit: Payer: Self-pay | Admitting: Medical

## 2016-11-20 DIAGNOSIS — I214 Non-ST elevation (NSTEMI) myocardial infarction: Secondary | ICD-10-CM | POA: Diagnosis not present

## 2016-11-20 DIAGNOSIS — J181 Lobar pneumonia, unspecified organism: Secondary | ICD-10-CM | POA: Diagnosis not present

## 2016-11-20 DIAGNOSIS — J869 Pyothorax without fistula: Secondary | ICD-10-CM | POA: Diagnosis not present

## 2016-11-20 DIAGNOSIS — Z452 Encounter for adjustment and management of vascular access device: Secondary | ICD-10-CM | POA: Diagnosis not present

## 2016-11-20 DIAGNOSIS — Z48813 Encounter for surgical aftercare following surgery on the respiratory system: Secondary | ICD-10-CM | POA: Diagnosis not present

## 2016-11-20 DIAGNOSIS — E119 Type 2 diabetes mellitus without complications: Secondary | ICD-10-CM | POA: Diagnosis not present

## 2016-11-20 NOTE — Telephone Encounter (Signed)
Is this okay to refill? 

## 2016-11-20 NOTE — Telephone Encounter (Signed)
[facility] called  To a order to follow up with mr.Molyneux for once a week for home health care. Also they pull the the pic line yesterday . And she said that his seem to be doing better , color slow coming back. o2 was at 96 % today

## 2016-11-20 NOTE — Telephone Encounter (Signed)
She just did a verbal today and  Will fax orders to you

## 2016-11-20 NOTE — Telephone Encounter (Signed)
Do you need me to sign an order about home health?

## 2016-11-22 ENCOUNTER — Ambulatory Visit (HOSPITAL_COMMUNITY): Admit: 2016-11-22 | Payer: Medicare Other

## 2016-11-22 ENCOUNTER — Inpatient Hospital Stay (HOSPITAL_COMMUNITY): Admission: RE | Admit: 2016-11-22 | Payer: Medicare Other | Source: Ambulatory Visit

## 2016-11-22 DIAGNOSIS — R0602 Shortness of breath: Secondary | ICD-10-CM | POA: Diagnosis not present

## 2016-11-28 DIAGNOSIS — Z452 Encounter for adjustment and management of vascular access device: Secondary | ICD-10-CM | POA: Diagnosis not present

## 2016-11-28 DIAGNOSIS — J181 Lobar pneumonia, unspecified organism: Secondary | ICD-10-CM | POA: Diagnosis not present

## 2016-11-28 DIAGNOSIS — J869 Pyothorax without fistula: Secondary | ICD-10-CM | POA: Diagnosis not present

## 2016-11-28 DIAGNOSIS — E119 Type 2 diabetes mellitus without complications: Secondary | ICD-10-CM | POA: Diagnosis not present

## 2016-11-28 DIAGNOSIS — Z48813 Encounter for surgical aftercare following surgery on the respiratory system: Secondary | ICD-10-CM | POA: Diagnosis not present

## 2016-11-28 DIAGNOSIS — I214 Non-ST elevation (NSTEMI) myocardial infarction: Secondary | ICD-10-CM | POA: Diagnosis not present

## 2016-11-29 ENCOUNTER — Ambulatory Visit (HOSPITAL_COMMUNITY)
Admission: RE | Admit: 2016-11-29 | Discharge: 2016-11-29 | Disposition: A | Discharge status: Home / Self Care | Payer: Medicare Other | Source: Ambulatory Visit | Attending: Internal Medicine | Admitting: Internal Medicine

## 2016-11-29 DIAGNOSIS — Z8614 Personal history of Methicillin resistant Staphylococcus aureus infection: Secondary | ICD-10-CM | POA: Diagnosis not present

## 2016-11-29 DIAGNOSIS — I10 Essential (primary) hypertension: Secondary | ICD-10-CM | POA: Insufficient documentation

## 2016-11-29 DIAGNOSIS — F419 Anxiety disorder, unspecified: Secondary | ICD-10-CM | POA: Insufficient documentation

## 2016-11-29 DIAGNOSIS — M1990 Unspecified osteoarthritis, unspecified site: Secondary | ICD-10-CM | POA: Insufficient documentation

## 2016-11-29 DIAGNOSIS — E785 Hyperlipidemia, unspecified: Secondary | ICD-10-CM | POA: Diagnosis not present

## 2016-11-29 DIAGNOSIS — R05 Cough: Secondary | ICD-10-CM | POA: Diagnosis not present

## 2016-11-29 DIAGNOSIS — G4730 Sleep apnea, unspecified: Secondary | ICD-10-CM | POA: Insufficient documentation

## 2016-11-29 DIAGNOSIS — F319 Bipolar disorder, unspecified: Secondary | ICD-10-CM | POA: Insufficient documentation

## 2016-11-29 DIAGNOSIS — Z86718 Personal history of other venous thrombosis and embolism: Secondary | ICD-10-CM | POA: Insufficient documentation

## 2016-11-29 DIAGNOSIS — G8929 Other chronic pain: Secondary | ICD-10-CM | POA: Insufficient documentation

## 2016-11-29 DIAGNOSIS — R1319 Other dysphagia: Secondary | ICD-10-CM | POA: Insufficient documentation

## 2016-11-29 DIAGNOSIS — E119 Type 2 diabetes mellitus without complications: Secondary | ICD-10-CM | POA: Insufficient documentation

## 2016-11-30 ENCOUNTER — Telehealth: Payer: Self-pay | Admitting: Medical

## 2016-11-30 NOTE — Telephone Encounter (Signed)
See separate message about delay in getting results.   Call and see how he is doing?  I think he has finished the antibiotics.   I was curious if he is feeling 50%, 75%, 90% better?    I want to make sure he is eating a variety of nutrients, fruits, vegetables, grains, lean meats such as fish, [place], chicken?    The labs from 11/15/16 still show anemia, but white counts are improving.

## 2016-11-30 NOTE — Telephone Encounter (Signed)
See other messages from today.   I finally got an overnight oximetry report today, more than a week after I requested this.  He did have hypoxemia. If he is agreeable AND ASSUMING he is not smoking or having any open flame in his bedroom, we can order overnight oxygen to use if he can't tolerate CPAP.    Open flame +oxygen can cause explosion/fire.    Thus if he wants to try oxygen I think it could be helping with breathing, fatigue, but he has to take safety precautions.

## 2016-11-30 NOTE — Telephone Encounter (Signed)
Made pt an appt. To come in

## 2016-11-30 NOTE — Telephone Encounter (Signed)
Please help me on this.      I have YET to see the overnight pulse oximetry.  I never got a report.   Please inquire and I would like to know why I haven't gotten a report for something I ordered over a week ago?  Similarly, I received [facility] results yesterday for labs that were drawn 11/15/16!  Someone from either [place] or [facility] should be able to explain why there was such a delay in getting results.   This is absurd

## 2016-11-30 NOTE — Telephone Encounter (Signed)
Made him an appt to come to be seen , his wife has a lot over with you , his is feeling better .

## 2016-12-06 ENCOUNTER — Ambulatory Visit: Payer: Self-pay | Admitting: Medical

## 2016-12-07 DIAGNOSIS — Z48813 Encounter for surgical aftercare following surgery on the respiratory system: Secondary | ICD-10-CM | POA: Diagnosis not present

## 2016-12-07 DIAGNOSIS — E119 Type 2 diabetes mellitus without complications: Secondary | ICD-10-CM | POA: Diagnosis not present

## 2016-12-07 DIAGNOSIS — I214 Non-ST elevation (NSTEMI) myocardial infarction: Secondary | ICD-10-CM | POA: Diagnosis not present

## 2016-12-07 DIAGNOSIS — Z452 Encounter for adjustment and management of vascular access device: Secondary | ICD-10-CM | POA: Diagnosis not present

## 2016-12-07 DIAGNOSIS — J181 Lobar pneumonia, unspecified organism: Secondary | ICD-10-CM | POA: Diagnosis not present

## 2016-12-07 DIAGNOSIS — J869 Pyothorax without fistula: Secondary | ICD-10-CM | POA: Diagnosis not present

## 2016-12-11 ENCOUNTER — Telehealth: Payer: Self-pay | Admitting: Medical

## 2016-12-11 ENCOUNTER — Ambulatory Visit (INDEPENDENT_AMBULATORY_CARE_PROVIDER_SITE_OTHER): Payer: Medicare Other | Admitting: Medical

## 2016-12-11 ENCOUNTER — Other Ambulatory Visit: Payer: Self-pay

## 2016-12-11 ENCOUNTER — Encounter: Payer: Self-pay | Admitting: Medical

## 2016-12-11 VITALS — BP 110/66 | HR 83 | Wt 264.6 lb

## 2016-12-11 DIAGNOSIS — R933 Abnormal findings on diagnostic imaging of other parts of digestive tract: Secondary | ICD-10-CM | POA: Diagnosis not present

## 2016-12-11 DIAGNOSIS — Z9289 Personal history of other medical treatment: Secondary | ICD-10-CM

## 2016-12-11 DIAGNOSIS — M47812 Spondylosis without myelopathy or radiculopathy, cervical region: Secondary | ICD-10-CM | POA: Diagnosis not present

## 2016-12-11 DIAGNOSIS — M47817 Spondylosis without myelopathy or radiculopathy, lumbosacral region: Secondary | ICD-10-CM | POA: Diagnosis not present

## 2016-12-11 DIAGNOSIS — I10 Essential (primary) hypertension: Secondary | ICD-10-CM

## 2016-12-11 DIAGNOSIS — F17200 Nicotine dependence, unspecified, uncomplicated: Secondary | ICD-10-CM | POA: Diagnosis not present

## 2016-12-11 DIAGNOSIS — R0902 Hypoxemia: Secondary | ICD-10-CM

## 2016-12-11 DIAGNOSIS — Z79891 Long term (current) use of opiate analgesic: Secondary | ICD-10-CM | POA: Diagnosis not present

## 2016-12-11 DIAGNOSIS — G4733 Obstructive sleep apnea (adult) (pediatric): Secondary | ICD-10-CM | POA: Diagnosis not present

## 2016-12-11 DIAGNOSIS — E118 Type 2 diabetes mellitus with unspecified complications: Secondary | ICD-10-CM | POA: Diagnosis not present

## 2016-12-11 DIAGNOSIS — Z8619 Personal history of other infectious and parasitic diseases: Secondary | ICD-10-CM

## 2016-12-11 DIAGNOSIS — E785 Hyperlipidemia, unspecified: Secondary | ICD-10-CM

## 2016-12-11 DIAGNOSIS — R0603 Acute respiratory distress: Secondary | ICD-10-CM

## 2016-12-11 DIAGNOSIS — G894 Chronic pain syndrome: Secondary | ICD-10-CM | POA: Diagnosis not present

## 2016-12-11 DIAGNOSIS — Z79899 Other long term (current) drug therapy: Secondary | ICD-10-CM | POA: Diagnosis not present

## 2016-12-11 DIAGNOSIS — M546 Pain in thoracic spine: Secondary | ICD-10-CM | POA: Diagnosis not present

## 2016-12-11 LAB — CBC WITH DIFFERENTIAL/PLATELET
BASOS ABS: 0 {cells}/uL (ref 0–200)
Basophils Relative: 0 %
EOS PCT: 4 %
Eosinophils Absolute: 460 cells/uL (ref 15–500)
HCT: 39.4 % (ref 38.5–50.0)
HEMOGLOBIN: 12.9 g/dL — AB (ref 13.2–17.1)
LYMPHS PCT: 20 %
Lymphs Abs: 2300 cells/uL (ref 850–3900)
MCH: 28.5 pg (ref 27.0–33.0)
MCHC: 32.7 g/dL (ref 32.0–36.0)
MCV: 87 fL (ref 80.0–100.0)
MONOS PCT: 7 %
MPV: 9.5 fL (ref 7.5–12.5)
Monocytes Absolute: 805 cells/uL (ref 200–950)
NEUTROS PCT: 69 %
Neutro Abs: 7935 cells/uL — ABNORMAL HIGH (ref 1500–7800)
PLATELETS: 369 10*3/uL (ref 140–400)
RBC: 4.53 MIL/uL (ref 4.20–5.80)
RDW: 14.1 % (ref 11.0–15.0)
WBC: 11.5 10*3/uL — ABNORMAL HIGH (ref 4.0–10.5)

## 2016-12-11 LAB — HEMOGLOBIN A1C
HEMOGLOBIN A1C: 5.2 % (ref <5.7)
Mean Plasma Glucose: 103 mg/dL

## 2016-12-11 MED ORDER — PRAVASTATIN SODIUM 20 MG PO TABS: 20.0000 mg | ORAL_TABLET | Freq: Every day | ORAL | 3 refills | Status: DC

## 2016-12-11 NOTE — Progress Notes (Signed)
Subjective Chief Complaint  Patient presents with  . follow up  from pluse ox    follow up from , referral to speech therp.    Here for f/u.   I saw him 11/12/16 for hospital f/u for sepsis.  He wasn't doing well at that time.   On that visit he still had several days of antibiotics left, was recovering.   He was hospitalized 10/27/16 - 11/08/16 for septic shock, acute kidney injury, CAP, hypoxemic respiratory failure, elevated troponin, preop cardiovascular exam.  See 11/12/16 visit notes for more info.  He has hx/o bipolar disorder, chronic pain, diabetes, hx/o DVT, hypertension, OSA but noncompliant with CPAP, medication noncompliance  Today he reports feels improved overall since last visit.  Has one more f/u planned with cardiothoracic surgery.  Sleeping ok.   Not using CPAP, can't tolerate it.  Still using Pulmicort BID.  Using the xopenex BID with Pulmicort.   This seems to be helping his breathing.  Is suppose to have speech therapy based on abnormal swallow test in December.  Needs referral to speech therapy.  Lives in [place] been checking his blood sugars.   Diet is good, not eating sweets.  He describes his diet as  Healthy.  Not checking BPs.   No other aggravating or relieving factors. No other complaint.   Past Medical History:  Diagnosis Date  . Anxiety   . Back pain, chronic    mid- and lower back  . Bipolar disorder ([place])   . Depression   . Diet-controlled diabetes mellitus ([place])   . History of DVT (deep vein thrombosis) 07/2010   right leg  . History of MRSA infection 2013   thigh  . Hyperlipidemia    no current med.  . Hypertension    no current med.  . Osteoarthritis   . Sleep apnea    uses CPAP nightly   Current Outpatient Prescriptions on File Prior to Visit  Medication Sig Dispense Refill  . alprazolam (XANAX) 2 MG tablet Take 1 tablet (2 mg total) by mouth 3 (three) times daily as needed for anxiety. 20 tablet 0  .  amphetamine-dextroamphetamine (ADDERALL) 20 MG tablet Take 30 mg by mouth 2 (two) times daily.     . budesonide (PULMICORT) 0.5 MG/2ML nebulizer solution USE 2 ML(0.5 MG) VIA NEBULIZER TWICE DAILY 360 mL 0  . diclofenac (VOLTAREN) 75 MG EC tablet TAKE 1 TABLET(75 MG) BY MOUTH TWICE DAILY 30 tablet 0  . famotidine (PEPCID) 40 MG/5ML suspension Take 2.5 mLs (20 mg total) by mouth 2 (two) times daily. 50 mL 2  . levalbuterol (XOPENEX) 0.63 MG/3ML nebulizer solution USE 1 VIAL(3 ML) IN NEBULIZER EVERY 6 HOURS AS NEEDED FOR WHEEZING OR SHORTNESS OF BREATH 1125 mL 1   No current facility-administered medications on file prior to visit.    ROS as in subjective    Objective BP 110/66   Pulse 83   Wt 264 lb 9.6 oz (120 kg)   SpO2 96%   BMI 33.07 kg/m   Wt Readings from Last 3 Encounters:  12/11/16 264 lb 9.6 oz (120 kg)  11/19/16 260 lb (117.9 kg)  11/12/16 260 lb 3.2 oz (118 kg)   Gen: wd, wn, nad, much improved appearing compared to last visit Cap refill normal Heart rrr, normal s1, s2, no murmurs Ext: no edema Pulses WNL Lungs: decreased right lung sounds somewhat, no wheezing rhonchi or rales Oral: MMM hent unremarkable Right chest wall anterolaterally  with 2 linear and 3 stellate surgical scars, slightly tender without erythema, induration or fluctuance   Assessment: Encounter Diagnoses  Name Primary?  . Diabetes mellitus with complication ([place]) Yes  . Tobacco use disorder   . Essential hypertension, benign   . OSA (obstructive sleep apnea)   . Hyperlipidemia, unspecified hyperlipidemia type   . History of recent hospitalization   . Abnormal barium swallow   . Hypoxemia   . History of sepsis      Plan: History of hypertension - controlled off medication for now  Diabetes - last HgbA1C was 4 months ago at 5.5% before the hospitalization.    History of diabetes and hyperlipidemia - lipid panel from 4 mo ago reviewed.  HDL low at 38, LDL  98.  Recommendations:  Glad to hear you are making improvements  We are checking labs today  For now continue Pulmicort and Albuterol by nebulizer twice daily for the next 3- 4 weeks  I want to recheck or at least hear back from you within a month regarding whether we need to continue nebulizer twice daily versus as needed at that time  We are checking status of oxygen as I have ordered oxygen on you for night time use, 2 liters per minute setting at night time only.  DON"T SMOKE OR have open FLAME in the room with oxygen.   QUIT SMOKING!  I refilled glucometer testing supplies. I'd like you to check your morning sugars a few times per week to make sure they are staying under 130.  If not running under 130, then recheck  Start back on Pravachol cholesterol pill at bed time to lower heart disease risk and cholesterol  Follow up with the cardiothoracic surgeon as planned  Gradually increase your exercise tolerance   Eric Hayes was seen today for follow up  from pluse ox.  Diagnoses and all orders for this visit:  Diabetes mellitus with complication ([place]) -     CBC with Differential/Platelet -     Hemoglobin A1c -     Microalbumin / creatinine urine ratio  Tobacco use disorder -     Ambulatory referral to Speech Therapy  Essential hypertension, benign  OSA (obstructive sleep apnea) -     Ambulatory referral to Speech Therapy  Hyperlipidemia, unspecified hyperlipidemia type  History of recent hospitalization -     CBC with Differential/Platelet  Abnormal barium swallow -     Ambulatory referral to Speech Therapy  Hypoxemia -     CBC with Differential/Platelet -     Ambulatory referral to Speech Therapy  History of sepsis -     CBC with Differential/Platelet

## 2016-12-11 NOTE — Telephone Encounter (Signed)
Check on status of night time oxygen 2 L/min.  I had ordered this when I had gotten back his abnormal overnight oximetry but he has not heard about oxygen supply from home health or supplier.   Refer to speech therapy close to his home in [place].

## 2016-12-11 NOTE — Patient Instructions (Signed)
Recommendations:  Glad to hear you are making improvements  We are checking labs today  For now continue Pulmicort and Albuterol by nebulizer twice daily for the next 3- 4 weeks  I want to recheck or at least hear back from you within a month regarding whether we need to continue nebulizer twice daily versus as needed at that time  We are checking status of oxygen as I have ordered oxygen on you for night time use, 2 liters per minute setting at night time only.  DON"T SMOKE OR have open FLAME in the room with oxygen.   QUIT SMOKING!  I refilled glucometer testing supplies. I'd like you to check your morning sugars a few times per week to make sure they are staying under 130.  If not running under 130, then recheck  Start back on Pravachol cholesterol pill at bed time to lower heart disease risk and cholesterol  Follow up with the cardiothoracic surgeon as planned  Gradually increase your exercise tolerance

## 2016-12-11 NOTE — Telephone Encounter (Signed)
[facility]ent order for [facility] to [facility] in [place] with Jamie Rt. She is getting him set up also sent order to p.t in [facility] for speech therapy .

## 2016-12-12 LAB — MICROALBUMIN / CREATININE URINE RATIO
Creatinine, Urine: 334 mg/dL (ref 20–370)
MICROALB/CREAT RATIO: 3 ug/mg{creat} (ref <30)
Microalb, Ur: 0.9 mg/dL

## 2016-12-18 ENCOUNTER — Telehealth: Payer: Self-pay

## 2016-12-18 DIAGNOSIS — J869 Pyothorax without fistula: Secondary | ICD-10-CM | POA: Diagnosis not present

## 2016-12-18 DIAGNOSIS — I214 Non-ST elevation (NSTEMI) myocardial infarction: Secondary | ICD-10-CM | POA: Diagnosis not present

## 2016-12-18 DIAGNOSIS — E119 Type 2 diabetes mellitus without complications: Secondary | ICD-10-CM | POA: Diagnosis not present

## 2016-12-18 DIAGNOSIS — J181 Lobar pneumonia, unspecified organism: Secondary | ICD-10-CM | POA: Diagnosis not present

## 2016-12-18 DIAGNOSIS — Z452 Encounter for adjustment and management of vascular access device: Secondary | ICD-10-CM | POA: Diagnosis not present

## 2016-12-18 DIAGNOSIS — Z48813 Encounter for surgical aftercare following surgery on the respiratory system: Secondary | ICD-10-CM | POA: Diagnosis not present

## 2016-12-18 NOTE — Telephone Encounter (Signed)
Eric Hayes with [facility]- she was not able to see pt last week due to not being able to reach him via phone and no one answered the door. She will attempted again today and call with update. [phone]

## 2016-12-21 DIAGNOSIS — F4310 Post-traumatic stress disorder, unspecified: Secondary | ICD-10-CM | POA: Diagnosis not present

## 2016-12-21 DIAGNOSIS — Z79899 Other long term (current) drug therapy: Secondary | ICD-10-CM | POA: Diagnosis not present

## 2016-12-21 DIAGNOSIS — F310 Bipolar disorder, current episode hypomanic: Secondary | ICD-10-CM | POA: Diagnosis not present

## 2016-12-24 ENCOUNTER — Other Ambulatory Visit: Payer: Self-pay | Admitting: Thoracic Surgery (Cardiothoracic Vascular Surgery)

## 2016-12-24 DIAGNOSIS — Z48813 Encounter for surgical aftercare following surgery on the respiratory system: Secondary | ICD-10-CM | POA: Diagnosis not present

## 2016-12-24 DIAGNOSIS — J9601 Acute respiratory failure with hypoxia: Secondary | ICD-10-CM

## 2016-12-24 DIAGNOSIS — J181 Lobar pneumonia, unspecified organism: Secondary | ICD-10-CM | POA: Diagnosis not present

## 2016-12-24 DIAGNOSIS — Z452 Encounter for adjustment and management of vascular access device: Secondary | ICD-10-CM | POA: Diagnosis not present

## 2016-12-24 DIAGNOSIS — J869 Pyothorax without fistula: Secondary | ICD-10-CM | POA: Diagnosis not present

## 2016-12-24 DIAGNOSIS — E119 Type 2 diabetes mellitus without complications: Secondary | ICD-10-CM | POA: Diagnosis not present

## 2016-12-24 DIAGNOSIS — I214 Non-ST elevation (NSTEMI) myocardial infarction: Secondary | ICD-10-CM | POA: Diagnosis not present

## 2016-12-25 ENCOUNTER — Ambulatory Visit
Admission: RE | Admit: 2016-12-25 | Discharge: 2016-12-25 | Disposition: A | Discharge status: Home / Self Care | Payer: Medicare Other | Source: Ambulatory Visit | Attending: Thoracic Surgery (Cardiothoracic Vascular Surgery) | Admitting: Thoracic Surgery (Cardiothoracic Vascular Surgery)

## 2016-12-25 ENCOUNTER — Encounter: Payer: Self-pay | Admitting: Internal Medicine

## 2016-12-25 ENCOUNTER — Ambulatory Visit (INDEPENDENT_AMBULATORY_CARE_PROVIDER_SITE_OTHER): Payer: Self-pay | Admitting: Thoracic Surgery (Cardiothoracic Vascular Surgery)

## 2016-12-25 ENCOUNTER — Encounter: Payer: Self-pay | Admitting: Thoracic Surgery (Cardiothoracic Vascular Surgery)

## 2016-12-25 VITALS — BP 119/80 | HR 127 | Temp 98.4°F | Resp 22 | Ht 75.0 in | Wt 270.6 lb

## 2016-12-25 DIAGNOSIS — J869 Pyothorax without fistula: Secondary | ICD-10-CM

## 2016-12-25 DIAGNOSIS — Z09 Encounter for follow-up examination after completed treatment for conditions other than malignant neoplasm: Secondary | ICD-10-CM

## 2016-12-25 DIAGNOSIS — J9601 Acute respiratory failure with hypoxia: Secondary | ICD-10-CM

## 2016-12-25 NOTE — Progress Notes (Signed)
[address]       [place] [address]             [phone]    HPI: Eric Hayes returns today for a scheduled follow-up visit.  He is a 41 year old man who had right VATS and drainage of an early organizing empyema on 10/31/2016. He was noted to have a right lower lobe lung abscess at the time of surgery. He saw Eric Hayes, [place] on 12/18 and was progressing at that time.  He is not having excessive pain from his incision. He has sleep apnea but refuses to use CPAP.  Eric Hayes has prescribed home oxygen use at night. He gets short of breath with exertion. He is still smoking "a few cigarettes now and then."  Past Medical History:  Diagnosis Date  . Anxiety   . Back pain, chronic    mid- and lower back  . Bipolar disorder ([place])   . Depression   . Diet-controlled diabetes mellitus ([place])   . History of DVT (deep vein thrombosis) 07/2010   right leg  . History of MRSA infection 2013   thigh  . Hyperlipidemia    no current med.  . Hypertension    no current med.  . Osteoarthritis   . Sleep apnea    uses CPAP nightly     Current Outpatient Prescriptions  Medication Sig Dispense Refill  . alprazolam (XANAX) 2 MG tablet Take 1 tablet (2 mg total) by mouth 3 (three) times daily as needed for anxiety. 20 tablet 0  . amphetamine-dextroamphetamine (ADDERALL) 20 MG tablet Take 30 mg by mouth 2 (two) times daily.     . budesonide (PULMICORT) 0.5 MG/2ML nebulizer solution USE 2 ML(0.5 MG) VIA NEBULIZER TWICE DAILY 360 mL 0  . diclofenac (VOLTAREN) 75 MG EC tablet TAKE 1 TABLET(75 MG) BY MOUTH TWICE DAILY 30 tablet 0  . famotidine (PEPCID) 40 MG/5ML suspension Take 2.5 mLs (20 mg total) by mouth 2 (two) times daily. 50 mL 2  . levalbuterol (XOPENEX) 0.63 MG/3ML nebulizer solution USE 1 VIAL(3 ML) IN NEBULIZER EVERY 6 HOURS AS NEEDED FOR WHEEZING OR SHORTNESS OF BREATH 1125 mL 1  . pravastatin (PRAVACHOL) 20 MG tablet Take 1 tablet (20 mg total) by mouth daily.  90 tablet 3   No current facility-administered medications for this visit.     Physical Exam BP 119/80 (BP Location: Right Arm, Patient Position: Sitting, Cuff Size: Large)   Pulse (!) 127   Temp 98.4 F (36.9 C) (Oral)   Resp (!) 22   Ht 6\' 3"  (1.905 m)   Wt 270 lb 9.6 oz (122.7 kg)   SpO2 99% Comment: ON RA  BMI 33.5 kg/m  41 year old man in no acute distress Alert and oriented 3 with no focal deficits Cardiac tachycardic, regular Lungs mildly decreased at right base, faint wheezing bilaterally Incisions well healed  Diagnostic Tests: CHEST  2 VIEW  COMPARISON:  PA and lateral chest x-ray of November 19, 2016  FINDINGS: The left lung is well-expanded and clear. On the right there is chronic volume loss centered at the lung base. There is a small right pleural effusion. The patchy interstitial and alveolar opacities observed on the previous study have improved. The heart and pulmonary vascularity are normal. The mediastinum is normal in width. The trachea is midline. The bony thorax exhibits no acute abnormality.  IMPRESSION: Chronic volume loss on the right with small right pleural effusion and interstitial prominence.  No definite acute pneumonia. Probable underlying COPD or reactive airway disease. If there are strong clinical concerns of read current pneumonia or other active pulmonary process at the right lung base, chest CT scanning now would be a useful next imaging step.   Electronically Signed   By: Eric  Hayes M.D.   On: 12/25/2016 10:04    I personally reviewed the CT chest. It shows continuing to stop evolution. Improved aeration in the right lower lobe compared to his previous film.  Impression: 41 year old man who had a lung abscess which developed into an early organizing empyema which was drained with right VATS on 10/31/2016. From a surgical standpoint he's doing well. His chest x-ray shows continued improvement in aeration at the right  base. His incisions are healing well. He is not requiring narcotics for pain.  From my standpoint there are no restrictions on his activities. I encouraged him to exercise on a regular basis.  Smoking cessation instruction/counseling given:  counseled patient on the dangers of tobacco use, advised patient to stop smoking, and reviewed strategies to maximize success. I emphasized the importance of tobacco cessation for both his current and long-term health.  Tachycardia- will check a rhythm strip in the office. Strip showed sinus tachycardia.   Plan:  I will be happy to see Eric Hayes back any time in the future if I can be of any further assistance with his care.  Eric Nakayama, MD [facility] [phone]

## 2017-01-01 DIAGNOSIS — J869 Pyothorax without fistula: Secondary | ICD-10-CM | POA: Diagnosis not present

## 2017-01-01 DIAGNOSIS — Z48813 Encounter for surgical aftercare following surgery on the respiratory system: Secondary | ICD-10-CM | POA: Diagnosis not present

## 2017-01-01 DIAGNOSIS — Z452 Encounter for adjustment and management of vascular access device: Secondary | ICD-10-CM | POA: Diagnosis not present

## 2017-01-01 DIAGNOSIS — J181 Lobar pneumonia, unspecified organism: Secondary | ICD-10-CM | POA: Diagnosis not present

## 2017-01-01 DIAGNOSIS — E119 Type 2 diabetes mellitus without complications: Secondary | ICD-10-CM | POA: Diagnosis not present

## 2017-01-01 DIAGNOSIS — I214 Non-ST elevation (NSTEMI) myocardial infarction: Secondary | ICD-10-CM | POA: Diagnosis not present

## 2017-01-14 DIAGNOSIS — M546 Pain in thoracic spine: Secondary | ICD-10-CM | POA: Diagnosis not present

## 2017-01-14 DIAGNOSIS — Z79899 Other long term (current) drug therapy: Secondary | ICD-10-CM | POA: Diagnosis not present

## 2017-01-14 DIAGNOSIS — M47812 Spondylosis without myelopathy or radiculopathy, cervical region: Secondary | ICD-10-CM | POA: Diagnosis not present

## 2017-01-14 DIAGNOSIS — G894 Chronic pain syndrome: Secondary | ICD-10-CM | POA: Diagnosis not present

## 2017-01-14 DIAGNOSIS — Z79891 Long term (current) use of opiate analgesic: Secondary | ICD-10-CM | POA: Diagnosis not present

## 2017-01-14 DIAGNOSIS — M47817 Spondylosis without myelopathy or radiculopathy, lumbosacral region: Secondary | ICD-10-CM | POA: Diagnosis not present

## 2017-01-22 ENCOUNTER — Telehealth: Payer: Self-pay

## 2017-01-22 NOTE — Telephone Encounter (Signed)
Per jamie with [facility] pt was going to be set up in 12/04/2016 pt's wife put oxygen on hold and now 2 months laterwants him to have it. per jamie with [facility] he will need to have the over night pulse repeated to because it expires after 30 days ,do you want this repeated.

## 2017-01-22 NOTE — Telephone Encounter (Signed)
Wife states pt never received oxygen tanks as ordered. She questions if she needs to call back to original company order placed with or call different company. She can be reached at [phone]. Thanks, Wells Guiles

## 2017-01-23 NOTE — Telephone Encounter (Signed)
If he still feels some SOB, then first reiterate that he needs to answer the phone, make sure we have correct phone numbers, repeat request for overnight oxygen, and then we can try again to get insurance to cover this

## 2017-01-25 NOTE — Telephone Encounter (Signed)
Spoke with jamie r.t. @ [facility]  Eric Hayes that she is not sure if the insurance will cover it since the refused the oxygen the first time. They aren't not sure until they bill but  usually  They don't . Tried to call mr.Hum but his phone has block on his phone.

## 2017-01-25 NOTE — Telephone Encounter (Signed)
Called and l/m for jamie to call me back.

## 2017-02-20 ENCOUNTER — Emergency Department (HOSPITAL_COMMUNITY)
Admission: EM | Admit: 2017-02-20 | Discharge: 2017-02-20 | Disposition: A | Discharge status: Home / Self Care | Payer: Medicare Other | Attending: Dermatology | Admitting: Dermatology

## 2017-02-20 ENCOUNTER — Encounter (HOSPITAL_COMMUNITY): Payer: Self-pay | Admitting: *Deleted

## 2017-02-20 ENCOUNTER — Emergency Department (HOSPITAL_COMMUNITY): Payer: Medicare Other

## 2017-02-20 DIAGNOSIS — Z5321 Procedure and treatment not carried out due to patient leaving prior to being seen by health care provider: Secondary | ICD-10-CM | POA: Diagnosis not present

## 2017-02-20 DIAGNOSIS — Z79899 Other long term (current) drug therapy: Secondary | ICD-10-CM | POA: Insufficient documentation

## 2017-02-20 DIAGNOSIS — H5203 Hypermetropia, bilateral: Secondary | ICD-10-CM | POA: Diagnosis not present

## 2017-02-20 DIAGNOSIS — R05 Cough: Secondary | ICD-10-CM | POA: Diagnosis not present

## 2017-02-20 DIAGNOSIS — E119 Type 2 diabetes mellitus without complications: Secondary | ICD-10-CM | POA: Diagnosis not present

## 2017-02-20 DIAGNOSIS — I10 Essential (primary) hypertension: Secondary | ICD-10-CM | POA: Insufficient documentation

## 2017-02-20 DIAGNOSIS — R079 Chest pain, unspecified: Secondary | ICD-10-CM | POA: Insufficient documentation

## 2017-02-20 DIAGNOSIS — F17210 Nicotine dependence, cigarettes, uncomplicated: Secondary | ICD-10-CM | POA: Insufficient documentation

## 2017-02-20 DIAGNOSIS — R0602 Shortness of breath: Secondary | ICD-10-CM | POA: Diagnosis not present

## 2017-02-20 LAB — BASIC METABOLIC PANEL
ANION GAP: 9 (ref 5–15)
BUN: 8 mg/dL (ref 6–20)
CO2: 27 mmol/L (ref 22–32)
Calcium: 9.3 mg/dL (ref 8.9–10.3)
Chloride: 101 mmol/L (ref 101–111)
Creatinine, Ser: 0.77 mg/dL (ref 0.61–1.24)
Glucose, Bld: 105 mg/dL — ABNORMAL HIGH (ref 65–99)
POTASSIUM: 3.6 mmol/L (ref 3.5–5.1)
Sodium: 137 mmol/L (ref 135–145)

## 2017-02-20 LAB — CBC
HCT: 47.6 % (ref 39.0–52.0)
Hemoglobin: 15.6 g/dL (ref 13.0–17.0)
MCH: 29.5 pg (ref 26.0–34.0)
MCHC: 32.8 g/dL (ref 30.0–36.0)
MCV: 90 fL (ref 78.0–100.0)
Platelets: 403 10*3/uL — ABNORMAL HIGH (ref 150–400)
RBC: 5.29 MIL/uL (ref 4.22–5.81)
RDW: 14 % (ref 11.5–15.5)
WBC: 12.8 10*3/uL — ABNORMAL HIGH (ref 4.0–10.5)

## 2017-02-20 LAB — TROPONIN I: Troponin I: <0.03 ng/mL (ref <0.03)

## 2017-02-20 NOTE — ED Notes (Signed)
Pt states he is tired of waiting and is going to his pcp Friday.

## 2017-02-20 NOTE — ED Triage Notes (Signed)
Pt c/o SOB with exertion and non-productive cough that started 2 days ago and has been progressively getting worse. Pt also c/o mid chest pain that started today with radiation to the back. Pt denies dizziness, nausea, vomiting.

## 2017-02-22 ENCOUNTER — Other Ambulatory Visit: Payer: Self-pay | Admitting: Medical

## 2017-02-22 DIAGNOSIS — M5137 Other intervertebral disc degeneration, lumbosacral region: Secondary | ICD-10-CM | POA: Diagnosis not present

## 2017-02-22 DIAGNOSIS — Z79899 Other long term (current) drug therapy: Secondary | ICD-10-CM | POA: Diagnosis not present

## 2017-02-22 DIAGNOSIS — Z79891 Long term (current) use of opiate analgesic: Secondary | ICD-10-CM | POA: Diagnosis not present

## 2017-02-22 DIAGNOSIS — G894 Chronic pain syndrome: Secondary | ICD-10-CM | POA: Diagnosis not present

## 2017-02-22 NOTE — Telephone Encounter (Signed)
Is this okay to refill? 

## 2017-02-25 ENCOUNTER — Telehealth: Payer: Self-pay | Admitting: Medical

## 2017-02-25 NOTE — Telephone Encounter (Signed)
Wife Claiborne Billings  called & stated pt hasn't been able to get his nebulizer meds.  Called pharmacy & they state that needs certificate of medical necessity for both Xopenex & Budesonide.  Spoke with Audelia Acton & he states those were already completed and faxed back.

## 2017-02-26 NOTE — Telephone Encounter (Signed)
Called [facility] [phone] & they do not have the CMN forms for the breathing meds only the diabetes medications.  They will refax these to me, but must be ICD 10 code for pulmonary condition states after faxed back came be approved same day.

## 2017-02-26 NOTE — Telephone Encounter (Signed)
CMN form signed & faxed back to [facility]

## 2017-03-04 DIAGNOSIS — Z79899 Other long term (current) drug therapy: Secondary | ICD-10-CM | POA: Diagnosis not present

## 2017-03-04 DIAGNOSIS — F250 Schizoaffective disorder, bipolar type: Secondary | ICD-10-CM | POA: Diagnosis not present

## 2017-03-25 DIAGNOSIS — M549 Dorsalgia, unspecified: Secondary | ICD-10-CM | POA: Diagnosis not present

## 2017-03-25 DIAGNOSIS — R200 Anesthesia of skin: Secondary | ICD-10-CM | POA: Diagnosis not present

## 2017-03-25 DIAGNOSIS — Z79899 Other long term (current) drug therapy: Secondary | ICD-10-CM | POA: Diagnosis not present

## 2017-03-25 DIAGNOSIS — M79604 Pain in right leg: Secondary | ICD-10-CM | POA: Diagnosis not present

## 2017-03-25 DIAGNOSIS — Z79891 Long term (current) use of opiate analgesic: Secondary | ICD-10-CM | POA: Diagnosis not present

## 2017-03-25 DIAGNOSIS — M545 Low back pain: Secondary | ICD-10-CM | POA: Diagnosis not present

## 2017-03-25 DIAGNOSIS — M5136 Other intervertebral disc degeneration, lumbar region: Secondary | ICD-10-CM | POA: Diagnosis not present

## 2017-03-25 DIAGNOSIS — G894 Chronic pain syndrome: Secondary | ICD-10-CM | POA: Diagnosis not present

## 2017-03-25 DIAGNOSIS — M79606 Pain in leg, unspecified: Secondary | ICD-10-CM | POA: Diagnosis not present

## 2017-03-25 DIAGNOSIS — M47816 Spondylosis without myelopathy or radiculopathy, lumbar region: Secondary | ICD-10-CM | POA: Diagnosis not present

## 2017-03-29 ENCOUNTER — Other Ambulatory Visit: Payer: Self-pay | Admitting: Pain Medicine

## 2017-03-29 DIAGNOSIS — M545 Low back pain: Secondary | ICD-10-CM

## 2017-04-07 ENCOUNTER — Other Ambulatory Visit: Payer: Medicare Other

## 2017-04-14 ENCOUNTER — Ambulatory Visit
Admission: RE | Admit: 2017-04-14 | Discharge: 2017-04-14 | Disposition: A | Discharge status: Home / Self Care | Payer: Medicare Other | Source: Ambulatory Visit | Attending: Pain Medicine | Admitting: Pain Medicine

## 2017-04-14 DIAGNOSIS — M79652 Pain in left thigh: Secondary | ICD-10-CM | POA: Diagnosis not present

## 2017-04-14 DIAGNOSIS — M79651 Pain in right thigh: Secondary | ICD-10-CM | POA: Diagnosis not present

## 2017-04-14 DIAGNOSIS — M545 Low back pain: Secondary | ICD-10-CM

## 2017-04-17 ENCOUNTER — Ambulatory Visit (INDEPENDENT_AMBULATORY_CARE_PROVIDER_SITE_OTHER): Payer: Medicare Other | Admitting: Medical

## 2017-04-17 ENCOUNTER — Encounter: Payer: Self-pay | Admitting: Medical

## 2017-04-17 NOTE — Progress Notes (Signed)
Of note: Patient left before provider could see him due to lengthy difficult patient right before Eric Hayes's appt.

## 2017-04-24 DIAGNOSIS — Z79891 Long term (current) use of opiate analgesic: Secondary | ICD-10-CM | POA: Diagnosis not present

## 2017-04-24 DIAGNOSIS — M549 Dorsalgia, unspecified: Secondary | ICD-10-CM | POA: Diagnosis not present

## 2017-04-24 DIAGNOSIS — M47812 Spondylosis without myelopathy or radiculopathy, cervical region: Secondary | ICD-10-CM | POA: Diagnosis not present

## 2017-04-24 DIAGNOSIS — M542 Cervicalgia: Secondary | ICD-10-CM | POA: Diagnosis not present

## 2017-04-24 DIAGNOSIS — M47816 Spondylosis without myelopathy or radiculopathy, lumbar region: Secondary | ICD-10-CM | POA: Diagnosis not present

## 2017-04-24 DIAGNOSIS — G894 Chronic pain syndrome: Secondary | ICD-10-CM | POA: Diagnosis not present

## 2017-04-24 DIAGNOSIS — Z79899 Other long term (current) drug therapy: Secondary | ICD-10-CM | POA: Diagnosis not present

## 2017-05-27 DIAGNOSIS — F310 Bipolar disorder, current episode hypomanic: Secondary | ICD-10-CM | POA: Diagnosis not present

## 2017-05-27 DIAGNOSIS — Z79899 Other long term (current) drug therapy: Secondary | ICD-10-CM | POA: Diagnosis not present

## 2017-05-29 DIAGNOSIS — Z79899 Other long term (current) drug therapy: Secondary | ICD-10-CM | POA: Diagnosis not present

## 2017-05-29 DIAGNOSIS — Z79891 Long term (current) use of opiate analgesic: Secondary | ICD-10-CM | POA: Diagnosis not present

## 2017-05-29 DIAGNOSIS — M79606 Pain in leg, unspecified: Secondary | ICD-10-CM | POA: Diagnosis not present

## 2017-05-29 DIAGNOSIS — M47816 Spondylosis without myelopathy or radiculopathy, lumbar region: Secondary | ICD-10-CM | POA: Diagnosis not present

## 2017-05-29 DIAGNOSIS — M47812 Spondylosis without myelopathy or radiculopathy, cervical region: Secondary | ICD-10-CM | POA: Diagnosis not present

## 2017-05-29 DIAGNOSIS — M542 Cervicalgia: Secondary | ICD-10-CM | POA: Diagnosis not present

## 2017-05-29 DIAGNOSIS — G894 Chronic pain syndrome: Secondary | ICD-10-CM | POA: Diagnosis not present

## 2017-07-02 ENCOUNTER — Emergency Department (HOSPITAL_COMMUNITY): Payer: Medicare Other

## 2017-07-02 ENCOUNTER — Encounter (HOSPITAL_COMMUNITY): Payer: Self-pay | Admitting: Emergency Medicine

## 2017-07-02 ENCOUNTER — Emergency Department (HOSPITAL_COMMUNITY)
Admission: EM | Admit: 2017-07-02 | Discharge: 2017-07-02 | Disposition: A | Discharge status: Home / Self Care | Payer: Medicare Other | Attending: Emergency Medicine | Admitting: Emergency Medicine

## 2017-07-02 DIAGNOSIS — Z79899 Other long term (current) drug therapy: Secondary | ICD-10-CM | POA: Diagnosis not present

## 2017-07-02 DIAGNOSIS — E119 Type 2 diabetes mellitus without complications: Secondary | ICD-10-CM | POA: Diagnosis not present

## 2017-07-02 DIAGNOSIS — I10 Essential (primary) hypertension: Secondary | ICD-10-CM | POA: Diagnosis not present

## 2017-07-02 DIAGNOSIS — J039 Acute tonsillitis, unspecified: Secondary | ICD-10-CM

## 2017-07-02 DIAGNOSIS — F17210 Nicotine dependence, cigarettes, uncomplicated: Secondary | ICD-10-CM | POA: Diagnosis not present

## 2017-07-02 DIAGNOSIS — R221 Localized swelling, mass and lump, neck: Secondary | ICD-10-CM | POA: Diagnosis not present

## 2017-07-02 DIAGNOSIS — R0602 Shortness of breath: Secondary | ICD-10-CM | POA: Diagnosis not present

## 2017-07-02 HISTORY — DX: Other pulmonary collapse: J98.19

## 2017-07-02 LAB — CBC WITH DIFFERENTIAL/PLATELET
Basophils Absolute: 0 10*3/uL (ref 0.0–0.1)
Basophils Relative: 0 %
EOS PCT: 4 %
Eosinophils Absolute: 0.7 10*3/uL (ref 0.0–0.7)
HCT: 46.1 % (ref 39.0–52.0)
Hemoglobin: 14.9 g/dL (ref 13.0–17.0)
LYMPHS PCT: 19 %
Lymphs Abs: 3.3 10*3/uL (ref 0.7–4.0)
MCH: 30.5 pg (ref 26.0–34.0)
MCHC: 32.3 g/dL (ref 30.0–36.0)
MCV: 94.3 fL (ref 78.0–100.0)
Monocytes Absolute: 1 10*3/uL (ref 0.1–1.0)
Monocytes Relative: 6 %
NEUTROS ABS: 12.2 10*3/uL — AB (ref 1.7–7.7)
Neutrophils Relative %: 71 %
Platelets: 348 10*3/uL (ref 150–400)
RBC: 4.89 MIL/uL (ref 4.22–5.81)
RDW: 13.8 % (ref 11.5–15.5)
WBC: 17.3 10*3/uL — AB (ref 4.0–10.5)

## 2017-07-02 LAB — COMPREHENSIVE METABOLIC PANEL
ALK PHOS: 84 U/L (ref 38–126)
ALT: 20 U/L (ref 17–63)
AST: 18 U/L (ref 15–41)
Albumin: 3.7 g/dL (ref 3.5–5.0)
Anion gap: 8 (ref 5–15)
BUN: 7 mg/dL (ref 6–20)
CALCIUM: 9.1 mg/dL (ref 8.9–10.3)
CO2: 24 mmol/L (ref 22–32)
CREATININE: 0.73 mg/dL (ref 0.61–1.24)
Chloride: 112 mmol/L — ABNORMAL HIGH (ref 101–111)
GFR calc Af Amer: >60 mL/min (ref >60)
GFR calc non Af Amer: >60 mL/min (ref >60)
Glucose, Bld: 103 mg/dL — ABNORMAL HIGH (ref 65–99)
Potassium: 3.7 mmol/L (ref 3.5–5.1)
Sodium: 144 mmol/L (ref 135–145)
Total Bilirubin: 0.5 mg/dL (ref 0.3–1.2)
Total Protein: 7.7 g/dL (ref 6.5–8.1)

## 2017-07-02 LAB — RAPID STREP SCREEN (MED CTR MEBANE ONLY): STREPTOCOCCUS, GROUP A SCREEN (DIRECT): NEGATIVE

## 2017-07-02 MED ORDER — SODIUM CHLORIDE 0.9 % IN NEBU
INHALATION_SOLUTION | RESPIRATORY_TRACT | Status: AC
  Administered 2017-07-02: 3 mL
  Filled 2017-07-02: qty 3

## 2017-07-02 MED ORDER — RACEPINEPHRINE HCL 2.25 % IN NEBU
0.5000 mL | INHALATION_SOLUTION | Freq: Once | RESPIRATORY_TRACT | Status: AC
  Administered 2017-07-02: 0.5 mL via RESPIRATORY_TRACT
  Filled 2017-07-02: qty 0.5

## 2017-07-02 MED ORDER — METHYLPREDNISOLONE SODIUM SUCC 125 MG IJ SOLR
125.0000 mg | Freq: Once | INTRAMUSCULAR | Status: AC
Start: 2017-07-02 — End: 2017-07-02
  Administered 2017-07-02: 125 mg via INTRAVENOUS
  Filled 2017-07-02: qty 2

## 2017-07-02 MED ORDER — CEPHALEXIN 500 MG PO CAPS: 500.0000 mg | ORAL_CAPSULE | Freq: Four times a day (QID) | ORAL | 0 refills | Status: DC

## 2017-07-02 MED ORDER — IOPAMIDOL (ISOVUE-300) INJECTION 61%
75.0000 mL | Freq: Once | INTRAVENOUS | Status: AC | PRN
  Administered 2017-07-02: 75 mL via INTRAVENOUS

## 2017-07-02 MED ORDER — FAMOTIDINE IN NACL 20-0.9 MG/50ML-% IV SOLN
20.0000 mg | Freq: Once | INTRAVENOUS | Status: AC
  Administered 2017-07-02: 20 mg via INTRAVENOUS
  Filled 2017-07-02: qty 50

## 2017-07-02 MED ORDER — PREDNISONE 20 MG PO TABS: ORAL_TABLET | ORAL | 0 refills | Status: DC

## 2017-07-02 MED ORDER — DIPHENHYDRAMINE HCL 50 MG/ML IJ SOLN
25.0000 mg | Freq: Once | INTRAMUSCULAR | Status: AC
  Administered 2017-07-02: 25 mg via INTRAVENOUS
  Filled 2017-07-02: qty 1

## 2017-07-02 MED ORDER — LEVOFLOXACIN IN D5W 500 MG/100ML IV SOLN
500.0000 mg | Freq: Once | INTRAVENOUS | Status: AC
  Administered 2017-07-02: 500 mg via INTRAVENOUS
  Filled 2017-07-02: qty 100

## 2017-07-02 NOTE — ED Notes (Signed)
To X-ray

## 2017-07-02 NOTE — ED Triage Notes (Signed)
Pt c/o sob 2 weeks. States throat started feeling like it was swelling x 2 days ago. Pt has labored breathing and diaphoretic. Back of throat is moderate to severly swollen. Lung sounds diminished with exp wheezing. Little to no breath sounds heard to right lower lung. Tongue is not swollen. Pt able to swallow.

## 2017-07-02 NOTE — ED Notes (Signed)
edp in with pt 

## 2017-07-02 NOTE — Discharge Instructions (Signed)
Follow up with Dr. Benjamine Mola or Dr. Erik Obey this week or next
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
## 2017-07-05 LAB — CULTURE, GROUP A STREP (THRC)

## 2017-07-09 ENCOUNTER — Encounter: Payer: Medicare Other | Admitting: Medical

## 2017-07-29 ENCOUNTER — Ambulatory Visit: Payer: Medicare Other | Admitting: Medical

## 2017-09-09 ENCOUNTER — Emergency Department (HOSPITAL_COMMUNITY): Payer: Medicare Other

## 2017-09-09 ENCOUNTER — Ambulatory Visit (INDEPENDENT_AMBULATORY_CARE_PROVIDER_SITE_OTHER): Payer: Medicare Other | Admitting: Medical

## 2017-09-09 ENCOUNTER — Inpatient Hospital Stay (HOSPITAL_COMMUNITY)
Admission: EM | Admit: 2017-09-09 | Discharge: 2017-09-14 | DRG: 193 | Disposition: A | Discharge status: Home / Self Care | Payer: Medicare Other | Attending: Internal Medicine | Admitting: Internal Medicine

## 2017-09-09 ENCOUNTER — Encounter (HOSPITAL_COMMUNITY): Payer: Self-pay | Admitting: Emergency Medicine

## 2017-09-09 ENCOUNTER — Encounter: Payer: Self-pay | Admitting: Medical

## 2017-09-09 VITALS — BP 118/76 | HR 86

## 2017-09-09 DIAGNOSIS — E860 Dehydration: Secondary | ICD-10-CM | POA: Diagnosis present

## 2017-09-09 DIAGNOSIS — J180 Bronchopneumonia, unspecified organism: Principal | ICD-10-CM | POA: Diagnosis present

## 2017-09-09 DIAGNOSIS — R490 Dysphonia: Secondary | ICD-10-CM | POA: Diagnosis present

## 2017-09-09 DIAGNOSIS — G9340 Encephalopathy, unspecified: Secondary | ICD-10-CM | POA: Diagnosis not present

## 2017-09-09 DIAGNOSIS — J189 Pneumonia, unspecified organism: Secondary | ICD-10-CM | POA: Diagnosis present

## 2017-09-09 DIAGNOSIS — R0902 Hypoxemia: Secondary | ICD-10-CM

## 2017-09-09 DIAGNOSIS — R0989 Other specified symptoms and signs involving the circulatory and respiratory systems: Secondary | ICD-10-CM | POA: Insufficient documentation

## 2017-09-09 DIAGNOSIS — F319 Bipolar disorder, unspecified: Secondary | ICD-10-CM | POA: Diagnosis present

## 2017-09-09 DIAGNOSIS — Z23 Encounter for immunization: Secondary | ICD-10-CM | POA: Diagnosis not present

## 2017-09-09 DIAGNOSIS — J9601 Acute respiratory failure with hypoxia: Secondary | ICD-10-CM | POA: Diagnosis present

## 2017-09-09 DIAGNOSIS — Z9114 Patient's other noncompliance with medication regimen: Secondary | ICD-10-CM

## 2017-09-09 DIAGNOSIS — R4182 Altered mental status, unspecified: Secondary | ICD-10-CM | POA: Diagnosis not present

## 2017-09-09 DIAGNOSIS — E876 Hypokalemia: Secondary | ICD-10-CM | POA: Diagnosis present

## 2017-09-09 DIAGNOSIS — R0602 Shortness of breath: Secondary | ICD-10-CM

## 2017-09-09 DIAGNOSIS — J181 Lobar pneumonia, unspecified organism: Secondary | ICD-10-CM | POA: Diagnosis not present

## 2017-09-09 DIAGNOSIS — R109 Unspecified abdominal pain: Secondary | ICD-10-CM | POA: Diagnosis present

## 2017-09-09 DIAGNOSIS — R5383 Other fatigue: Secondary | ICD-10-CM | POA: Insufficient documentation

## 2017-09-09 DIAGNOSIS — F419 Anxiety disorder, unspecified: Secondary | ICD-10-CM | POA: Diagnosis present

## 2017-09-09 DIAGNOSIS — E1165 Type 2 diabetes mellitus with hyperglycemia: Secondary | ICD-10-CM | POA: Diagnosis present

## 2017-09-09 DIAGNOSIS — G9341 Metabolic encephalopathy: Secondary | ICD-10-CM | POA: Diagnosis not present

## 2017-09-09 DIAGNOSIS — Z880 Allergy status to penicillin: Secondary | ICD-10-CM

## 2017-09-09 DIAGNOSIS — R339 Retention of urine, unspecified: Secondary | ICD-10-CM | POA: Insufficient documentation

## 2017-09-09 DIAGNOSIS — E119 Type 2 diabetes mellitus without complications: Secondary | ICD-10-CM | POA: Diagnosis not present

## 2017-09-09 DIAGNOSIS — G47419 Narcolepsy without cataplexy: Secondary | ICD-10-CM | POA: Diagnosis present

## 2017-09-09 DIAGNOSIS — F909 Attention-deficit hyperactivity disorder, unspecified type: Secondary | ICD-10-CM | POA: Diagnosis present

## 2017-09-09 DIAGNOSIS — J440 Chronic obstructive pulmonary disease with acute lower respiratory infection: Secondary | ICD-10-CM | POA: Diagnosis present

## 2017-09-09 DIAGNOSIS — Z9119 Patient's noncompliance with other medical treatment and regimen: Secondary | ICD-10-CM

## 2017-09-09 DIAGNOSIS — Z86718 Personal history of other venous thrombosis and embolism: Secondary | ICD-10-CM

## 2017-09-09 DIAGNOSIS — Z79899 Other long term (current) drug therapy: Secondary | ICD-10-CM

## 2017-09-09 DIAGNOSIS — R270 Ataxia, unspecified: Secondary | ICD-10-CM | POA: Diagnosis not present

## 2017-09-09 DIAGNOSIS — R059 Cough, unspecified: Secondary | ICD-10-CM | POA: Insufficient documentation

## 2017-09-09 DIAGNOSIS — R069 Unspecified abnormalities of breathing: Secondary | ICD-10-CM | POA: Diagnosis not present

## 2017-09-09 DIAGNOSIS — R05 Cough: Secondary | ICD-10-CM | POA: Diagnosis not present

## 2017-09-09 DIAGNOSIS — G4730 Sleep apnea, unspecified: Secondary | ICD-10-CM | POA: Diagnosis present

## 2017-09-09 DIAGNOSIS — E785 Hyperlipidemia, unspecified: Secondary | ICD-10-CM | POA: Diagnosis present

## 2017-09-09 DIAGNOSIS — G4733 Obstructive sleep apnea (adult) (pediatric): Secondary | ICD-10-CM | POA: Diagnosis present

## 2017-09-09 DIAGNOSIS — J441 Chronic obstructive pulmonary disease with (acute) exacerbation: Secondary | ICD-10-CM | POA: Diagnosis not present

## 2017-09-09 DIAGNOSIS — I10 Essential (primary) hypertension: Secondary | ICD-10-CM | POA: Diagnosis present

## 2017-09-09 DIAGNOSIS — F17210 Nicotine dependence, cigarettes, uncomplicated: Secondary | ICD-10-CM | POA: Diagnosis not present

## 2017-09-09 DIAGNOSIS — J45901 Unspecified asthma with (acute) exacerbation: Secondary | ICD-10-CM | POA: Diagnosis not present

## 2017-09-09 DIAGNOSIS — Z8614 Personal history of Methicillin resistant Staphylococcus aureus infection: Secondary | ICD-10-CM

## 2017-09-09 DIAGNOSIS — J90 Pleural effusion, not elsewhere classified: Secondary | ICD-10-CM | POA: Diagnosis not present

## 2017-09-09 HISTORY — DX: Chronic obstructive pulmonary disease, unspecified: J44.9

## 2017-09-09 LAB — URINALYSIS, ROUTINE W REFLEX MICROSCOPIC
Bilirubin Urine: NEGATIVE
GLUCOSE, UA: NEGATIVE mg/dL
HGB URINE DIPSTICK: NEGATIVE
Ketones, ur: NEGATIVE mg/dL
Leukocytes, UA: NEGATIVE
Nitrite: NEGATIVE
PH: 5 (ref 5.0–8.0)
PROTEIN: NEGATIVE mg/dL
Specific Gravity, Urine: 1.015 (ref 1.005–1.030)

## 2017-09-09 LAB — BASIC METABOLIC PANEL
ANION GAP: 7 (ref 5–15)
BUN: 6 mg/dL (ref 6–20)
CALCIUM: 8.3 mg/dL — AB (ref 8.9–10.3)
CHLORIDE: 105 mmol/L (ref 101–111)
CO2: 26 mmol/L (ref 22–32)
CREATININE: 0.88 mg/dL (ref 0.61–1.24)
GFR calc Af Amer: >60 mL/min (ref >60)
GLUCOSE: 154 mg/dL — AB (ref 65–99)
POTASSIUM: 3.3 mmol/L — AB (ref 3.5–5.1)
Sodium: 138 mmol/L (ref 135–145)

## 2017-09-09 LAB — CK: Total CK: 105 U/L (ref 49–397)

## 2017-09-09 LAB — CBC
HCT: 40.3 % (ref 39.0–52.0)
Hemoglobin: 12.7 g/dL — ABNORMAL LOW (ref 13.0–17.0)
MCH: 29.6 pg (ref 26.0–34.0)
MCHC: 31.5 g/dL (ref 30.0–36.0)
MCV: 93.9 fL (ref 78.0–100.0)
Platelets: 333 10*3/uL (ref 150–400)
RBC: 4.29 MIL/uL (ref 4.22–5.81)
RDW: 13.4 % (ref 11.5–15.5)
WBC: 12.7 10*3/uL — ABNORMAL HIGH (ref 4.0–10.5)

## 2017-09-09 LAB — RESPIRATORY PANEL BY PCR
Adenovirus: NOT DETECTED
BORDETELLA PERTUSSIS-RVPCR: NOT DETECTED
CORONAVIRUS HKU1-RVPPCR: NOT DETECTED
CORONAVIRUS OC43-RVPPCR: NOT DETECTED
Chlamydophila pneumoniae: NOT DETECTED
Coronavirus 229E: NOT DETECTED
Coronavirus NL63: NOT DETECTED
Influenza A: NOT DETECTED
Influenza B: NOT DETECTED
METAPNEUMOVIRUS-RVPPCR: NOT DETECTED
Mycoplasma pneumoniae: NOT DETECTED
PARAINFLUENZA VIRUS 1-RVPPCR: NOT DETECTED
PARAINFLUENZA VIRUS 2-RVPPCR: NOT DETECTED
PARAINFLUENZA VIRUS 3-RVPPCR: NOT DETECTED
Parainfluenza Virus 4: NOT DETECTED
RESPIRATORY SYNCYTIAL VIRUS-RVPPCR: NOT DETECTED
RHINOVIRUS / ENTEROVIRUS - RVPPCR: NOT DETECTED

## 2017-09-09 LAB — INFLUENZA PANEL BY PCR (TYPE A & B)
INFLBPCR: NEGATIVE
Influenza A By PCR: NEGATIVE

## 2017-09-09 LAB — BRAIN NATRIURETIC PEPTIDE: B Natriuretic Peptide: 68.4 pg/mL (ref 0.0–100.0)

## 2017-09-09 LAB — I-STAT ARTERIAL BLOOD GAS, ED
Acid-Base Excess: 1 mmol/L (ref 0.0–2.0)
Bicarbonate: 26.5 mmol/L (ref 20.0–28.0)
O2 Saturation: 89 %
Patient temperature: 98.5
TCO2: 28 mmol/L (ref 22–32)
pCO2 arterial: 46.3 mmHg (ref 32.0–48.0)
pH, Arterial: 7.365 (ref 7.350–7.450)
pO2, Arterial: 60 mmHg — ABNORMAL LOW (ref 83.0–108.0)

## 2017-09-09 LAB — MRSA PCR SCREENING: MRSA BY PCR: POSITIVE — AB

## 2017-09-09 LAB — I-STAT TROPONIN, ED: TROPONIN I, POC: 0.01 ng/mL (ref 0.00–0.08)

## 2017-09-09 LAB — STREP PNEUMONIAE URINARY ANTIGEN: STREP PNEUMO URINARY ANTIGEN: NEGATIVE

## 2017-09-09 LAB — PROCALCITONIN: Procalcitonin: <0.1 ng/mL

## 2017-09-09 LAB — I-STAT CG4 LACTIC ACID, ED: Lactic Acid, Venous: 1.15 mmol/L (ref 0.5–1.9)

## 2017-09-09 LAB — SEDIMENTATION RATE: SED RATE: 23 mm/h — AB (ref 0–16)

## 2017-09-09 MED ORDER — ENOXAPARIN SODIUM 80 MG/0.8ML ~~LOC~~ SOLN
65.0000 mg | SUBCUTANEOUS | Status: DC
  Administered 2017-09-09 – 2017-09-13 (×5): 65 mg via SUBCUTANEOUS
  Filled 2017-09-09 (×5): qty 0.8

## 2017-09-09 MED ORDER — AMPHETAMINE-DEXTROAMPHETAMINE 30 MG PO TABS: 75.0000 mg | ORAL_TABLET | Freq: Every day | ORAL | Status: DC

## 2017-09-09 MED ORDER — IPRATROPIUM-ALBUTEROL 0.5-2.5 (3) MG/3ML IN SOLN
3.0000 mL | Freq: Four times a day (QID) | RESPIRATORY_TRACT | Status: DC
  Administered 2017-09-09 – 2017-09-12 (×12): 3 mL via RESPIRATORY_TRACT
  Filled 2017-09-09 (×11): qty 3

## 2017-09-09 MED ORDER — ONDANSETRON HCL 4 MG/2ML IJ SOLN: 4.0000 mg | Freq: Four times a day (QID) | INTRAMUSCULAR | Status: DC | PRN

## 2017-09-09 MED ORDER — METHYLPREDNISOLONE SODIUM SUCC 125 MG IJ SOLR
125.0000 mg | Freq: Once | INTRAMUSCULAR | Status: AC
  Administered 2017-09-09: 125 mg via INTRAVENOUS
  Filled 2017-09-09: qty 2

## 2017-09-09 MED ORDER — INFLUENZA VAC SPLIT QUAD 0.5 ML IM SUSY
0.5000 mL | PREFILLED_SYRINGE | INTRAMUSCULAR | Status: AC
  Administered 2017-09-10: 0.5 mL via INTRAMUSCULAR

## 2017-09-09 MED ORDER — ALBUTEROL SULFATE (2.5 MG/3ML) 0.083% IN NEBU
5.0000 mg | INHALATION_SOLUTION | Freq: Once | RESPIRATORY_TRACT | Status: AC
  Administered 2017-09-09: 5 mg via RESPIRATORY_TRACT
  Filled 2017-09-09: qty 6

## 2017-09-09 MED ORDER — IPRATROPIUM BROMIDE 0.02 % IN SOLN
0.5000 mg | RESPIRATORY_TRACT | Status: AC
  Administered 2017-09-09: 0.5 mg via RESPIRATORY_TRACT
  Filled 2017-09-09: qty 2.5

## 2017-09-09 MED ORDER — MUPIROCIN 2 % EX OINT
1.0000 "application " | TOPICAL_OINTMENT | Freq: Two times a day (BID) | CUTANEOUS | Status: DC
  Administered 2017-09-09 – 2017-09-13 (×9): 1 via NASAL
  Filled 2017-09-09 (×6): qty 22

## 2017-09-09 MED ORDER — ALPRAZOLAM 0.5 MG PO TABS
1.0000 mg | ORAL_TABLET | Freq: Three times a day (TID) | ORAL | Status: DC | PRN
  Administered 2017-09-10 – 2017-09-14 (×11): 1 mg via ORAL
  Filled 2017-09-09 (×11): qty 2

## 2017-09-09 MED ORDER — ALBUTEROL (5 MG/ML) CONTINUOUS INHALATION SOLN
10.0000 mg/h | INHALATION_SOLUTION | RESPIRATORY_TRACT | Status: AC
  Administered 2017-09-09: 10 mg/h via RESPIRATORY_TRACT
  Filled 2017-09-09: qty 20

## 2017-09-09 MED ORDER — SODIUM CHLORIDE 0.9 % IV SOLN
INTRAVENOUS | Status: DC
  Administered 2017-09-09 – 2017-09-10 (×4): via INTRAVENOUS

## 2017-09-09 MED ORDER — ONDANSETRON HCL 4 MG PO TABS: 4.0000 mg | ORAL_TABLET | Freq: Four times a day (QID) | ORAL | Status: DC | PRN

## 2017-09-09 MED ORDER — CHLORHEXIDINE GLUCONATE CLOTH 2 % EX PADS
6.0000 | MEDICATED_PAD | Freq: Every day | CUTANEOUS | Status: AC
  Administered 2017-09-10 – 2017-09-14 (×5): 6 via TOPICAL

## 2017-09-09 MED ORDER — PREGABALIN 100 MG PO CAPS
100.0000 mg | ORAL_CAPSULE | Freq: Two times a day (BID) | ORAL | Status: DC
  Administered 2017-09-09 – 2017-09-14 (×10): 100 mg via ORAL
  Filled 2017-09-09 (×10): qty 1

## 2017-09-09 MED ORDER — AMPHETAMINE-DEXTROAMPHETAMINE 10 MG PO TABS
75.0000 mg | ORAL_TABLET | Freq: Every day | ORAL | Status: DC
  Filled 2017-09-09: qty 8

## 2017-09-09 MED ORDER — PNEUMOCOCCAL VAC POLYVALENT 25 MCG/0.5ML IJ INJ
0.5000 mL | INJECTION | INTRAMUSCULAR | Status: DC
  Filled 2017-09-09: qty 0.5

## 2017-09-09 MED ORDER — LEVOFLOXACIN IN D5W 750 MG/150ML IV SOLN
750.0000 mg | INTRAVENOUS | Status: DC
  Administered 2017-09-10: 750 mg via INTRAVENOUS
  Filled 2017-09-09 (×2): qty 150

## 2017-09-09 MED ORDER — ACETAMINOPHEN 325 MG PO TABS
650.0000 mg | ORAL_TABLET | Freq: Four times a day (QID) | ORAL | Status: DC | PRN
  Administered 2017-09-10 – 2017-09-12 (×5): 650 mg via ORAL
  Filled 2017-09-09 (×5): qty 2

## 2017-09-09 MED ORDER — LEVOFLOXACIN IN D5W 750 MG/150ML IV SOLN
750.0000 mg | Freq: Once | INTRAVENOUS | Status: AC
  Administered 2017-09-09: 750 mg via INTRAVENOUS
  Filled 2017-09-09: qty 150

## 2017-09-09 MED ORDER — SODIUM CHLORIDE 0.9 % IV BOLUS (SEPSIS)
30.0000 mL/kg | Freq: Once | INTRAVENOUS | Status: AC
Start: 2017-09-09 — End: 2017-09-09
  Administered 2017-09-09: 3975 mL via INTRAVENOUS

## 2017-09-09 MED ORDER — ACETAMINOPHEN 650 MG RE SUPP: 650.0000 mg | Freq: Four times a day (QID) | RECTAL | Status: DC | PRN

## 2017-09-09 MED ORDER — NALOXONE HCL 2 MG/2ML IJ SOSY
1.0000 mg | PREFILLED_SYRINGE | Freq: Once | INTRAMUSCULAR | Status: AC
  Administered 2017-09-09: 1 mg via INTRAVENOUS
  Filled 2017-09-09: qty 2

## 2017-09-09 MED ORDER — METHYLPREDNISOLONE SODIUM SUCC 125 MG IJ SOLR: 80.0000 mg | Freq: Once | INTRAMUSCULAR | Status: DC

## 2017-09-09 MED ORDER — AMPHETAMINE-DEXTROAMPHETAMINE 10 MG PO TABS
60.0000 mg | ORAL_TABLET | Freq: Every day | ORAL | Status: DC
  Administered 2017-09-10 – 2017-09-14 (×5): 60 mg via ORAL
  Filled 2017-09-09 (×5): qty 6

## 2017-09-09 NOTE — ED Notes (Signed)
Pt still sitting up in bed sleeping/snoring. Arousable to voice and oriented x 4.

## 2017-09-09 NOTE — ED Triage Notes (Signed)
Pt reports sob since last week, was supposed to see his dr today regarding that. Pt also c/o left flank pain and urinary retention x3 days. Pt a/ox4, resp labored.
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
## 2017-09-09 NOTE — ED Notes (Signed)
Bladder scan performed at 12:22. Max vol. Was found to be 40mL.

## 2017-09-09 NOTE — ED Notes (Signed)
Pt transported to CT ?
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
## 2017-09-09 NOTE — ED Notes (Signed)
Pt. O2 sat was noted to be 90% when we arrived in the room. The O2 at 2L/minute via nasal cannula was applied at 12:11.

## 2017-09-09 NOTE — ED Notes (Signed)
pts family member reports pt had breathing tx pta, will hold until pt sees provider.

## 2017-09-09 NOTE — ED Notes (Signed)
This RN called x-ray and pt is next to go.

## 2017-09-09 NOTE — Progress Notes (Signed)
KOU GUCCIARDO [id number] Admission Data: 09/09/2017 6:24 PM Attending Provider: Waldemar Dickens, MD  WUJ:[id number], Camelia Eng, PA-C Consults/ Treatment Team:   Eric Hayes is a 41 y.o. male patient admitted from ED awake, alert  & orientated  X 3,  Full Code, VSS - Blood pressure 132/62, pulse (!) 102, temperature 98.2 F (36.8 C), temperature source Oral, resp. rate 20, height 6\' 3"  (1.905 m), weight 132.5 kg (292 lb), SpO2 95 %., O2    2 L nasal cannular, no c/o shortness of breath, no c/o chest pain, no distress noted.    IV site WDL:  hand right, condition patent and no redness and left, condition patent and no redness and upper arm left, condition patent and no redness with a transparent dsg that's clean dry and intact.  Allergies:   Allergies  Allergen Reactions  . Penicillins Hives and Swelling    AS A CHILD, has tolerated ceftriaxone and cephalexin in past.      Past Medical History:  Diagnosis Date  . Anxiety   . Back pain, chronic    mid- and lower back  . Bipolar disorder ([place])   . COPD (chronic obstructive pulmonary disease) ([place])   . Depression   . Diet-controlled diabetes mellitus ([place])   . History of DVT (deep vein thrombosis) 07/2010   right leg  . History of MRSA infection 2013   thigh  . Hyperlipidemia    no current med.  . Hypertension    no current med.  . Lung collapse   . Osteoarthritis   . Sleep apnea    uses CPAP nightly    History:  obtained from the patient. Smokes 1 PPD  Pt orientation to unit, room and routine. Information packet given to patient/family and safety video watched.  Admission INP armband ID verified with patient/family, and in place. SR up x 2, fall risk assessment complete with Patient and family verbalizing understanding of risks associated with falls. Pt verbalizes an understanding of how to use the call bell and to call for help before getting out of bed.  Skin, clean-dry- intact without evidence of bruising, or skin tears.    No evidence of skin break down noted on exam. no rashes, no ecchymoses, no petechiae    Will cont to monitor and assist as needed.  Dex Blakely Margaretha Sheffield, RN 09/09/2017 6:24 PM

## 2017-09-09 NOTE — Progress Notes (Signed)
RT to pt's room to obtain ABG but pt unavailable at this time

## 2017-09-09 NOTE — Progress Notes (Signed)
Subjective:  Eric Hayes is a 41 y.o. male who presents accompanied by mother for cough, SOB, urinary c/o.   He notes about a week of cough, congestion, SOB, dyspnea, productive sputum.   He also notes 2-3 days hx/o "kidney pain", not able to urinate like normal.  This morning having trouble getting urine to come out.  Took 30 minutes to urinate this morning.  Had decreased urination yesterday as well.  Only urinated a handful of times yesterday.   Had pink tinged urine this morning.  Denies hx/o kidney stone.   He reports pulse ox typically around 95%.  He notes loss of balance in last 3-4 days.  He reports stopping going to the pain clinic, hasn't seen them since summer time.     mother notes that on the phone in recent days he sounded rough with breathing, slurred speech, and slower to talk and respond to questions compared to usual.  He was slow to ambulate this morning.   I spoke to wife on the phone.   He has been acting similarly for the past several days, had swelling in legs over the weekend.    He has been using 1-2 tablets of wife's pain medication at times including yesterday.   He and she declines that he is using any other pain medication.  He also was apparently started on a new medication by his psychiatrist last week.     He denies chest pain.  He apparently had similar symptoms about a year ago when he was diagnosed with pneumonia.     No other aggravating or relieving factors.  No other c/o.  The following portions of the patient's history were reviewed and updated as appropriate: allergies, current medications, past family history, past medical history, past social history, past surgical history and problem list.  ROS as in subjective  Past Medical History:  Diagnosis Date  . Anxiety   . Back pain, chronic    mid- and lower back  . Bipolar disorder ([place])   . Depression   . Diet-controlled diabetes mellitus ([place])   . History of DVT (deep vein thrombosis) 07/2010   right leg  . History of MRSA infection 2013   thigh  . Hyperlipidemia    no current med.  . Hypertension    no current med.  . Lung collapse   . Osteoarthritis   . Sleep apnea    uses CPAP nightly   Current Outpatient Prescriptions on File Prior to Visit  Medication Sig Dispense Refill  . alprazolam (XANAX) 2 MG tablet Take 1 tablet (2 mg total) by mouth 3 (three) times daily as needed for anxiety. 20 tablet 0  . amphetamine-dextroamphetamine (ADDERALL) 30 MG tablet TAKE 2 AND 1/2 TABLETS BY MOUTH ONCE DAILY IN THE MORNING  0  . levalbuterol (XOPENEX) 0.63 MG/3ML nebulizer solution USE 1 VIAL(3 ML) IN NEBULIZER EVERY 6 HOURS AS NEEDED FOR WHEEZING OR SHORTNESS OF BREATH 1125 mL 1  . LYRICA 150 MG capsule TAKE ONE CAPSULE BY MOUTH TWICE DAILY  2  . pravastatin (PRAVACHOL) 20 MG tablet Take 1 tablet (20 mg total) by mouth daily. (Patient not taking: Reported on 09/09/2017) 90 tablet 3   No current facility-administered medications on file prior to visit.    ROS as in subjective   Objective: BP 118/76   Pulse 86   SpO2 91%   General appearance: WD/WN, lethargic, shallower than normal breathing  Skin: warm, no rash                           Head: no sinus tenderness                          Nose: septum midline, turbinates swollen, with erythema and clear discharge             Mouth/throat: MMM, tongue normal,no obvious pharyngeal erythema                           Neck: supple, no adenopathy, no thyromegaly, nontender                          Heart: RRR, normal S1, S2, no murmurs                         Lungs: +bronchial breath sounds, +scattered rhonchi, +wheezes, somewhat shallow breathing                Extremities: 1+ bilat pitting edema, nontender    neuro: he was slow to respond to questioning although he did answer questions appropriately but slow, however, he had to be reminded to keep the nebulizer mouthpiece in his mouth.    Psych: blunted  affect     Assessment: Encounter Diagnoses  Name Primary?  . SOB (shortness of breath) Yes  . Cough   . Altered mental status, unspecified altered mental status type   . Fatigue, unspecified type   . Urinary retention   . Hypoxemia   . Shallow breathing   . Lethargic   . Ataxia      Plan:  Gave 1 round of albuterol nebulized therapy after listing to lungs while getting additional history.    Given his lethargy, dyspnea, wheezing, hypoxemia, and overall clinical appearance, we decided to have him be evaluated in the ED.  He declined EMS transport.   I walked him out to his mother's vehicle and mother will take him now to [facility] ED.  I called  triage in regards to his case.  Consider differential including CHF, pneumonia, COPD exacerbation, hypoxemia, CVA, drug overdose, adverse medication interaction, kidney injury, urinary retention  Eric Hayes was seen today for copd.  Diagnoses and all orders for this visit:  SOB (shortness of breath)  Cough  Altered mental status, unspecified altered mental status type  Fatigue, unspecified type  Urinary retention  Hypoxemia  Shallow breathing  Lethargic  Ataxia

## 2017-09-09 NOTE — ED Notes (Signed)
RT in room to draw blood gas

## 2017-09-09 NOTE — ED Notes (Signed)
2 sets of blood cultures drawn by this RN and held if needed prior to starting antibiotics.

## 2017-09-10 ENCOUNTER — Observation Stay (HOSPITAL_COMMUNITY): Payer: Medicare Other

## 2017-09-10 DIAGNOSIS — I10 Essential (primary) hypertension: Secondary | ICD-10-CM | POA: Diagnosis present

## 2017-09-10 DIAGNOSIS — F17210 Nicotine dependence, cigarettes, uncomplicated: Secondary | ICD-10-CM | POA: Diagnosis present

## 2017-09-10 DIAGNOSIS — J180 Bronchopneumonia, unspecified organism: Secondary | ICD-10-CM | POA: Diagnosis present

## 2017-09-10 DIAGNOSIS — E876 Hypokalemia: Secondary | ICD-10-CM | POA: Diagnosis present

## 2017-09-10 DIAGNOSIS — R109 Unspecified abdominal pain: Secondary | ICD-10-CM | POA: Diagnosis present

## 2017-09-10 DIAGNOSIS — J441 Chronic obstructive pulmonary disease with (acute) exacerbation: Secondary | ICD-10-CM | POA: Diagnosis not present

## 2017-09-10 DIAGNOSIS — F909 Attention-deficit hyperactivity disorder, unspecified type: Secondary | ICD-10-CM | POA: Diagnosis present

## 2017-09-10 DIAGNOSIS — J440 Chronic obstructive pulmonary disease with acute lower respiratory infection: Secondary | ICD-10-CM | POA: Diagnosis present

## 2017-09-10 DIAGNOSIS — E785 Hyperlipidemia, unspecified: Secondary | ICD-10-CM | POA: Diagnosis present

## 2017-09-10 DIAGNOSIS — G47419 Narcolepsy without cataplexy: Secondary | ICD-10-CM | POA: Diagnosis present

## 2017-09-10 DIAGNOSIS — Z9114 Patient's other noncompliance with medication regimen: Secondary | ICD-10-CM | POA: Diagnosis not present

## 2017-09-10 DIAGNOSIS — R490 Dysphonia: Secondary | ICD-10-CM | POA: Diagnosis present

## 2017-09-10 DIAGNOSIS — Z9119 Patient's noncompliance with other medical treatment and regimen: Secondary | ICD-10-CM | POA: Diagnosis not present

## 2017-09-10 DIAGNOSIS — G9341 Metabolic encephalopathy: Secondary | ICD-10-CM | POA: Diagnosis present

## 2017-09-10 DIAGNOSIS — E1165 Type 2 diabetes mellitus with hyperglycemia: Secondary | ICD-10-CM | POA: Diagnosis present

## 2017-09-10 DIAGNOSIS — J45901 Unspecified asthma with (acute) exacerbation: Secondary | ICD-10-CM | POA: Diagnosis not present

## 2017-09-10 DIAGNOSIS — Z8614 Personal history of Methicillin resistant Staphylococcus aureus infection: Secondary | ICD-10-CM | POA: Diagnosis not present

## 2017-09-10 DIAGNOSIS — R0602 Shortness of breath: Secondary | ICD-10-CM | POA: Diagnosis not present

## 2017-09-10 DIAGNOSIS — F419 Anxiety disorder, unspecified: Secondary | ICD-10-CM | POA: Diagnosis present

## 2017-09-10 DIAGNOSIS — E860 Dehydration: Secondary | ICD-10-CM | POA: Diagnosis present

## 2017-09-10 DIAGNOSIS — J9601 Acute respiratory failure with hypoxia: Secondary | ICD-10-CM | POA: Diagnosis present

## 2017-09-10 DIAGNOSIS — G4733 Obstructive sleep apnea (adult) (pediatric): Secondary | ICD-10-CM | POA: Diagnosis present

## 2017-09-10 DIAGNOSIS — Z86718 Personal history of other venous thrombosis and embolism: Secondary | ICD-10-CM | POA: Diagnosis not present

## 2017-09-10 DIAGNOSIS — F319 Bipolar disorder, unspecified: Secondary | ICD-10-CM | POA: Diagnosis present

## 2017-09-10 DIAGNOSIS — Z23 Encounter for immunization: Secondary | ICD-10-CM | POA: Diagnosis not present

## 2017-09-10 LAB — CBC
HCT: 40.2 % (ref 39.0–52.0)
Hemoglobin: 12.8 g/dL — ABNORMAL LOW (ref 13.0–17.0)
MCH: 29.4 pg (ref 26.0–34.0)
MCHC: 31.8 g/dL (ref 30.0–36.0)
MCV: 92.2 fL (ref 78.0–100.0)
PLATELETS: 348 10*3/uL (ref 150–400)
RBC: 4.36 MIL/uL (ref 4.22–5.81)
RDW: 13.2 % (ref 11.5–15.5)
WBC: 21.1 10*3/uL — ABNORMAL HIGH (ref 4.0–10.5)

## 2017-09-10 LAB — HIV ANTIBODY (ROUTINE TESTING W REFLEX): HIV Screen 4th Generation wRfx: NONREACTIVE

## 2017-09-10 LAB — URINE CULTURE: CULTURE: NO GROWTH

## 2017-09-10 LAB — COMPREHENSIVE METABOLIC PANEL
ALBUMIN: 2.8 g/dL — AB (ref 3.5–5.0)
ALT: 16 U/L — AB (ref 17–63)
AST: 16 U/L (ref 15–41)
Alkaline Phosphatase: 84 U/L (ref 38–126)
Anion gap: 9 (ref 5–15)
BILIRUBIN TOTAL: 0.5 mg/dL (ref 0.3–1.2)
BUN: 6 mg/dL (ref 6–20)
CALCIUM: 8.7 mg/dL — AB (ref 8.9–10.3)
CO2: 24 mmol/L (ref 22–32)
CREATININE: 0.72 mg/dL (ref 0.61–1.24)
Chloride: 105 mmol/L (ref 101–111)
GFR calc non Af Amer: >60 mL/min (ref >60)
GLUCOSE: 171 mg/dL — AB (ref 65–99)
Potassium: 3.7 mmol/L (ref 3.5–5.1)
SODIUM: 138 mmol/L (ref 135–145)
TOTAL PROTEIN: 6.1 g/dL — AB (ref 6.5–8.1)

## 2017-09-10 MED ORDER — BUDESONIDE 0.5 MG/2ML IN SUSP
0.5000 mg | Freq: Two times a day (BID) | RESPIRATORY_TRACT | Status: DC
  Administered 2017-09-10 – 2017-09-14 (×8): 0.5 mg via RESPIRATORY_TRACT
  Filled 2017-09-10 (×8): qty 2

## 2017-09-10 MED ORDER — METHYLPREDNISOLONE SODIUM SUCC 125 MG IJ SOLR
60.0000 mg | Freq: Four times a day (QID) | INTRAMUSCULAR | Status: DC
  Administered 2017-09-10 – 2017-09-12 (×7): 60 mg via INTRAVENOUS
  Filled 2017-09-10 (×7): qty 2

## 2017-09-10 MED ORDER — ARFORMOTEROL TARTRATE 15 MCG/2ML IN NEBU
15.0000 ug | INHALATION_SOLUTION | Freq: Two times a day (BID) | RESPIRATORY_TRACT | Status: DC
  Administered 2017-09-10 – 2017-09-14 (×8): 15 ug via RESPIRATORY_TRACT
  Filled 2017-09-10 (×8): qty 2

## 2017-09-10 NOTE — Progress Notes (Addendum)
PROGRESS NOTE [facility]   Eric Hayes   DXA:[id number] DOB: 10-Jun-1976  DOA: 09/09/2017 PCP: Carlena Hurl, PA-C   Brief Narrative:  Eric Hayes 41 year old male with medical history significant for COPD, ongoing tobacco abuse, bipolar, hypertension and sleep apnea presented to the emergency department complaining of back pain, cough and difficulty with breathing. Upon ED evaluation he was found to have elevated white count at 12.7. With mild hypoxia, because of his back pain CT renal stone was obtained which showed patchy nodular ground glass densities in the left lower lobe concerning of infectious process. Patient was admitted for working diagnosis of pneumonia and he was started on IV Levaquin.  Subjective: A shunt seen and examined, he is reporting feeling significantly better although continues with cough and some shortness of breath. Patient is in 3L nasal cannula. Continues to have back pain.  Assessment & Plan: Bronchopneumonia COPD exacerbation/Acute respiratory failure with hypoxia  CT of the chest reveals patchy bilateral bronchopneumonia prominent on the left lower lobe Continue Levaquin IV for now Will add Solu-Medrol, Brovana, Pulmicort and continue nebulizers. Wean O2 as tolerated Blood cultures thus far negative - Will follow Negative respiratory panel Pro-calcitonin negative but with evidence of pneumonia and CT scan and elevated white count we'll continue to treat with antibiotics. WBC increase ? If due to initial steroid dose vs infectious, patient clinically and afebrile, will monitor. IF spike fever or deteriorates will consider broaden abx coverage.   COPD exacerbation Due to infection, treat as above  Acute metabolic encephalopathy Likely secondary to infectious etiology. Patient seems to be at baseline.  OSA Continue CPAP  Hypertension Blood pressure stable Continue current management  DM type II - diet controlled  Monitor   DVT  prophylaxis: Lovenox  Code Status: Full  Family Communication: None at bedside  Disposition Plan: Home in 2-3 days   Consultants:   None   Procedures:   None  Antimicrobials:  Levaquin 09/09/17    Objective: Vitals:   09/10/17 0537 09/10/17 0833 09/10/17 1445 09/10/17 1532  BP: 125/73   (!) 126/58  Pulse: 87 89 (!) 117 (!) 114  Resp: 19 18 20 19   Temp: (!) 97.4 F (36.3 C)   98.1 F (36.7 C)  TempSrc: Oral   Oral  SpO2: 99% 97% 96% 91%  Weight:      Height:        Intake/Output Summary (Last 24 hours) at 09/10/17 1550 Last data filed at 09/10/17 1309  Gross per 24 hour  Intake          1843.34 ml  Output             1400 ml  Net           443.34 ml   Filed Weights   09/09/17 1220  Weight: 132.5 kg (292 lb)    Examination:  General exam: NAD  HEENT: PERRLA, OP moist and clear Respiratory system: decreased BS bilaterally, diffuse wheezing. Coarse breath sound at the left lower lobe Cardiovascular system: S1 & S2 heard, RRR. No JVD, murmurs, rubs or gallops Gastrointestinal system: Abdomen is nondistended, soft and nontender. Normal bowel sounds heard. Central nervous system: Alert and oriented. No focal neurological deficits. Extremities: No pedal edema.  Skin: No rashes, lesions or ulcers Psychiatry: Mood & affect appropriate.    Data Reviewed: I have personally reviewed following labs and imaging studies  CBC:  Recent Labs Lab 09/09/17 1149 09/10/17 0612  WBC 12.7* 21.1*  HGB  12.7* 12.8*  HCT 40.3 40.2  MCV 93.9 92.2  PLT 333 710   Basic Metabolic Panel:  Recent Labs Lab 09/09/17 1149 09/10/17 0612  NA 138 138  K 3.3* 3.7  CL 105 105  CO2 26 24  GLUCOSE 154* 171*  BUN 6 6  CREATININE 0.88 0.72  CALCIUM 8.3* 8.7*   GFR: Estimated Creatinine Clearance: 178.2 mL/min (by C-G formula based on SCr of 0.72 mg/dL). Liver Function Tests:  Recent Labs Lab 09/10/17 0612  AST 16  ALT 16*  ALKPHOS 84  BILITOT 0.5  PROT 6.1*    ALBUMIN 2.8*   No results for input(s): LIPASE, AMYLASE in the last 168 hours. No results for input(s): AMMONIA in the last 168 hours. Coagulation Profile: No results for input(s): INR, PROTIME in the last 168 hours. Cardiac Enzymes:  Recent Labs Lab 09/09/17 1703  CKTOTAL 105   BNP (last 3 results) No results for input(s): PROBNP in the last 8760 hours. HbA1C: No results for input(s): HGBA1C in the last 72 hours. CBG: No results for input(s): GLUCAP in the last 168 hours. Lipid Profile: No results for input(s): CHOL, HDL, LDLCALC, TRIG, CHOLHDL, LDLDIRECT in the last 72 hours. Thyroid Function Tests: No results for input(s): TSH, T4TOTAL, FREET4, T3FREE, THYROIDAB in the last 72 hours. Anemia Panel: No results for input(s): VITAMINB12, FOLATE, FERRITIN, TIBC, IRON, RETICCTPCT in the last 72 hours. Sepsis Labs:  Recent Labs Lab 09/09/17 1149 09/09/17 1447  PROCALCITON <0.10  --   LATICACIDVEN  --  1.15    Recent Results (from the past 240 hour(s))  Culture, blood (routine x 2) Call MD if unable to obtain prior to antibiotics being given     Status: None (Preliminary result)   Collection Time: 09/09/17 12:30 PM  Result Value Ref Range Status   Specimen Description BLOOD LEFT ARM  Final   Special Requests   Final    BOTTLES DRAWN AEROBIC AND ANAEROBIC Blood Culture adequate volume   Culture NO GROWTH < 24 HOURS  Final   Report Status PENDING  Incomplete  Culture, blood (routine x 2) Call MD if unable to obtain prior to antibiotics being given     Status: None (Preliminary result)   Collection Time: 09/09/17  3:05 PM  Result Value Ref Range Status   Specimen Description BLOOD LEFT HAND  Final   Special Requests   Final    BOTTLES DRAWN AEROBIC AND ANAEROBIC Blood Culture adequate volume   Culture NO GROWTH < 24 HOURS  Final   Report Status PENDING  Incomplete  Respiratory Panel by PCR     Status: None   Collection Time: 09/09/17  3:33 PM  Result Value Ref Range  Status   Adenovirus NOT DETECTED NOT DETECTED Final   Coronavirus 229E NOT DETECTED NOT DETECTED Final   Coronavirus HKU1 NOT DETECTED NOT DETECTED Final   Coronavirus NL63 NOT DETECTED NOT DETECTED Final   Coronavirus OC43 NOT DETECTED NOT DETECTED Final   Metapneumovirus NOT DETECTED NOT DETECTED Final   Rhinovirus / Enterovirus NOT DETECTED NOT DETECTED Final   Influenza A NOT DETECTED NOT DETECTED Final   Influenza B NOT DETECTED NOT DETECTED Final   Parainfluenza Virus 1 NOT DETECTED NOT DETECTED Final   Parainfluenza Virus 2 NOT DETECTED NOT DETECTED Final   Parainfluenza Virus 3 NOT DETECTED NOT DETECTED Final   Parainfluenza Virus 4 NOT DETECTED NOT DETECTED Final   Respiratory Syncytial Virus NOT DETECTED NOT DETECTED Final   Bordetella pertussis  NOT DETECTED NOT DETECTED Final   Chlamydophila pneumoniae NOT DETECTED NOT DETECTED Final   Mycoplasma pneumoniae NOT DETECTED NOT DETECTED Final  Culture, Urine     Status: None   Collection Time: 09/09/17  4:05 PM  Result Value Ref Range Status   Specimen Description URINE, CLEAN CATCH  Final   Special Requests NONE  Final   Culture NO GROWTH  Final   Report Status 09/10/2017 FINAL  Final  Culture, respiratory (NON-Expectorated)     Status: None (Preliminary result)   Collection Time: 09/09/17  4:06 PM  Result Value Ref Range Status   Specimen Description SPUTUM  Final   Special Requests NONE  Final   Gram Stain   Final    MODERATE WBC PRESENT, PREDOMINANTLY PMN FEW SQUAMOUS EPITHELIAL CELLS PRESENT MODERATE GRAM POSITIVE COCCI FEW GRAM POSITIVE RODS FEW GRAM NEGATIVE RODS    Culture CULTURE REINCUBATED FOR BETTER GROWTH  Final   Report Status PENDING  Incomplete  MRSA PCR Screening     Status: Abnormal   Collection Time: 09/09/17  5:07 PM  Result Value Ref Range Status   MRSA by PCR POSITIVE (A) NEGATIVE Final    Comment:        The GeneXpert MRSA Assay (FDA approved for NASAL specimens only), is one component of  a comprehensive MRSA colonization surveillance program. It is not intended to diagnose MRSA infection nor to guide or monitor treatment for MRSA infections. RESULT CALLED TO, READ BACK BY AND VERIFIED WITH: K COBLE RN 2055 09/09/17 A BROWNING       Radiology Studies: Dg Chest 2 View  Result Date: 09/09/2017 CLINICAL DATA:  LEFT flank pain Urinary retention EXAM: CHEST  2 VIEW COMPARISON:  [id number] FINDINGS: Normal cardiac silhouette. Interval improvement in interstitial edema pattern. Small RIGHT effusion noted. No consolidation. No pneumothorax. IMPRESSION: Improvement interstitial edema.  Persistent small RIGHT effusion Electronically Signed   By: Suzy Bouchard M.D.   On: 09/09/2017 15:06   Ct Chest Wo Contrast  Result Date: 09/10/2017 CLINICAL DATA:  Shortness of breath and suspected upper respiratory infection. History of prior pneumonia and empyema. EXAM: CT CHEST WITHOUT CONTRAST TECHNIQUE: Multidetector CT imaging of the chest was performed following the standard protocol without IV contrast. COMPARISON:  Chest x-ray 09/09/2017.  Chest CT 10/28/2016 FINDINGS: Cardiovascular: The heart is normal in size. No pericardial effusion. The aorta is normal in caliber. Minimal scattered atherosclerotic calcifications at the aortic arch. Mediastinum/Nodes: Borderline enlarged mediastinal and left hilar lymph nodes likely inflammatory/ hyperplastic. Right paraesophageal lymph node on image number 91 measures 11.5 mm. Aorticopulmonary window lymph node on image number 92 measures 11 mm. 11 mm subcarinal lymph node on image number 117. Lungs/Pleura: Patchy subsegmental atelectasis or clearing infiltrate noted in the right lower lobe the and also minimally in the right middle lobe. Airspace opacity in the left lower lobe is also noted and likely infiltrate. I do not see a discrete mass or worrisome pulmonary nodules. No pleural effusion. Mild nodularity at the left lung base is likely airspace disease. No  pleural effusion. Upper Abdomen: Mild eventration of the right hemidiaphragm. No significant upper abdominal findings. Musculoskeletal: No chest wall mass, supraclavicular or axillary lymphadenopathy. The thyroid gland appears normal. No significant bony findings. IMPRESSION: 1. The CT findings most consistent with patchy bilateral bronchopneumonia, most significant in the left lower lobe. There are also inflamed/hyperplastic mediastinal and left hilar lymph nodes. 2. No discrete mass or worrisome pulmonary lesions. 3. Would recommend a followup post  treatment chest x-ray to make sure these findings resolve. Aortic Atherosclerosis (ICD10-I70.0). Electronically Signed   By: Marijo Sanes M.D.   On: 09/10/2017 10:27   Ct Renal Stone Study  Result Date: 09/09/2017 CLINICAL DATA:  Initial evaluation for acute left-sided flank pain, urinary retention. EXAM: CT ABDOMEN AND PELVIS WITHOUT CONTRAST TECHNIQUE: Multidetector CT imaging of the abdomen and pelvis was performed following the standard protocol without IV contrast. COMPARISON:  Prior CT from 08/01/2016. FINDINGS: Lower chest: Patchy in nodular ground-glass densities partially visualized within the left lower lobe, nonspecific, but suspicious for possible infection. Superimposed bibasilar atelectasis and/ or scarring. Visualized lung bases otherwise clear. Hepatobiliary: Limited noncontrast evaluation of the liver is unremarkable. Gallbladder within normal limits. No biliary dilatation. Pancreas: Pancreas within normal limits. Spleen: Spleen within normal limits. Adrenals/Urinary Tract: Adrenal glands are normal. Kidneys equal in size without evidence for nephrolithiasis or hydronephrosis. No radiopaque calculi seen along the course of either renal collecting system. No hydroureter. Bladder within normal limits. No layering stones within the bladder lumen. Stomach/Bowel: Stomach within normal limits. No evidence for bowel obstruction. No acute inflammatory  changes seen about the bowels. Appendix not visualize, consistent with history prior appendectomy. Vascular/Lymphatic: Intra-abdominal aorta of normal caliber. No adenopathy. Reproductive: Prostate normal. Other: No free air or fluid. Musculoskeletal: No acute osseus abnormality. No worrisome lytic or blastic osseous lesions. Sclerotic focus within the right iliac wing adjacent to the right SI joint most likely reflects a benign bone island. IMPRESSION: 1. No CT evidence for nephrolithiasis or obstructive uropathy. 2. Patchy and nodular ground-glass densities within the partially visualized left lower lobe, nonspecific, but could reflect the sequelae of infection/pneumonia in the correct clinical setting. 3. No other acute intra-abdominal or pelvic process. Electronically Signed   By: Jeannine Boga M.D.   On: 09/09/2017 14:07      Scheduled Meds: . amphetamine-dextroamphetamine  60 mg Oral Q breakfast  . Chlorhexidine Gluconate Cloth  6 each Topical Q0600  . enoxaparin (LOVENOX) injection  65 mg Subcutaneous Q24H  . ipratropium-albuterol  3 mL Nebulization Q6H  . mupirocin ointment  1 application Nasal BID  . pneumococcal 23 valent vaccine  0.5 mL Intramuscular Tomorrow-1000  . pregabalin  100 mg Oral BID   Continuous Infusions: . sodium chloride 100 mL/hr at 09/10/17 0927  . levofloxacin (LEVAQUIN) IV       LOS: 0 days    Time spent: Total of 25 minutes spent with pt, greater than 50% of which was spent in discussion of  treatment, counseling and coordination of care    Chipper Oman, MD Pager: Text Page via [email or web address]   If 7PM-7AM, please contact night-coverage [email or web address] 09/10/2017, 3:50 PM

## 2017-09-10 NOTE — Care Management Obs Status (Signed)
[place] NOTIFICATION   Patient Details  Name: CAMILLO QUADROS MRN: [id number] Date of Birth: 12/20/1975   Medicare Observation Status Notification Given:  Yes    Carles Collet, RN 09/10/2017, 2:51 PM

## 2017-09-11 LAB — BASIC METABOLIC PANEL
ANION GAP: 9 (ref 5–15)
BUN: 9 mg/dL (ref 6–20)
CO2: 25 mmol/L (ref 22–32)
Calcium: 8.7 mg/dL — ABNORMAL LOW (ref 8.9–10.3)
Chloride: 105 mmol/L (ref 101–111)
Creatinine, Ser: 0.63 mg/dL (ref 0.61–1.24)
GFR calc Af Amer: >60 mL/min (ref >60)
Glucose, Bld: 153 mg/dL — ABNORMAL HIGH (ref 65–99)
POTASSIUM: 4.1 mmol/L (ref 3.5–5.1)
SODIUM: 139 mmol/L (ref 135–145)

## 2017-09-11 LAB — CBC
HEMATOCRIT: 40.1 % (ref 39.0–52.0)
HEMOGLOBIN: 12.9 g/dL — AB (ref 13.0–17.0)
MCH: 29.7 pg (ref 26.0–34.0)
MCHC: 32.2 g/dL (ref 30.0–36.0)
MCV: 92.2 fL (ref 78.0–100.0)
Platelets: 383 10*3/uL (ref 150–400)
RBC: 4.35 MIL/uL (ref 4.22–5.81)
RDW: 13.4 % (ref 11.5–15.5)
WBC: 20.3 10*3/uL — AB (ref 4.0–10.5)

## 2017-09-11 LAB — CULTURE, RESPIRATORY

## 2017-09-11 LAB — MAGNESIUM: Magnesium: 2 mg/dL (ref 1.7–2.4)

## 2017-09-11 LAB — CULTURE, RESPIRATORY W GRAM STAIN

## 2017-09-11 MED ORDER — NICOTINE 14 MG/24HR TD PT24
14.0000 mg | MEDICATED_PATCH | Freq: Every day | TRANSDERMAL | Status: DC
  Administered 2017-09-11 – 2017-09-14 (×4): 14 mg via TRANSDERMAL
  Filled 2017-09-11 (×4): qty 1

## 2017-09-11 MED ORDER — LEVOFLOXACIN 750 MG PO TABS: 750.0000 mg | ORAL_TABLET | Freq: Every day | ORAL | Status: DC

## 2017-09-11 MED ORDER — LEVOFLOXACIN 750 MG PO TABS
750.0000 mg | ORAL_TABLET | Freq: Every day | ORAL | Status: AC
  Administered 2017-09-11 – 2017-09-13 (×3): 750 mg via ORAL
  Filled 2017-09-11 (×3): qty 1

## 2017-09-11 NOTE — Progress Notes (Signed)
[facility] Progress Note  Patient: Eric Hayes [id number]   PCP: Carlena Hurl, PA-C DOB: 03-Oct-1976   DOA: 09/09/2017   DOS: 09/11/2017   Date of Service: the patient was seen and examined on 09/11/2017  Subjective: Feeling better as compared to yesterday but still appears in respiratory distress, has cough as well as shortness of breath. No chest pain no abdominal pain. No nausea no vomiting. No leg swelling or leg pain. On 3 L of oxygen.  Brief hospital course: Pt. 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 Currently further plan is continue antibiotics and IV steroids.  Assessment and Plan: 1. Left lobar pneumonia. Acute respiratory failure with hypoxia. COPD exacerbation with bronchospasm. Started on IV Levaquin since admission, blood cultures negative to date. Sputum culture multiple organisms, MRSA PCR positive, strep pneumo antigen negative. Solu-Medrol added on 09/10/2017, will continue high-dose steroids. Continue inhalers as well as nebulizer and Pulmicort. Respiratory virus panel influenza PCR negative as well. Pro-calcitonin negative, mild elevation of ESR. Patient has history of early organizing pneumonia, if his respiratory distress and hypoxia does not improve patient will be consulted with pulmonary for further input. Regardless we'll refer the patient to pulmonary as an outpatient. Add incentive spirometry and flutter device.  2. Acute encephalopathy. Possibly metabolic possible due to hypoxia. Getting better seems to be at baseline. Resolved.  3. Obstructive sleep apnea. Continue C Pap.  4. Type 2 diabetes mellitus. Primarily diet controlled at home. Hemoglobin A1c 5.2. I would recheck tomorrow  On  sliding scale here in the hospital and sugars are actually well controlled.  5. Left flank pain. Primary presented with fever chills and left flank pain. Has been on IV antibiotics since admission. UA unremarkable. CT renal stone study negative for any intra-abdominal abnormality. No other acute intra-abdominal abnormality as well. from left lower lobe pneumonia, continue current pain control.  6. Mild hypokalemia. Currently resolved.  Diet: Carb modified diet DVT Prophylaxis: subcutaneous Heparin  Advance goals of care discussion: full code  Family Communication: no family was present at bedside, at the time of interview.  Disposition:  Discharge to home.  Consultants: none Procedures: none  Antibiotics: Anti-infectives    Start     Dose/Rate Route Frequency Ordered Stop   09/11/17 1500  levofloxacin (LEVAQUIN) tablet 750 mg  Status:  Discontinued     750 mg Oral Daily 09/11/17 1057 09/11/17 1058   09/11/17 1500  levofloxacin (LEVAQUIN) tablet 750 mg     750 mg Oral Daily 09/11/17 1058 09/14/17 1459   09/10/17 1500  levofloxacin (LEVAQUIN) IVPB 750 mg  Status:  Discontinued     750 mg 100 mL/hr over 90 Minutes Intravenous Every 24 hours 09/09/17 1539 09/11/17 1057   09/09/17 1430  levofloxacin (LEVAQUIN) IVPB 750 mg     750 mg 100 mL/hr over 90 Minutes Intravenous  Once 09/09/17 1428 09/09/17 1638       Objective: Physical Exam: Vitals:   09/11/17 0505 09/11/17 0851 09/11/17 1323 09/11/17 1419  BP: 92/62  (!) 149/78   Pulse: (!) 102 100 (!) 118 (!) 115  Resp: '18 18 20 20  ' Temp: 97.8 F (36.6 C)  97.8 F (36.6 C)   TempSrc: Oral  Oral   SpO2: 94%  95% 94% 96%  Weight:      Height:        Intake/Output Summary (Last 24 hours) at 09/11/17 1514 Last data filed at 09/11/17 1324  Gross per 24 hour  Intake              372 ml  Output             2250 ml  Net            -1878 ml   Filed Weights   09/09/17 1220  Weight: 132.5 kg (292 lb)   General: Alert,  Awake and Oriented to Time, Place and Person. Appear in moderate distress, affect appropriate Eyes: PERRL, Conjunctiva normal ENT: Oral Mucosa clear moist. Neck: no JVD, no Abnormal Mass Or lumps Cardiovascular: S1 and S2 Present, no Murmur, Peripheral Pulses Present Respiratory: increaed respiratory effort, Bilateral Air entry equal and Decreased, no use of accessory muscle, lef basal Crackles, bilateral expiratory  wheezes Abdomen: Bowel Sound present, Soft and no tenderness, no hernia Skin: no redness, no Rash, no induration Extremities: no Pedal edema, no calf tenderness Neurologic: Grossly no focal neuro deficit. Bilaterally Equal motor strength  Data Reviewed: CBC:  Recent Labs Lab 09/09/17 1149 09/10/17 0612 09/11/17 0425  WBC 12.7* 21.1* 20.3*  HGB 12.7* 12.8* 12.9*  HCT 40.3 40.2 40.1  MCV 93.9 92.2 92.2  PLT 333 348 317   Basic Metabolic Panel:  Recent Labs Lab 09/09/17 1149 09/10/17 0612 09/11/17 0425  NA 138 138 139  K 3.3* 3.7 4.1  CL 105 105 105  CO2 '26 24 25  ' GLUCOSE 154* 171* 153*  BUN '6 6 9  ' CREATININE 0.88 0.72 0.63  CALCIUM 8.3* 8.7* 8.7*  MG  --   --  2.0    Liver Function Tests:  Recent Labs Lab 09/10/17 0612  AST 16  ALT 16*  ALKPHOS 84  BILITOT 0.5  PROT 6.1*  ALBUMIN 2.8*   No results for input(s): LIPASE, AMYLASE in the last 168 hours. No results for input(s): AMMONIA in the last 168 hours. Coagulation Profile: No results for input(s): INR, PROTIME in the last 168 hours. Cardiac Enzymes:  Recent Labs Lab 09/09/17 1703  CKTOTAL 105   BNP (last 3 results) No results for input(s): PROBNP in the last 8760 hours. CBG: No results for input(s): GLUCAP in the last 168 hours. Studies: No results found.  Scheduled Meds: . amphetamine-dextroamphetamine  60 mg Oral Q breakfast  . arformoterol  15 mcg Nebulization BID  . budesonide (PULMICORT) nebulizer solution  0.5 mg Nebulization BID  . Chlorhexidine Gluconate Cloth  6 each  Topical Q0600  . enoxaparin (LOVENOX) injection  65 mg Subcutaneous Q24H  . ipratropium-albuterol  3 mL Nebulization Q6H  . levofloxacin  750 mg Oral Daily  . methylPREDNISolone (SOLU-MEDROL) injection  60 mg Intravenous Q6H  . mupirocin ointment  1 application Nasal BID  . nicotine  14 mg Transdermal Daily  . pneumococcal 23 valent vaccine  0.5 mL Intramuscular Tomorrow-1000  . pregabalin  100 mg Oral BID   Continuous Infusions: PRN Meds: acetaminophen **OR** acetaminophen, alprazolam, ondansetron **OR** ondansetron (ZOFRAN) IV  Time spent: 35 minutes  Author: Berle Mull, MD [facility] Pager: [phone] 09/11/2017 3:14 PM  If 7PM-7AM, please contact night-coverage at [email or web address], password [facility]

## 2017-09-11 NOTE — Progress Notes (Signed)
Patient refusing CPAP tonight. Patient is on 3LNC with 02 saturations at 95%. Unit in room. Will continue to monitor.

## 2017-09-12 LAB — BASIC METABOLIC PANEL
Anion gap: 9 (ref 5–15)
BUN: 11 mg/dL (ref 6–20)
CALCIUM: 8.7 mg/dL — AB (ref 8.9–10.3)
CO2: 26 mmol/L (ref 22–32)
CREATININE: 0.7 mg/dL (ref 0.61–1.24)
Chloride: 104 mmol/L (ref 101–111)
GFR calc Af Amer: >60 mL/min (ref >60)
GFR calc non Af Amer: >60 mL/min (ref >60)
Glucose, Bld: 182 mg/dL — ABNORMAL HIGH (ref 65–99)
Potassium: 4.2 mmol/L (ref 3.5–5.1)
SODIUM: 139 mmol/L (ref 135–145)

## 2017-09-12 LAB — CBC
HEMATOCRIT: 40.9 % (ref 39.0–52.0)
HEMOGLOBIN: 12.8 g/dL — AB (ref 13.0–17.0)
MCH: 28.9 pg (ref 26.0–34.0)
MCHC: 31.3 g/dL (ref 30.0–36.0)
MCV: 92.3 fL (ref 78.0–100.0)
Platelets: 385 10*3/uL (ref 150–400)
RBC: 4.43 MIL/uL (ref 4.22–5.81)
RDW: 13.6 % (ref 11.5–15.5)
WBC: 20.9 10*3/uL — ABNORMAL HIGH (ref 4.0–10.5)

## 2017-09-12 LAB — MAGNESIUM: Magnesium: 2.2 mg/dL (ref 1.7–2.4)

## 2017-09-12 LAB — GLUCOSE, CAPILLARY: Glucose-Capillary: 172 mg/dL — ABNORMAL HIGH (ref 65–99)

## 2017-09-12 MED ORDER — IPRATROPIUM-ALBUTEROL 0.5-2.5 (3) MG/3ML IN SOLN
3.0000 mL | Freq: Three times a day (TID) | RESPIRATORY_TRACT | Status: DC
  Administered 2017-09-12 – 2017-09-14 (×5): 3 mL via RESPIRATORY_TRACT
  Filled 2017-09-12 (×6): qty 3

## 2017-09-12 MED ORDER — METHYLPREDNISOLONE SODIUM SUCC 125 MG IJ SOLR
60.0000 mg | Freq: Two times a day (BID) | INTRAMUSCULAR | Status: DC
  Administered 2017-09-12 – 2017-09-13 (×3): 60 mg via INTRAVENOUS
  Filled 2017-09-12 (×3): qty 2

## 2017-09-12 MED ORDER — KETOROLAC TROMETHAMINE 30 MG/ML IJ SOLN
30.0000 mg | Freq: Four times a day (QID) | INTRAMUSCULAR | Status: DC | PRN
  Administered 2017-09-12 – 2017-09-14 (×7): 30 mg via INTRAVENOUS
  Filled 2017-09-12 (×8): qty 1

## 2017-09-12 NOTE — Consult Note (Signed)
[facility] CM Primary Care Navigator  09/12/2017  MAIJOR HORNIG [birth year] [id number]   Met withpatient and mother Jackelyn Poling) at the bedside to identify possible discharge needs.  Patient reports that he initially presented to his primary care provider's office with 3-4 days of cough, congestion, shortness of breath and lower back pain. His symptoms did not improve with use of home nebulizers and his oxygen level was at 91% which had led to this admission. Patient endorses Dorothea Ogle, PA with [facility] as theprimary care provider.   [facility] [place] [facility].   Patient states that wife Vida Roller) manages his medications at home with use of "pill box" system filled weekly.   Patient verbalized that mother or wife has been providing transportation tohisdoctors'appointments.  Patient's wife is theprimary caregiver at home and son (who lives with them) assists with his care when needed.   Anticipateddischarge plan ishome according to patient.  Patient expressedunderstanding to call hisprimary care provider's officewhen he gets home,for a post hospital follow-upwithin a week or sooner if needed.Patient letter (with PCP's contact number) was provided as a reminder.   Discussed withpatient and mother regarding[facility] CM services available for health management at home. Explained topatient about COPD action plan as well. Patient expressed reluctance for home visits but opted andverbally agreed only to[facility] COPDcalls for follow-up of his recovery.   [facility] COPD[facility] after discharge.  Patient voicedunderstandingto seekreferral from primary care provider to [facility] care management if deemed necessaryfor further services in the future.  [facility] care management information provided for future needs that may arise.    For questions, please  contact:  Dannielle Huh, BSN, RN- [facility] Primary Care Navigator  Telephone: [phone] [place]

## 2017-09-12 NOTE — Progress Notes (Signed)
[facility] Progress Note  Patient: Eric Hayes [id number]   PCP: Carlena Hurl, PA-C DOB: 30-Sep-1976   DOA: 09/09/2017   DOS: 09/12/2017   Date of Service: the patient was seen and examined on 09/12/2017  Subjective:Remains short of breath, is less lethargic. Cough is still present, left back pain is still present.  Brief hospital course: Pt. 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 Currently further plan is continue antibiotics and IV steroids.  Assessment and Plan: 1. Left lobar pneumonia. Acute respiratory failure with hypoxia. COPD exacerbation with bronchospasm. Started on IV Levaquin since admission, blood cultures negative to date. Sputum culture multiple organisms, MRSA PCR positive, strep pneumo antigen negative. Solu-Medrol added on 09/10/2017, will continue high-dose steroids. Continue inhalers as well as nebulizer and Pulmicort. Respiratory virus panel influenza PCR negative as well. Pro-calcitonin negative, mild elevation of ESR. Patient has history of early organizing pneumonia. we'll refer the patient to pulmonary as an outpatient. We will reduce the steroids every 12 hours and add Toradol for pain control. Add incentive spirometry and flutter device.  2. Acute encephalopathy. Possibly metabolic possible due to hypoxia. Getting better seems to be at baseline. Resolved.  3. Obstructive sleep apnea. Continue C Pap.  4. Type 2 diabetes mellitus. Primarily diet controlled at home. Hemoglobin A1c 5.2. I would recheck tomorrow  On sliding scale here in the hospital and sugars are actually well controlled.  5. Left flank pain. Primary presented with fever chills and left flank pain. Has been on IV  antibiotics since admission. UA unremarkable. CT renal stone study negative for any intra-abdominal abnormality. No other acute intra-abdominal abnormality as well. from left lower lobe pneumonia, continue current pain control.  6. Mild hypokalemia. Currently resolved.  Diet: Carb modified diet DVT Prophylaxis: subcutaneous Heparin  Advance goals of care discussion: full code  Family Communication: no family was present at bedside, at the time of interview.  Disposition:  Discharge to home.  Consultants: none Procedures: none  Antibiotics: Anti-infectives    Start     Dose/Rate Route Frequency Ordered Stop   09/11/17 1500  levofloxacin (LEVAQUIN) tablet 750 mg  Status:  Discontinued     750 mg Oral Daily 09/11/17 1057 09/11/17 1058   09/11/17 1500  levofloxacin (LEVAQUIN) tablet 750 mg     750 mg Oral Daily 09/11/17 1058 09/14/17 1459   09/10/17 1500  levofloxacin (LEVAQUIN) IVPB 750 mg  Status:  Discontinued     750 mg 100 mL/hr over 90 Minutes Intravenous Every 24 hours 09/09/17 1539 09/11/17 1057   09/09/17 1430  levofloxacin (LEVAQUIN) IVPB 750 mg     750 mg 100 mL/hr over 90 Minutes Intravenous  Once 09/09/17 1428 09/09/17 1638       Objective: Physical Exam: Vitals:   09/12/17 0735 09/12/17 0740 09/12/17 1352 09/12/17 1447  BP:   (!) 155/86   Pulse:   92   Resp:   20   Temp:   (!) 97.5 F (36.4 C)   TempSrc:   Oral   SpO2: 95% 96% 95% 97%  Weight:      Height:        Intake/Output Summary (Last 24 hours) at 09/12/17 1816 Last data filed at 09/12/17 1300  Gross per  24 hour  Intake              480 ml  Output             1400 ml  Net             -920 ml   Filed Weights   09/09/17 1220 09/12/17 0417  Weight: 132.5 kg (292 lb) 128.1 kg (282 lb 6.4 oz)   General: Alert, Awake and Oriented to Time, Place and Person. Appear in moderate distress, affect appropriate Eyes: PERRL, Conjunctiva normal ENT: Oral Mucosa clear moist. Neck: no JVD, no Abnormal  Mass Or lumps Cardiovascular: S1 and S2 Present, no Murmur, Peripheral Pulses Present Respiratory: increaed respiratory effort, Bilateral Air entry equal and Decreased, no use of accessory muscle, lef basal Crackles, bilateral expiratory  wheezes Abdomen: Bowel Sound present, Soft and no tenderness, no hernia Skin: no redness, no Rash, no induration Extremities: no Pedal edema, no calf tenderness Neurologic: Grossly no focal neuro deficit. Bilaterally Equal motor strength  Data Reviewed: CBC:  Recent Labs Lab 09/09/17 1149 09/10/17 0612 09/11/17 0425 09/12/17 0706  WBC 12.7* 21.1* 20.3* 20.9*  HGB 12.7* 12.8* 12.9* 12.8*  HCT 40.3 40.2 40.1 40.9  MCV 93.9 92.2 92.2 92.3  PLT 333 348 383 916   Basic Metabolic Panel:  Recent Labs Lab 09/09/17 1149 09/10/17 0612 09/11/17 0425 09/12/17 0706  NA 138 138 139 139  K 3.3* 3.7 4.1 4.2  CL 105 105 105 104  CO2 '26 24 25 26  ' GLUCOSE 154* 171* 153* 182*  BUN '6 6 9 11  ' CREATININE 0.88 0.72 0.63 0.70  CALCIUM 8.3* 8.7* 8.7* 8.7*  MG  --   --  2.0 2.2    Liver Function Tests:  Recent Labs Lab 09/10/17 0612  AST 16  ALT 16*  ALKPHOS 84  BILITOT 0.5  PROT 6.1*  ALBUMIN 2.8*   No results for input(s): LIPASE, AMYLASE in the last 168 hours. No results for input(s): AMMONIA in the last 168 hours. Coagulation Profile: No results for input(s): INR, PROTIME in the last 168 hours. Cardiac Enzymes:  Recent Labs Lab 09/09/17 1703  CKTOTAL 105   BNP (last 3 results) No results for input(s): PROBNP in the last 8760 hours. CBG: No results for input(s): GLUCAP in the last 168 hours. Studies: No results found.  Scheduled Meds: . amphetamine-dextroamphetamine  60 mg Oral Q breakfast  . arformoterol  15 mcg Nebulization BID  . budesonide (PULMICORT) nebulizer solution  0.5 mg Nebulization BID  . Chlorhexidine Gluconate Cloth  6 each Topical Q0600  . enoxaparin (LOVENOX) injection  65 mg Subcutaneous Q24H  .  ipratropium-albuterol  3 mL Nebulization TID  . levofloxacin  750 mg Oral Daily  . methylPREDNISolone (SOLU-MEDROL) injection  60 mg Intravenous Q12H  . mupirocin ointment  1 application Nasal BID  . nicotine  14 mg Transdermal Daily  . pneumococcal 23 valent vaccine  0.5 mL Intramuscular Tomorrow-1000  . pregabalin  100 mg Oral BID   Continuous Infusions: PRN Meds: acetaminophen **OR** acetaminophen, alprazolam, ketorolac, ondansetron **OR** ondansetron (ZOFRAN) IV  Time spent: 35 minutes  Author: Berle Mull, MD [facility] Pager: [phone] 09/12/2017 6:16 PM  If 7PM-7AM, please contact night-coverage at [email or web address], password [facility]

## 2017-09-12 NOTE — Progress Notes (Signed)
RT NOTE:  Pt does not wish to wear CPAP tonight. Will attempt tomorrow night.

## 2017-09-13 LAB — BASIC METABOLIC PANEL
ANION GAP: 6 (ref 5–15)
BUN: 17 mg/dL (ref 6–20)
CHLORIDE: 105 mmol/L (ref 101–111)
CO2: 27 mmol/L (ref 22–32)
Calcium: 8.5 mg/dL — ABNORMAL LOW (ref 8.9–10.3)
Creatinine, Ser: 0.72 mg/dL (ref 0.61–1.24)
GFR calc non Af Amer: >60 mL/min (ref >60)
Glucose, Bld: 145 mg/dL — ABNORMAL HIGH (ref 65–99)
POTASSIUM: 4.4 mmol/L (ref 3.5–5.1)
Sodium: 138 mmol/L (ref 135–145)

## 2017-09-13 LAB — GLUCOSE, CAPILLARY
GLUCOSE-CAPILLARY: 141 mg/dL — AB (ref 65–99)
GLUCOSE-CAPILLARY: 162 mg/dL — AB (ref 65–99)
GLUCOSE-CAPILLARY: 217 mg/dL — AB (ref 65–99)
Glucose-Capillary: 147 mg/dL — ABNORMAL HIGH (ref 65–99)

## 2017-09-13 LAB — CBC
HEMATOCRIT: 40.9 % (ref 39.0–52.0)
HEMOGLOBIN: 13.1 g/dL (ref 13.0–17.0)
MCH: 29.4 pg (ref 26.0–34.0)
MCHC: 32 g/dL (ref 30.0–36.0)
MCV: 91.9 fL (ref 78.0–100.0)
Platelets: 364 10*3/uL (ref 150–400)
RBC: 4.45 MIL/uL (ref 4.22–5.81)
RDW: 13.3 % (ref 11.5–15.5)
WBC: 21.7 10*3/uL — AB (ref 4.0–10.5)

## 2017-09-13 LAB — HEMOGLOBIN A1C
HEMOGLOBIN A1C: 6 % — AB (ref 4.8–5.6)
MEAN PLASMA GLUCOSE: 125.5 mg/dL

## 2017-09-13 MED ORDER — PREDNISONE 50 MG PO TABS
50.0000 mg | ORAL_TABLET | Freq: Every day | ORAL | Status: DC
  Administered 2017-09-14: 50 mg via ORAL
  Filled 2017-09-13: qty 1

## 2017-09-13 NOTE — Progress Notes (Signed)
Pt resting in bed quietly. Easily aroused. C/o lower back pain and pain management is administered per order and effective. CHG bath completed this morning at 0600 with gown changed. Pt educated on how to swab nare with bactroban for MRSA in the nares treatment. neurochecks remian unchanged with pupil size 60mm and brisk hand grasps strong and feet are strong as well. Very pleasant. Refused to wear cpap this shift as he c/o that he "feels like he was smothering on last night." continues on continuous pulse ox and probe came off of finger in which caused the machine to alarm which was stopped with reestablishing contact with the pt's finger. Safety maintained. callbell within reach. Will continue to monitor.

## 2017-09-13 NOTE — Progress Notes (Signed)
Patient states he will have RN contact RT when ready for CPAP.

## 2017-09-13 NOTE — Care Management Note (Addendum)
Case Management Note  Patient Details  Name: Eric Hayes MRN: [id number] Date of Birth: July 19, 1976  Subjective/Objective:               Admitted with asthma exacerbation. From home with wife. Independent with ADL's. DME: nebulizer.  Lowella Curb (Spouse) [name] (Mother)    [phone]      PCP: [name]   Action/Plan: Plan is to return home when medically stable. CM following for disposition needs. Pt states will have transportation to home @ d/c.  Expected Discharge Date:   09/14/2017           Expected Discharge Plan:  Home/Self Care  In-House Referral:     Discharge planning Services  CM Consult  Post Acute Care Choice:    Choice offered to:     DME Arranged:    DME Agency:     HH Arranged:    HH Agency:     Status of Service:  In process, will continue to follow  If discussed at Long Length of Stay Meetings, dates discussed:    Additional Comments:  Sharin Mons, RN 09/13/2017, 2:16 PM

## 2017-09-13 NOTE — Progress Notes (Signed)
[facility] Progress Note  Patient: Eric Hayes [id number]   PCP: Carlena Hurl, PA-C DOB: 10-May-1976   DOA: 09/09/2017   DOS: 09/13/2017   Date of Service: the patient was seen and examined on 09/13/2017  Subjective:Pain control improved. Breathing improved as well. Still has cough. Tachypneic.  Brief hospital course: Pt. 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 Currently further plan is continue antibiotics and changed to by mouth steroids and monitor  Assessment and Plan: 1. Left lobar pneumonia. Acute respiratory failure with hypoxia. COPD exacerbation with bronchospasm. Started on IV Levaquin since admission, blood cultures negative to date. Sputum culture multiple organisms, MRSA PCR positive, strep pneumo antigen negative. Solu-Medrol added on 09/10/2017, will continue high-dose steroids. Continue inhalers as well as nebulizer and Pulmicort. Respiratory virus panel influenza PCR negative as well. Pro-calcitonin negative, mild elevation of ESR. Patient has history of early organizing pneumonia. we'll refer the patient to pulmonary as an outpatient. Changed to by mouth prednisone. Continue when necessary Toradol. Add incentive spirometry and flutter device.  2. Acute encephalopathy. Possibly metabolic possible due to hypoxia. Getting better seems to be at baseline. Resolved.  3. Obstructive sleep apnea. Continue C Pap.  4. Type 2 diabetes mellitus. Primarily diet controlled at home. Hemoglobin A1c 6 this admission On sliding scale here in the hospital and sugars are actually well controlled.  5. Left flank pain. Primary presented with fever chills and left flank pain. Has been on IV antibiotics since  admission. UA unremarkable. CT renal stone study negative for any intra-abdominal abnormality. No other acute intra-abdominal abnormality as well. from left lower lobe pneumonia, continue current pain control.  6. Mild hypokalemia. Currently resolved.  Diet: Carb modified diet DVT Prophylaxis: subcutaneous Heparin  Advance goals of care discussion: full code  Family Communication: no family was present at bedside, at the time of interview.  Disposition:  Discharge to home.  Consultants: none Procedures: none  Antibiotics: Anti-infectives    Start     Dose/Rate Route Frequency Ordered Stop   09/11/17 1500  levofloxacin (LEVAQUIN) tablet 750 mg  Status:  Discontinued     750 mg Oral Daily 09/11/17 1057 09/11/17 1058   09/11/17 1500  levofloxacin (LEVAQUIN) tablet 750 mg     750 mg Oral Daily 09/11/17 1058 09/13/17 1442   09/10/17 1500  levofloxacin (LEVAQUIN) IVPB 750 mg  Status:  Discontinued     750 mg 100 mL/hr over 90 Minutes Intravenous Every 24 hours 09/09/17 1539 09/11/17 1057   09/09/17 1430  levofloxacin (LEVAQUIN) IVPB 750 mg     750 mg 100 mL/hr over 90 Minutes Intravenous  Once 09/09/17 1428 09/09/17 1638       Objective: Physical Exam: Vitals:   09/13/17 0600 09/13/17 0857 09/13/17 1340 09/13/17 1424  BP: (!) 150/95  (!) 134/96   Pulse: 96  (!) 107   Resp:      Temp: 98 F (36.7 C)     TempSrc: Oral     SpO2: 97% 94% 98% 99%  Weight: 102 kg (224 lb 13.9 oz)     Height:        Intake/Output Summary (Last 24 hours) at 09/13/17 1814 Last data filed at 09/13/17 0700  Gross per 24 hour  Intake              600 ml  Output              980 ml  Net             -380 ml   Filed Weights   09/09/17 1220 09/12/17 0417 09/13/17 0600  Weight: 132.5 kg (292 lb) 128.1 kg (282 lb 6.4 oz) 102 kg (224 lb 13.9 oz)   General: Alert, Awake and Oriented to Time, Place and Person. Appear in moderate distress, affect appropriate Eyes: PERRL, Conjunctiva normal ENT:  Oral Mucosa clear moist. Neck: no JVD, no Abnormal Mass Or lumps Cardiovascular: S1 and S2 Present, no Murmur, Peripheral Pulses Present Respiratory: increaed respiratory effort, Bilateral Air entry equal and Decreased, no use of accessory muscle, lef basal Crackles, bilateral expiratory  wheezes Abdomen: Bowel Sound present, Soft and no tenderness, no hernia Skin: no redness, no Rash, no induration Extremities: no Pedal edema, no calf tenderness Neurologic: Grossly no focal neuro deficit. Bilaterally Equal motor strength  Data Reviewed: CBC:  Recent Labs Lab 09/09/17 1149 09/10/17 0612 09/11/17 0425 09/12/17 0706 09/13/17 0442  WBC 12.7* 21.1* 20.3* 20.9* 21.7*  HGB 12.7* 12.8* 12.9* 12.8* 13.1  HCT 40.3 40.2 40.1 40.9 40.9  MCV 93.9 92.2 92.2 92.3 91.9  PLT 333 348 383 385 161   Basic Metabolic Panel:  Recent Labs Lab 09/09/17 1149 09/10/17 0612 09/11/17 0425 09/12/17 0706 09/13/17 0442  NA 138 138 139 139 138  K 3.3* 3.7 4.1 4.2 4.4  CL 105 105 105 104 105  CO2 _0 GLUCOSE 154* 171* 153* 182* 145*  BUN _1 CREATININE 0.88 0.72 0.63 0.70 0.72  CALCIUM 8.3* 8.7* 8.7* 8.7* 8.5*  MG  --   --  2.0 2.2  --     Liver Function Tests:  Recent Labs Lab 09/10/17 0612  AST 16  ALT 16*  ALKPHOS 84  BILITOT 0.5  PROT 6.1*  ALBUMIN 2.8*   No results for input(s): LIPASE, AMYLASE in the last 168 hours. No results for input(s): AMMONIA in the last 168 hours. Coagulation Profile: No results for input(s): INR, PROTIME in the last 168 hours. Cardiac Enzymes:  Recent Labs Lab 09/09/17 1703  CKTOTAL 105   BNP (last 3 results) No results for input(s): PROBNP in the last 8760 hours. CBG:  Recent Labs Lab 09/12/17 2115 09/13/17 0752 09/13/17 1201 09/13/17 1721  GLUCAP 172* 141* 147* 162*   Studies: No results found.  Scheduled Meds: . amphetamine-dextroamphetamine  60 mg Oral Q breakfast  . arformoterol  15 mcg Nebulization BID  .  budesonide (PULMICORT) nebulizer solution  0.5 mg Nebulization BID  . Chlorhexidine Gluconate Cloth  6 each Topical Q0600  . enoxaparin (LOVENOX) injection  65 mg Subcutaneous Q24H  . ipratropium-albuterol  3 mL Nebulization TID  . methylPREDNISolone (SOLU-MEDROL) injection  60 mg Intravenous Q12H  . mupirocin ointment  1 application Nasal BID  . nicotine  14 mg Transdermal Daily  . pneumococcal 23 valent vaccine  0.5 mL Intramuscular Tomorrow-1000  . pregabalin  100 mg Oral BID   Continuous Infusions: PRN Meds: acetaminophen **OR** acetaminophen, alprazolam, ketorolac, ondansetron **OR** ondansetron (ZOFRAN) IV  Time spent: 35 minutes  Author: Berle Mull, MD [facility] Pager: [phone] 09/13/2017 6:14 PM  If 7PM-7AM, please contact night-coverage at [email or web address], password [facility]

## 2017-09-13 NOTE — Progress Notes (Signed)
Pt at rest was 95%. After walking around loop still 95%. Walked at least 317ft and still 95%. After returning to bed O2 sat 97%. At this time no O2 needed. Will continue to monitor

## 2017-09-14 LAB — CULTURE, BLOOD (ROUTINE X 2)
Culture: NO GROWTH
Culture: NO GROWTH
SPECIAL REQUESTS: ADEQUATE
SPECIAL REQUESTS: ADEQUATE

## 2017-09-14 LAB — CBC
HEMATOCRIT: 40.6 % (ref 39.0–52.0)
HEMOGLOBIN: 12.9 g/dL — AB (ref 13.0–17.0)
MCH: 28.9 pg (ref 26.0–34.0)
MCHC: 31.8 g/dL (ref 30.0–36.0)
MCV: 91 fL (ref 78.0–100.0)
Platelets: 302 10*3/uL (ref 150–400)
RBC: 4.46 MIL/uL (ref 4.22–5.81)
RDW: 13.5 % (ref 11.5–15.5)
WBC: 15.8 10*3/uL — ABNORMAL HIGH (ref 4.0–10.5)

## 2017-09-14 LAB — GLUCOSE, CAPILLARY: GLUCOSE-CAPILLARY: 87 mg/dL (ref 65–99)

## 2017-09-14 LAB — BASIC METABOLIC PANEL
Anion gap: 4 — ABNORMAL LOW (ref 5–15)
BUN: 22 mg/dL — AB (ref 6–20)
CHLORIDE: 105 mmol/L (ref 101–111)
CO2: 27 mmol/L (ref 22–32)
Calcium: 7.9 mg/dL — ABNORMAL LOW (ref 8.9–10.3)
Creatinine, Ser: 0.77 mg/dL (ref 0.61–1.24)
GFR calc Af Amer: >60 mL/min (ref >60)
GFR calc non Af Amer: >60 mL/min (ref >60)
GLUCOSE: 118 mg/dL — AB (ref 65–99)
POTASSIUM: 3.8 mmol/L (ref 3.5–5.1)
Sodium: 136 mmol/L (ref 135–145)

## 2017-09-14 MED ORDER — LEVALBUTEROL HCL 0.63 MG/3ML IN NEBU: INHALATION_SOLUTION | RESPIRATORY_TRACT | 0 refills | Status: DC

## 2017-09-14 MED ORDER — ALBUTEROL SULFATE HFA 108 (90 BASE) MCG/ACT IN AERS: 2.0000 | INHALATION_SPRAY | Freq: Four times a day (QID) | RESPIRATORY_TRACT | 2 refills | Status: DC | PRN

## 2017-09-14 MED ORDER — NAPROXEN 500 MG PO TABS: 500.0000 mg | ORAL_TABLET | Freq: Two times a day (BID) | ORAL | 0 refills | Status: AC

## 2017-09-14 MED ORDER — NICOTINE 14 MG/24HR TD PT24: 14.0000 mg | MEDICATED_PATCH | Freq: Every day | TRANSDERMAL | 0 refills | Status: DC

## 2017-09-14 MED ORDER — MOMETASONE FURO-FORMOTEROL FUM 200-5 MCG/ACT IN AERO: 2.0000 | INHALATION_SPRAY | Freq: Two times a day (BID) | RESPIRATORY_TRACT | 0 refills | Status: DC

## 2017-09-14 MED ORDER — POLYETHYLENE GLYCOL 3350 17 G PO PACK: 17.0000 g | PACK | Freq: Every day | ORAL | 0 refills | Status: AC

## 2017-09-14 MED ORDER — TIOTROPIUM BROMIDE MONOHYDRATE 18 MCG IN CAPS: 18.0000 ug | ORAL_CAPSULE | Freq: Every day | RESPIRATORY_TRACT | 2 refills | Status: DC

## 2017-09-14 MED ORDER — FAMOTIDINE 20 MG PO TABS: 20.0000 mg | ORAL_TABLET | Freq: Every day | ORAL | 0 refills | Status: DC

## 2017-09-14 MED ORDER — PREDNISONE 10 MG PO TABS: ORAL_TABLET | ORAL | 0 refills | Status: DC

## 2017-09-14 MED ORDER — POLYETHYLENE GLYCOL 3350 17 G PO PACK
17.0000 g | PACK | Freq: Every day | ORAL | Status: DC
  Administered 2017-09-14: 17 g via ORAL
  Filled 2017-09-14: qty 1

## 2017-09-14 NOTE — Progress Notes (Signed)
Patient given discharge info. Skin intact, taken down with wheelchair.

## 2017-09-18 NOTE — Discharge Summary (Signed)
[facility] Discharge Summary   Patient: Eric Hayes OFB:[id number]   PCP: Carlena Hurl, PA-C DOB: 04-24-1976   Date of admission: 09/09/2017   Date of discharge: 09/14/2017   Discharge Diagnoses:  Principal Problem:   Asthma exacerbation in COPD [facility]) Active Problems:   Diet-controlled diabetes mellitus ([place])   Sleep apnea   CAP (community acquired pneumonia)   Hypertension   Acute encephalopathy   Bipolar disorder ([place])   Mild dehydration   Hoarseness  Admitted From: home Disposition:  home  Recommendations for Outpatient Follow-up:  1. Follow-up with PCP in one week 2. Follow-up with and establish care with pulmonary   Follow-up Information    Tysinger, Camelia Eng, PA-C. Schedule an appointment as soon as possible for a visit in 1 week(s).   Specialty:  Family Medicine Contact information: [place] [address] [phone]        Sinda Du, MD. Schedule an appointment as soon as possible for a visit in 1 month(s).   Specialty:  Pulmonary Disease Contact information: [place] [address] [phone]          Diet recommendation: carb modified diet  Activity: The patient is advised to gradually reintroduce usual activities.  Discharge Condition: good  Code Status: full code  History of present illness: As per the H and P dictated on admission, "Eric Hayes is a 41 y.o. male with medical history significant for COPD and ongoing tobacco abuse, bipolar disorder, hypertension, sleep apnea, and diet-controlled diabetes; there is also a history of prior right VATS for early organizing empyema in November 7619 with a complicated hospital course of septic shock and acute kidney injury with requirement for vasopressors and mechanical ventilation/intubation. Since extubation patient reports chronic hoarseness. He initially presented to his PCPs office today reporting 3-4 days of cough,  congestion, shortness of breath and productive cough with sputum that was brown. He's been having back pain and difficulty urinating. He's had increased work of breathing. Symptoms have not improved with use of his home nebulizers. He has had sick contacts noting his children have had episodic emesis. He apparently was recently started on a new medication by his psychiatrist in the past week. He also admitted to his PCP that he had been using 1-2 tablets of his wife's pain medication for his back pain on 10/7. At presentation to PCP his pulse ox was 91% and after evaluation it was felt that the patient needed to be sent to the ER for possible direct admission to the hospital. In addition to above patient was reported with difficulty breathing, slurred speech and slow to respond to questions asked as compared to his baseline and was slow to ambulate but did not have gait disturbance. He did report poor oral intake of the past 3-4 days since onset of symptoms.  In the ER he was not hypoxemic at rest. His ABG showed borderline hypoxemia with a PO2 of 60 which is his baseline. Lactic acid and troponin were normal. White count was elevated at 12,700 differential was not obtained. Chest x-ray revealed improvement in interstitial edema with persistent right small effusion. Because of the back pain a CT renal stone study was obtained which revealed patchy nodular ground glass densities partially visible lysed in the left lower lobe concerning for infectious etiology. He was afebrile, slightly tachycardic but otherwise normotensive. 2 L oxygen has been applied in the ER. There was concern over pneumonia so Levaquin has been given."  Hospital Course:  Summary of his active problems in the hospital is as following. 1. Left lobar pneumonia. Acute respiratory failure with hypoxia. COPD exacerbation with bronchospasm. Started on IV Levaquin since admission, blood cultures negative to date. Sputum culture multiple  organisms, MRSA PCR positive, strep pneumo antigen negative. Solu-Medrol added on 09/10/2017, will continue high-dose steroids. Continue inhalers as well as nebulizer and Pulmicort. Respiratory virus panel influenza PCR negative as well. Pro-calcitonin negative, mild elevation of ESR. Patient has history of early organizing pneumonia. we'll refer the patient to pulmonary as an outpatient. Changed to by mouth prednisone.  Add incentive spirometry and flutter device.  2. Acute encephalopathy. Possibly metabolic possible due to hypoxia. Getting better seems to be at baseline. Resolved.  3. Obstructive sleep apnea. Continue C Pap.  4. Type 2 diabetes mellitus. Primarily diet controlled at home. Hemoglobin A1c 6 this admission On sliding scale here in the hospital and sugars are actually well controlled.  5. Left flank pain. Primary presented with fever chills and left flank pain. Has been on IV antibiotics since admission. UA unremarkable. CT renal stone study negative for any intra-abdominal abnormality. No other acute intra-abdominal abnormality as well. from left lower lobe pneumonia, continue current pain control.  6. Mild hypokalemia. Currently resolved.  All other chronic medical condition were stable during the hospitalization.  Patient was ambulatory without any assistance. On the day of the discharge the patient's vitals were stable, and no other acute medical condition were reported by patient. the patient was felt safe to be discharge at home with familt.  Procedures and Results:  none   Consultations:  nonoe  DISCHARGE MEDICATION: Discharge Medication List as of 09/14/2017 11:42 AM    START taking these medications   Details  albuterol (PROVENTIL HFA;VENTOLIN HFA) 108 (90 Base) MCG/ACT inhaler Inhale 2 puffs into the lungs every 6 (six) hours as needed for wheezing or shortness of breath., Starting Sat 09/14/2017, Normal    famotidine (PEPCID) 20 MG  tablet Take 1 tablet (20 mg total) by mouth daily., Starting Sat 09/14/2017, Until Tue 09/24/2017, Normal    mometasone-formoterol (DULERA) 200-5 MCG/ACT AERO Inhale 2 puffs into the lungs 2 (two) times daily., Starting Sat 09/14/2017, Normal    naproxen (NAPROSYN) 500 MG tablet Take 1 tablet (500 mg total) by mouth 2 (two) times daily with a meal., Starting Sat 09/14/2017, Until Sat 09/21/2017, Normal    nicotine (NICODERM CQ - DOSED IN MG/24 HOURS) 14 mg/24hr patch Place 1 patch (14 mg total) onto the skin daily., Starting Sat 09/14/2017, Normal    polyethylene glycol (MIRALAX / GLYCOLAX) packet Take 17 g by mouth daily., Starting Sat 09/14/2017, Normal    predniSONE (DELTASONE) 10 MG tablet Take 54m daily for 3days,Take 482mdaily for 3days,Take 3026maily for 3days,Take 68m49mily for 3days,Take 10mg47mly for 3days, then stop., Print    tiotropium (SPIRIVA HANDIHALER) 18 MCG inhalation capsule Place 1 capsule (18 mcg total) into inhaler and inhale daily., Starting Sat 09/14/2017, Until Sun 09/14/2018, Normal      CONTINUE these medications which have CHANGED   Details  levalbuterol (XOPENEX) 0.63 MG/3ML nebulizer solution USE 1 VIAL(3 ML) IN NEBULIZER EVERY 6 HOURS AS NEEDED FOR WHEEZING OR SHORTNESS OF BREATH, Normal      CONTINUE these medications which have NOT CHANGED   Details  alprazolam (XANAX) 2 MG tablet Take 1 tablet (2 mg total) by mouth 3 (three) times daily as needed for anxiety., Starting Thu 11/08/2016, Print    amphetamine-dextroamphetamine (  ADDERALL) 30 MG tablet TAKE 2 AND 1/2 TABLETS BY MOUTH ONCE DAILY IN THE MORNING, Historical Med    LYRICA 150 MG capsule TAKE ONE CAPSULE BY MOUTH TWICE DAILY, Historical Med    pravastatin (PRAVACHOL) 20 MG tablet Take 1 tablet (20 mg total) by mouth daily., Starting Tue 12/11/2016, Normal       Allergies  Allergen Reactions  . Penicillins Hives and Swelling    AS A CHILD, has tolerated ceftriaxone and cephalexin in past.      Discharge Instructions    Ambulatory referral to Pulmonology    Complete by:  As directed    Diet Carb Modified    Complete by:  As directed    Discharge instructions    Complete by:  As directed    It is important that you read following instructions as well as go over your medication list with RN to help you understand your care after this hospitalization.  Discharge Instructions: Please follow-up with PCP in one week  Please request your primary care physician to go over all Hospital Tests and Procedure/Radiological results at the follow up,  Please get all Hospital records sent to your PCP by signing hospital release before you go home.   Do not take more than prescribed Pain, Sleep and Anxiety Medications. You were cared for by a hospitalist during your hospital stay. If you have any questions about your discharge medications or the care you received while you were in the hospital after you are discharged, you can call the unit and ask to speak with the hospitalist on call if the hospitalist that took care of you is not available.  Once you are discharged, your primary care physician will handle any further medical issues. Please note that NO REFILLS for any discharge medications will be authorized once you are discharged, as it is imperative that you return to your primary care physician (or establish a relationship with a primary care physician if you do not have one) for your aftercare needs so that they can reassess your need for medications and monitor your lab values. You Must read complete instructions/literature along with all the possible adverse reactions/side effects for all the Medicines you take and that have been prescribed to you. Take any new Medicines after you have completely understood and accept all the possible adverse reactions/side effects. Wear Seat belts while driving. If you have smoked or chewed Tobacco in the last 2 yrs please stop smoking and/or stop any  Recreational drug use.   Increase activity slowly    Complete by:  As directed      Discharge Exam: Filed Weights   09/12/17 0417 09/13/17 0600 09/14/17 0545  Weight: 128.1 kg (282 lb 6.4 oz) 102 kg (224 lb 13.9 oz) 129.6 kg (285 lb 11.2 oz)   Vitals:   09/14/17 0545 09/14/17 0819  BP: (!) 146/98   Pulse: 85   Resp: 18   Temp: 97.7 F (36.5 C)   SpO2: 96% 94%   General: Appear in no distress, no Rash; Oral Mucosa moist. Cardiovascular: S1 and S2 Present, no Murmur, no JVD Respiratory: Bilateral Air entry present and basal Crackles, no wheezes Abdomen: Bowel Sound present, Soft and no tenderness Extremities: no Pedal edema, no calf tenderness Neurology: Grossly no focal neuro deficit.  The results of significant diagnostics from this hospitalization (including imaging, microbiology, ancillary and laboratory) are listed below for reference.    Significant Diagnostic Studies: Dg Chest 2 View  Result Date: 09/09/2017 CLINICAL  DATA:  LEFT flank pain Urinary retention EXAM: CHEST  2 VIEW COMPARISON:  [id number] FINDINGS: Normal cardiac silhouette. Interval improvement in interstitial edema pattern. Small RIGHT effusion noted. No consolidation. No pneumothorax. IMPRESSION: Improvement interstitial edema.  Persistent small RIGHT effusion Electronically Signed   By: Suzy Bouchard M.D.   On: 09/09/2017 15:06   Ct Chest Wo Contrast  Result Date: 09/10/2017 CLINICAL DATA:  Shortness of breath and suspected upper respiratory infection. History of prior pneumonia and empyema. EXAM: CT CHEST WITHOUT CONTRAST TECHNIQUE: Multidetector CT imaging of the chest was performed following the standard protocol without IV contrast. COMPARISON:  Chest x-ray 09/09/2017.  Chest CT 10/28/2016 FINDINGS: Cardiovascular: The heart is normal in size. No pericardial effusion. The aorta is normal in caliber. Minimal scattered atherosclerotic calcifications at the aortic arch. Mediastinum/Nodes: Borderline enlarged  mediastinal and left hilar lymph nodes likely inflammatory/ hyperplastic. Right paraesophageal lymph node on image number 91 measures 11.5 mm. Aorticopulmonary window lymph node on image number 92 measures 11 mm. 11 mm subcarinal lymph node on image number 117. Lungs/Pleura: Patchy subsegmental atelectasis or clearing infiltrate noted in the right lower lobe the and also minimally in the right middle lobe. Airspace opacity in the left lower lobe is also noted and likely infiltrate. I do not see a discrete mass or worrisome pulmonary nodules. No pleural effusion. Mild nodularity at the left lung base is likely airspace disease. No pleural effusion. Upper Abdomen: Mild eventration of the right hemidiaphragm. No significant upper abdominal findings. Musculoskeletal: No chest wall mass, supraclavicular or axillary lymphadenopathy. The thyroid gland appears normal. No significant bony findings. IMPRESSION: 1. The CT findings most consistent with patchy bilateral bronchopneumonia, most significant in the left lower lobe. There are also inflamed/hyperplastic mediastinal and left hilar lymph nodes. 2. No discrete mass or worrisome pulmonary lesions. 3. Would recommend a followup post treatment chest x-ray to make sure these findings resolve. Aortic Atherosclerosis (ICD10-I70.0). Electronically Signed   By: Marijo Sanes M.D.   On: 09/10/2017 10:27   Ct Renal Stone Study  Result Date: 09/09/2017 CLINICAL DATA:  Initial evaluation for acute left-sided flank pain, urinary retention. EXAM: CT ABDOMEN AND PELVIS WITHOUT CONTRAST TECHNIQUE: Multidetector CT imaging of the abdomen and pelvis was performed following the standard protocol without IV contrast. COMPARISON:  Prior CT from 08/01/2016. FINDINGS: Lower chest: Patchy in nodular ground-glass densities partially visualized within the left lower lobe, nonspecific, but suspicious for possible infection. Superimposed bibasilar atelectasis and/ or scarring. Visualized lung  bases otherwise clear. Hepatobiliary: Limited noncontrast evaluation of the liver is unremarkable. Gallbladder within normal limits. No biliary dilatation. Pancreas: Pancreas within normal limits. Spleen: Spleen within normal limits. Adrenals/Urinary Tract: Adrenal glands are normal. Kidneys equal in size without evidence for nephrolithiasis or hydronephrosis. No radiopaque calculi seen along the course of either renal collecting system. No hydroureter. Bladder within normal limits. No layering stones within the bladder lumen. Stomach/Bowel: Stomach within normal limits. No evidence for bowel obstruction. No acute inflammatory changes seen about the bowels. Appendix not visualize, consistent with history prior appendectomy. Vascular/Lymphatic: Intra-abdominal aorta of normal caliber. No adenopathy. Reproductive: Prostate normal. Other: No free air or fluid. Musculoskeletal: No acute osseus abnormality. No worrisome lytic or blastic osseous lesions. Sclerotic focus within the right iliac wing adjacent to the right SI joint most likely reflects a benign bone island. IMPRESSION: 1. No CT evidence for nephrolithiasis or obstructive uropathy. 2. Patchy and nodular ground-glass densities within the partially visualized left lower lobe, nonspecific, but could reflect the  sequelae of infection/pneumonia in the correct clinical setting. 3. No other acute intra-abdominal or pelvic process. Electronically Signed   By: Jeannine Boga M.D.   On: 09/09/2017 14:07    Microbiology: Recent Results (from the past 240 hour(s))  Culture, blood (routine x 2) Call MD if unable to obtain prior to antibiotics being given     Status: None   Collection Time: 09/09/17 12:30 PM  Result Value Ref Range Status   Specimen Description BLOOD LEFT ARM  Final   Special Requests   Final    BOTTLES DRAWN AEROBIC AND ANAEROBIC Blood Culture adequate volume   Culture NO GROWTH 5 DAYS  Final   Report Status 09/14/2017 FINAL  Final    Culture, blood (routine x 2) Call MD if unable to obtain prior to antibiotics being given     Status: None   Collection Time: 09/09/17  3:05 PM  Result Value Ref Range Status   Specimen Description BLOOD LEFT HAND  Final   Special Requests   Final    BOTTLES DRAWN AEROBIC AND ANAEROBIC Blood Culture adequate volume   Culture NO GROWTH 5 DAYS  Final   Report Status 09/14/2017 FINAL  Final  Respiratory Panel by PCR     Status: None   Collection Time: 09/09/17  3:33 PM  Result Value Ref Range Status   Adenovirus NOT DETECTED NOT DETECTED Final   Coronavirus 229E NOT DETECTED NOT DETECTED Final   Coronavirus HKU1 NOT DETECTED NOT DETECTED Final   Coronavirus NL63 NOT DETECTED NOT DETECTED Final   Coronavirus OC43 NOT DETECTED NOT DETECTED Final   Metapneumovirus NOT DETECTED NOT DETECTED Final   Rhinovirus / Enterovirus NOT DETECTED NOT DETECTED Final   Influenza A NOT DETECTED NOT DETECTED Final   Influenza B NOT DETECTED NOT DETECTED Final   Parainfluenza Virus 1 NOT DETECTED NOT DETECTED Final   Parainfluenza Virus 2 NOT DETECTED NOT DETECTED Final   Parainfluenza Virus 3 NOT DETECTED NOT DETECTED Final   Parainfluenza Virus 4 NOT DETECTED NOT DETECTED Final   Respiratory Syncytial Virus NOT DETECTED NOT DETECTED Final   Bordetella pertussis NOT DETECTED NOT DETECTED Final   Chlamydophila pneumoniae NOT DETECTED NOT DETECTED Final   Mycoplasma pneumoniae NOT DETECTED NOT DETECTED Final  Culture, Urine     Status: None   Collection Time: 09/09/17  4:05 PM  Result Value Ref Range Status   Specimen Description URINE, CLEAN CATCH  Final   Special Requests NONE  Final   Culture NO GROWTH  Final   Report Status 09/10/2017 FINAL  Final  Culture, respiratory (NON-Expectorated)     Status: Abnormal   Collection Time: 09/09/17  4:06 PM  Result Value Ref Range Status   Specimen Description SPUTUM  Final   Special Requests NONE  Final   Gram Stain   Final    MODERATE WBC PRESENT,  PREDOMINANTLY PMN FEW SQUAMOUS EPITHELIAL CELLS PRESENT MODERATE GRAM POSITIVE COCCI FEW GRAM POSITIVE RODS FEW GRAM NEGATIVE RODS    Culture MULTIPLE ORGANISMS PRESENT, NONE PREDOMINANT (A)  Final   Report Status 09/11/2017 FINAL  Final  MRSA PCR Screening     Status: Abnormal   Collection Time: 09/09/17  5:07 PM  Result Value Ref Range Status   MRSA by PCR POSITIVE (A) NEGATIVE Final    Comment:        The GeneXpert MRSA Assay (FDA approved for NASAL specimens only), is one component of a comprehensive MRSA colonization surveillance program. It is not  intended to diagnose MRSA infection nor to guide or monitor treatment for MRSA infections. RESULT CALLED TO, READ BACK BY AND VERIFIED WITH: K COBLE RN 2055 09/09/17 A BROWNING      Labs: CBC:  Recent Labs Lab 09/12/17 0706 09/13/17 0442 09/14/17 0734  WBC 20.9* 21.7* 15.8*  HGB 12.8* 13.1 12.9*  HCT 40.9 40.9 40.6  MCV 92.3 91.9 91.0  PLT 385 364 233   Basic Metabolic Panel:  Recent Labs Lab 09/12/17 0706 09/13/17 0442 09/14/17 0734  NA 139 138 136  K 4.2 4.4 3.8  CL 104 105 105  CO2 '26 27 27  ' GLUCOSE 182* 145* 118*  BUN 11 17 22*  CREATININE 0.70 0.72 0.77  CALCIUM 8.7* 8.5* 7.9*  MG 2.2  --   --    BNP (last 3 results)  Recent Labs  09/09/17 1149  BNP 68.4   CBG:  Recent Labs Lab 09/13/17 0752 09/13/17 1201 09/13/17 1721 09/13/17 2304 09/14/17 0816  GLUCAP 141* 147* 162* 217* 87   Time spent: 35 minutes  Signed:  Alycea Segoviano  [facility] 09/14/2017 , 11:29 AM

## 2017-09-26 ENCOUNTER — Other Ambulatory Visit: Payer: Self-pay | Admitting: Medical

## 2017-09-26 ENCOUNTER — Encounter: Payer: Self-pay | Admitting: Medical

## 2017-09-26 ENCOUNTER — Ambulatory Visit (INDEPENDENT_AMBULATORY_CARE_PROVIDER_SITE_OTHER): Payer: Medicare Other | Admitting: Medical

## 2017-09-26 VITALS — BP 118/68 | HR 110 | Wt 289.0 lb

## 2017-09-26 DIAGNOSIS — F17200 Nicotine dependence, unspecified, uncomplicated: Secondary | ICD-10-CM

## 2017-09-26 DIAGNOSIS — J441 Chronic obstructive pulmonary disease with (acute) exacerbation: Secondary | ICD-10-CM | POA: Diagnosis not present

## 2017-09-26 DIAGNOSIS — J45901 Unspecified asthma with (acute) exacerbation: Secondary | ICD-10-CM

## 2017-09-26 DIAGNOSIS — I159 Secondary hypertension, unspecified: Secondary | ICD-10-CM

## 2017-09-26 DIAGNOSIS — E119 Type 2 diabetes mellitus without complications: Secondary | ICD-10-CM | POA: Diagnosis not present

## 2017-09-26 DIAGNOSIS — F310 Bipolar disorder, current episode hypomanic: Secondary | ICD-10-CM

## 2017-09-26 DIAGNOSIS — J189 Pneumonia, unspecified organism: Secondary | ICD-10-CM | POA: Diagnosis not present

## 2017-09-26 DIAGNOSIS — Z23 Encounter for immunization: Secondary | ICD-10-CM | POA: Diagnosis not present

## 2017-09-26 DIAGNOSIS — R490 Dysphonia: Secondary | ICD-10-CM

## 2017-09-26 DIAGNOSIS — G4733 Obstructive sleep apnea (adult) (pediatric): Secondary | ICD-10-CM

## 2017-09-26 MED ORDER — NAPROXEN 500 MG PO TABS: 500.0000 mg | ORAL_TABLET | Freq: Two times a day (BID) | ORAL | 1 refills | Status: DC

## 2017-09-26 MED ORDER — NICOTINE 14 MG/24HR TD PT24
14.0000 mg | MEDICATED_PATCH | Freq: Every day | TRANSDERMAL | 0 refills | Status: DC
Start: 2017-09-26 — End: 2018-07-14

## 2017-09-26 MED ORDER — MOMETASONE FURO-FORMOTEROL FUM 200-5 MCG/ACT IN AERO: 2.0000 | INHALATION_SPRAY | Freq: Two times a day (BID) | RESPIRATORY_TRACT | 0 refills | Status: DC

## 2017-09-26 MED ORDER — FAMOTIDINE 20 MG PO TABS
20.0000 mg | ORAL_TABLET | Freq: Every day | ORAL | 1 refills | Status: DC
Start: 2017-09-26 — End: 2017-09-26

## 2017-09-26 NOTE — Telephone Encounter (Signed)
Error

## 2017-09-26 NOTE — Telephone Encounter (Signed)
Is this ok to refill?  

## 2017-09-26 NOTE — Progress Notes (Signed)
Subjective Chief Complaint  Patient presents with  . Hospitalization Follow-up    hospital follow up    Here for hospital follow up.   Accompanied by wife.    Admit date: 09/09/17 Discharge date: 09/14/17  he was admitted for asthma exacerbation in COPD, ongoing tobacco use, acute encephalopathy, CAP, dehydration, with active problems of diabetes, OSA, HTN, bipolar.  New medications started per hospitalization include Albuterol, Dulera, Spiriva, Nicoderm, prednisone short term, pepcid, naprosyn, miralax.  He was also continued on Xopenex nebulized for home use.     I saw him here on 09/09/17 for cough, wheezing and altered mental status, and we sent him to the hospital.    He was hospitalized for left lobar pneumonia, encephalopathy, respiratory failure with hypoxia, was on Levaquin initially, but sputum culture showed MRSA PCR +, steroids were added as well as nebulizer treatment.   He slowly recovered.  His chronic issues were stable.  Of note, he hasn't tolerated CPAP for years.  He notes issues with swallowing and he continues to be chronically hoarse. Hasn't seen Dr.Teoh in a while.  Of note, he had VATS surgery 10/2016 for pneumonia, had septic shock, respiratory failure with that hospitalization.    He currently reports improvement.   He has 3 more steroids left.  breathing better.  He was not discharged on antibiotics.   He doesn't have appt yet.  Plans to see Dr. Luan Pulling pulmonology in [place].    Still deals with chronic neck and back pain.  He quit going to pain clinic . Felt like they just wanted to keep giving him injections that weren't helping and he was taking too many pain pills.   No other new c/o  Past Medical History:  Diagnosis Date  . Anxiety   . Back pain, chronic    mid- and lower back  . Bipolar disorder ([place])   . COPD (chronic obstructive pulmonary disease) ([place])   . Depression   . Diet-controlled diabetes mellitus ([place])   . History of DVT (deep vein  thrombosis) 07/2010   right leg  . History of MRSA infection 2013   thigh  . Hyperlipidemia    no current med.  . Hypertension    no current med.  . Lung collapse   . Osteoarthritis   . Sleep apnea    doesn't tolerate CPAP   Current Outpatient Prescriptions on File Prior to Visit  Medication Sig Dispense Refill  . albuterol (PROVENTIL HFA;VENTOLIN HFA) 108 (90 Base) MCG/ACT inhaler Inhale 2 puffs into the lungs every 6 (six) hours as needed for wheezing or shortness of breath. 1 Inhaler 2  . alprazolam (XANAX) 2 MG tablet Take 1 tablet (2 mg total) by mouth 3 (three) times daily as needed for anxiety. 20 tablet 0  . amphetamine-dextroamphetamine (ADDERALL) 30 MG tablet TAKE 2 AND 1/2 TABLETS BY MOUTH ONCE DAILY IN THE MORNING  0  . levalbuterol (XOPENEX) 0.63 MG/3ML nebulizer solution USE 1 VIAL(3 ML) IN NEBULIZER EVERY 6 HOURS AS NEEDED FOR WHEEZING OR SHORTNESS OF BREATH 300 mL 0  . LYRICA 150 MG capsule TAKE ONE CAPSULE BY MOUTH TWICE DAILY  2  . pravastatin (PRAVACHOL) 20 MG tablet Take 1 tablet (20 mg total) by mouth daily. 90 tablet 3  . predniSONE (DELTASONE) 10 MG tablet Take 50mg  daily for 3days,Take 40mg  daily for 3days,Take 30mg  daily for 3days,Take 20mg  daily for 3days,Take 10mg  daily for 3days, then stop. 45 tablet 0  . tiotropium (SPIRIVA HANDIHALER) 18 MCG inhalation  capsule Place 1 capsule (18 mcg total) into inhaler and inhale daily. 30 capsule 2  . polyethylene glycol (MIRALAX / GLYCOLAX) packet Take 17 g by mouth daily. (Patient not taking: Reported on 09/26/2017) 14 each 0   No current facility-administered medications on file prior to visit.    ROS as in subjective    Objective: BP 118/68   Pulse (!) 110   Wt 289 lb (131.1 kg)   SpO2 95%   BMI 36.12 kg/m   Wt Readings from Last 3 Encounters:  09/26/17 289 lb (131.1 kg)  09/14/17 285 lb 11.2 oz (129.6 kg)  07/02/17 280 lb (127 kg)    General appearance: alert, no distress, WD/WN Psych: pleasant,  answers questions appropriately, good eye contact HEENT: normocephalic, sclerae anicteric, PERRLA, EOMi, nares patent, no discharge or erythema, pharynx normal Oral cavity: MMM, small airway, audible breathing as usual, no lesions Neck: supple, no lymphadenopathy, no thyromegaly, no masses, no bruits, no JVD Heart: RRR, normal S1, S2, no murmurs Lungs: CTA bilaterally, no wheezes, rhonchi, or rales Extremities: no edema, no cyanosis, no clubbing Pulses: 2+ symmetric, upper and lower extremities, normal cap refill Neurological: alert, oriented x 3, CN2-12 intact    Assessment: Encounter Diagnoses  Name Primary?  Marland Kitchen Asthma exacerbation in COPD ([place]) Yes  . Community acquired pneumonia of left lung, unspecified part of lung   . OSA (obstructive sleep apnea)   . Secondary hypertension   . Bipolar affective disorder, current episode hypomanic ([place])   . Diet-controlled diabetes mellitus ([place])   . Hoarseness   . Tobacco use disorder   . Need for pneumococcal vaccination       Plan: Reviewed 09/14/17 discharge summary, labs, medications changes, lab results, imaging results.    Asthma exacerbation in COPD, smoker, hoarseness - he has f/u scheduled with pulmonology.  For now continue the new medications started by hospital, including Dulera, Spiriva, pepcid, Nicoderm.     CAP - seems to be resolved  advised he stop smoking!  OSA - doesn't tolerate CPAP so far.  Has tried several times prior.  Referral back to ENT for hoarseness, dysphagia  bipolar - f/u with psychiatry  Diabetes - diet controlled still for now.  reviewed recent labs.   Advised home glucose monitoring.  F/u 96mo fasting.  HTN - controlled off medication at this point.  Continue to monitor BP at home.  Dysphagia, hoarseness - referral back to Dr. Benjamine Mola, ENT  Recommendations:  We will refer you to Dr. Hawkins/pulmonology in [place]  We will refer you back to Dr. Benjamine Mola, ENT for hoarseness, trouble swallowing,  failure to tolerate CPAP  Check your sugars periodically such as 2 days per week fasting to make sure fasting glucose is staying under 130  Check you blood pressure periodically to make sure pressures are staying around 130/80 or less  Continue the new medications started at this hospitalization:  Spiriva inhaler 1 inhalation daily  Dulera 2 puffs twice daily.  Rinse mouth out with water after use  Pepcid 1 tablet daily for reflux Naprosyn as needed for arthritis  Finish the prednisone tablets  If possible use Nicotine patch, gum or lozenge over the counter to help stop tobacco  Continue your other routine medications  Check with your pharmacy or with Dr. Luan Pulling office about doing Prevnar 13 vaccine.  Apparently your insurance doesn't cover this vaccine to be given here at our office.   Follow up with your psychiatrist.   Ask them about hypnotism or  other therapies to quit tobacco  Lets see you back here in 3 months fasting regarding diabetes, cholesterol, blood pressure.  Ariv was seen today for hospitalization follow-up.  Diagnoses and all orders for this visit:  Asthma exacerbation in COPD [facility])  Community acquired pneumonia of left lung, unspecified part of lung  OSA (obstructive sleep apnea)  Secondary hypertension  Bipolar affective disorder, current episode hypomanic ([place])  Diet-controlled diabetes mellitus ([place])  Hoarseness  Tobacco use disorder  Need for pneumococcal vaccination  Other orders -     nicotine (NICODERM CQ - DOSED IN MG/24 HOURS) 14 mg/24hr patch; Place 1 patch (14 mg total) onto the skin daily. -     famotidine (PEPCID) 20 MG tablet; Take 1 tablet (20 mg total) by mouth daily. -     mometasone-formoterol (DULERA) 200-5 MCG/ACT AERO; Inhale 2 puffs into the lungs 2 (two) times daily. -     naproxen (NAPROSYN) 500 MG tablet; Take 1 tablet (500 mg total) by mouth 2 (two) times daily with a meal.

## 2017-09-26 NOTE — Patient Instructions (Signed)
Recommendations:  We will refer you to Dr. Hawkins/pulmonology in [place]  We will refer you back to Dr. Benjamine Mola, ENT for hoarseness, trouble swallowing, failure to tolerate CPAP  Check your sugars periodically such as 2 days per week fasting to make sure fasting glucose is staying under 130  Check you blood pressure periodically to make sure pressures are staying around 130/80 or less  Continue the new medications started at this hospitalization:  Spiriva inhaler 1 inhalation daily  Dulera 2 puffs twice daily.  Rinse mouth out with water after use  Pepcid 1 tablet daily for reflux Naprosyn as needed for arthritis  Finish the prednisone tablets  If possible use Nicotine patch, gum or lozenge over the counter to help stop tobacco  Continue your other routine medications  Check with your pharmacy or with Dr. Luan Pulling office about doing Prevnar 13 vaccine.  Apparently your insurance doesn't cover this vaccine to be given here at our office.   Follow up with your psychiatrist.   Ask them about hypnotism or other therapies to quit tobacco  Lets see you back here in 3 months fasting regarding diabetes, cholesterol, blood pressure.

## 2017-09-30 ENCOUNTER — Ambulatory Visit (INDEPENDENT_AMBULATORY_CARE_PROVIDER_SITE_OTHER): Payer: Medicare Other | Admitting: Otolaryngology

## 2017-09-30 DIAGNOSIS — R490 Dysphonia: Secondary | ICD-10-CM

## 2017-09-30 DIAGNOSIS — K219 Gastro-esophageal reflux disease without esophagitis: Secondary | ICD-10-CM

## 2017-10-06 ENCOUNTER — Telehealth: Payer: Self-pay | Admitting: Medical

## 2017-10-06 NOTE — Telephone Encounter (Signed)
P.A. NICOTINE PATCH

## 2017-10-19 NOTE — Telephone Encounter (Signed)
P.A. Denied, OTC or non prescription drugs are excluded from Medicare coverage

## 2017-10-21 NOTE — Telephone Encounter (Signed)
pls let them know

## 2017-10-22 NOTE — Telephone Encounter (Signed)
Tried Home # and disconnected, tried cell # unable to leave message mailbox full

## 2017-10-24 ENCOUNTER — Other Ambulatory Visit: Payer: Self-pay | Admitting: Medical

## 2017-10-28 NOTE — Telephone Encounter (Signed)
Can pt have a refill 

## 2017-11-18 ENCOUNTER — Ambulatory Visit (HOSPITAL_COMMUNITY)
Admission: RE | Admit: 2017-11-18 | Discharge: 2017-11-18 | Disposition: A | Discharge status: Home / Self Care | Payer: Medicare Other | Source: Ambulatory Visit | Attending: Pulmonary Disease | Admitting: Pulmonary Disease

## 2017-11-18 ENCOUNTER — Other Ambulatory Visit (HOSPITAL_COMMUNITY): Payer: Self-pay | Admitting: Pulmonary Disease

## 2017-11-18 DIAGNOSIS — J189 Pneumonia, unspecified organism: Secondary | ICD-10-CM | POA: Diagnosis not present

## 2017-11-18 DIAGNOSIS — G4733 Obstructive sleep apnea (adult) (pediatric): Secondary | ICD-10-CM | POA: Diagnosis not present

## 2017-11-18 DIAGNOSIS — J984 Other disorders of lung: Secondary | ICD-10-CM | POA: Diagnosis not present

## 2017-11-18 DIAGNOSIS — R0602 Shortness of breath: Secondary | ICD-10-CM | POA: Diagnosis not present

## 2017-11-18 DIAGNOSIS — J449 Chronic obstructive pulmonary disease, unspecified: Secondary | ICD-10-CM | POA: Diagnosis not present

## 2017-11-19 ENCOUNTER — Other Ambulatory Visit: Payer: Self-pay | Admitting: Medical

## 2017-11-19 NOTE — Telephone Encounter (Signed)
Is this ok to refill?  

## 2017-11-27 ENCOUNTER — Other Ambulatory Visit (HOSPITAL_COMMUNITY): Payer: Self-pay | Admitting: Respiratory Therapy

## 2017-11-27 DIAGNOSIS — R0602 Shortness of breath: Secondary | ICD-10-CM

## 2017-12-06 DIAGNOSIS — F410 Panic disorder [episodic paroxysmal anxiety] without agoraphobia: Secondary | ICD-10-CM | POA: Diagnosis not present

## 2017-12-06 DIAGNOSIS — Z79899 Other long term (current) drug therapy: Secondary | ICD-10-CM | POA: Diagnosis not present

## 2017-12-21 ENCOUNTER — Other Ambulatory Visit: Payer: Self-pay | Admitting: Medical

## 2017-12-26 ENCOUNTER — Ambulatory Visit: Payer: Medicare Other | Admitting: Medical

## 2018-01-22 ENCOUNTER — Other Ambulatory Visit: Payer: Self-pay | Admitting: Medical

## 2018-02-19 ENCOUNTER — Other Ambulatory Visit: Payer: Self-pay | Admitting: Medical

## 2018-02-19 NOTE — Telephone Encounter (Signed)
LOV- 09/26/17 Okay to refill?  Thanks!

## 2018-02-28 DIAGNOSIS — F410 Panic disorder [episodic paroxysmal anxiety] without agoraphobia: Secondary | ICD-10-CM | POA: Diagnosis not present

## 2018-02-28 DIAGNOSIS — Z79899 Other long term (current) drug therapy: Secondary | ICD-10-CM | POA: Diagnosis not present

## 2018-05-30 DIAGNOSIS — F250 Schizoaffective disorder, bipolar type: Secondary | ICD-10-CM | POA: Diagnosis not present

## 2018-05-30 DIAGNOSIS — Z79899 Other long term (current) drug therapy: Secondary | ICD-10-CM | POA: Diagnosis not present

## 2018-06-24 ENCOUNTER — Other Ambulatory Visit: Payer: Self-pay | Admitting: Medical

## 2018-06-24 NOTE — Telephone Encounter (Signed)
Is this ok to refill?  

## 2018-06-24 NOTE — Telephone Encounter (Signed)
Approve and get in for med check visit

## 2018-06-25 ENCOUNTER — Telehealth: Payer: Self-pay | Admitting: Family Medicine

## 2018-06-25 NOTE — Telephone Encounter (Signed)
Called pt phone #. Lmtrc, needs diabetic appt and A1C

## 2018-06-25 NOTE — Telephone Encounter (Signed)
Tried to call patient no answer and no voicemail.  I will try again later today.

## 2018-07-11 ENCOUNTER — Telehealth: Payer: Self-pay

## 2018-07-11 NOTE — Telephone Encounter (Signed)
Left message on voicemail for patient to call back and verify which appointment he want to for 07-14-18.

## 2018-07-14 ENCOUNTER — Ambulatory Visit (INDEPENDENT_AMBULATORY_CARE_PROVIDER_SITE_OTHER): Payer: Medicare Other | Admitting: Medical

## 2018-07-14 ENCOUNTER — Encounter: Payer: Self-pay | Admitting: Medical

## 2018-07-14 ENCOUNTER — Ambulatory Visit: Payer: Medicare Other | Admitting: Medical

## 2018-07-14 ENCOUNTER — Other Ambulatory Visit: Payer: Self-pay | Admitting: Medical

## 2018-07-14 VITALS — BP 136/88 | HR 93 | Temp 98.4°F | Ht 74.25 in | Wt 277.0 lb

## 2018-07-14 DIAGNOSIS — M545 Low back pain: Secondary | ICD-10-CM | POA: Diagnosis not present

## 2018-07-14 DIAGNOSIS — N529 Male erectile dysfunction, unspecified: Secondary | ICD-10-CM | POA: Diagnosis not present

## 2018-07-14 DIAGNOSIS — J45901 Unspecified asthma with (acute) exacerbation: Secondary | ICD-10-CM

## 2018-07-14 DIAGNOSIS — E118 Type 2 diabetes mellitus with unspecified complications: Secondary | ICD-10-CM | POA: Diagnosis not present

## 2018-07-14 DIAGNOSIS — R05 Cough: Secondary | ICD-10-CM

## 2018-07-14 DIAGNOSIS — E785 Hyperlipidemia, unspecified: Secondary | ICD-10-CM | POA: Diagnosis not present

## 2018-07-14 DIAGNOSIS — R7989 Other specified abnormal findings of blood chemistry: Secondary | ICD-10-CM | POA: Insufficient documentation

## 2018-07-14 DIAGNOSIS — F17200 Nicotine dependence, unspecified, uncomplicated: Secondary | ICD-10-CM

## 2018-07-14 DIAGNOSIS — F319 Bipolar disorder, unspecified: Secondary | ICD-10-CM | POA: Diagnosis not present

## 2018-07-14 DIAGNOSIS — I700 Atherosclerosis of aorta: Secondary | ICD-10-CM

## 2018-07-14 DIAGNOSIS — G8929 Other chronic pain: Secondary | ICD-10-CM

## 2018-07-14 DIAGNOSIS — J441 Chronic obstructive pulmonary disease with (acute) exacerbation: Secondary | ICD-10-CM | POA: Diagnosis not present

## 2018-07-14 DIAGNOSIS — G4733 Obstructive sleep apnea (adult) (pediatric): Secondary | ICD-10-CM | POA: Diagnosis not present

## 2018-07-14 DIAGNOSIS — J0110 Acute frontal sinusitis, unspecified: Secondary | ICD-10-CM | POA: Diagnosis not present

## 2018-07-14 DIAGNOSIS — R059 Cough, unspecified: Secondary | ICD-10-CM

## 2018-07-14 DIAGNOSIS — I159 Secondary hypertension, unspecified: Secondary | ICD-10-CM | POA: Diagnosis not present

## 2018-07-14 MED ORDER — LEVALBUTEROL HCL 0.63 MG/3ML IN NEBU: INHALATION_SOLUTION | RESPIRATORY_TRACT | 0 refills | Status: DC

## 2018-07-14 MED ORDER — MOMETASONE FURO-FORMOTEROL FUM 200-5 MCG/ACT IN AERO: 2.0000 | INHALATION_SPRAY | Freq: Two times a day (BID) | RESPIRATORY_TRACT | 11 refills | Status: DC

## 2018-07-14 MED ORDER — SILDENAFIL CITRATE 100 MG PO TABS: 50.0000 mg | ORAL_TABLET | Freq: Every day | ORAL | 2 refills | Status: DC | PRN

## 2018-07-14 MED ORDER — ALBUTEROL SULFATE HFA 108 (90 BASE) MCG/ACT IN AERS: 2.0000 | INHALATION_SPRAY | Freq: Four times a day (QID) | RESPIRATORY_TRACT | 2 refills | Status: DC | PRN

## 2018-07-14 MED ORDER — PRAVASTATIN SODIUM 20 MG PO TABS: 20.0000 mg | ORAL_TABLET | Freq: Every day | ORAL | 3 refills | Status: DC

## 2018-07-14 MED ORDER — TIOTROPIUM BROMIDE MONOHYDRATE 18 MCG IN CAPS: 18.0000 ug | ORAL_CAPSULE | Freq: Every day | RESPIRATORY_TRACT | 3 refills | Status: AC

## 2018-07-14 MED ORDER — FAMOTIDINE 20 MG PO TABS: ORAL_TABLET | ORAL | 3 refills | Status: AC

## 2018-07-14 MED ORDER — AZITHROMYCIN 250 MG PO TABS: ORAL_TABLET | ORAL | 0 refills | Status: DC

## 2018-07-14 NOTE — Progress Notes (Signed)
Subjective: Chief Complaint  Patient presents with  . Medication Management   Here for medication follow-up.  Last visit October 2018.  He has not been in for routine follow-up as desired.  He has a history of diabetes, bipolar disorder, sleep apnea, asthma, tobacco use, chronic back pain, high risk medication use, sees multiple providers.  He is also been known to frequent emergency department.  Diabetes - hasn't been checking sugars, maybe some polyuria, but no polydipsia.    Checking feet regularly, no foot lesions.   Sees eye doctor yearly, within last year.    [place] out of [place].  Still smokes  Asthma -  Has its moments.  Not currently using Spiriva, but still Dulera, 2 puffs BID.   Has breathing spells, once to twice weekly.    Having a lot of problems with erections.   Sometimes can get partial erections.   No prior Viagra or similar medication.   He was on testosterone compounded prescription cream at one point.   It seemed to help.   Using CPAP some, but feels like it suffocates him at times.   Thinks CPAP supplier is [place]  Has 2 week hx/o sinus pressure, head congestion, sinus infection symptoms, some cough.     Past Medical History:  Diagnosis Date  . Anxiety   . Back pain, chronic    mid- and lower back  . Bipolar disorder ([place])   . COPD (chronic obstructive pulmonary disease) ([place])   . Depression   . Diet-controlled diabetes mellitus ([place])   . History of DVT (deep vein thrombosis) 07/2010   right leg  . History of MRSA infection 2013   thigh  . Hyperlipidemia    no current med.  . Hypertension    no current med.  . Lung collapse   . Osteoarthritis   . Sleep apnea    doesn't tolerate CPAP   Current Outpatient Medications on File Prior to Visit  Medication Sig Dispense Refill  . alprazolam (XANAX) 2 MG tablet Take 1 tablet (2 mg total) by mouth 3 (three) times daily as needed for anxiety. 20 tablet 0  .  amphetamine-dextroamphetamine (ADDERALL) 30 MG tablet TAKE 2 AND 1/2 TABLETS BY MOUTH ONCE DAILY IN THE MORNING  0  . DULERA 200-5 MCG/ACT AERO INHALE 2 PUFFS INTO THE LUNGS TWICE DAILY 13 g 0  . LYRICA 150 MG capsule TAKE ONE CAPSULE BY MOUTH TWICE DAILY  2  . pravastatin (PRAVACHOL) 20 MG tablet Take 1 tablet (20 mg total) by mouth daily. 90 tablet 3  . predniSONE (DELTASONE) 10 MG tablet Take 50mg  daily for 3days,Take 40mg  daily for 3days,Take 30mg  daily for 3days,Take 20mg  daily for 3days,Take 10mg  daily for 3days, then stop. 45 tablet 0  . tiotropium (SPIRIVA HANDIHALER) 18 MCG inhalation capsule Place 1 capsule (18 mcg total) into inhaler and inhale daily. 30 capsule 2  . famotidine (PEPCID) 20 MG tablet TAKE 1 TABLET(20 MG) BY MOUTH DAILY (Patient not taking: Reported on 07/14/2018) 90 tablet 1  . levalbuterol (XOPENEX) 0.63 MG/3ML nebulizer solution USE 1 VIAL(3 ML) IN NEBULIZER EVERY 6 HOURS AS NEEDED FOR WHEEZING OR SHORTNESS OF BREATH (Patient not taking: Reported on 07/14/2018) 300 mL 0  . polyethylene glycol (MIRALAX / GLYCOLAX) packet Take 17 g by mouth daily. (Patient not taking: Reported on 09/26/2017) 14 each 0   No current facility-administered medications on file prior to visit.    ROS as in subjective  Objective: BP 136/88   Pulse 93   Temp 98.4 F (36.9 C) (Oral)   Ht 6' 2.25" (1.886 m)   Wt 277 lb (125.6 kg)   SpO2 95%   BMI 35.33 kg/m   Wt Readings from Last 3 Encounters:  07/14/18 277 lb (125.6 kg)  09/26/17 289 lb (131.1 kg)  09/14/17 285 lb 11.2 oz (129.6 kg)   BP Readings from Last 3 Encounters:  07/14/18 136/88  09/26/17 118/68  09/14/17 (!) 146/98      General appearance: alert, no distress, WD/WN,  HEENT: normocephalic, sclerae anicteric, PERRLA, EOMi, nares patent, mild mucoid discharge, mild erythema, pharynx normal Oral cavity: MMM, no lesions Neck: supple, no lymphadenopathy, no thyromegaly, no masses, no bruits Heart: RRR, normal S1, S2,  no murmurs Lungs: CTA bilaterally, no wheezes, rhonchi, or rales Extremities: no edema, no cyanosis, no clubbing Pulses: 2+ symmetric, upper and lower extremities, normal cap refill Neurological: alert, oriented x 3, CN2-12 intact, non focal exam Psychiatric: normal affect, behavior normal, pleasant     Assessment: Encounter Diagnoses  Name Primary?  . Diabetes mellitus with complication ([place]) Yes  . OSA (obstructive sleep apnea)   . Asthma exacerbation in COPD ([place])   . Secondary hypertension   . Erectile dysfunction, unspecified erectile dysfunction type   . Bipolar affective disorder, remission status unspecified ([place])   . Chronic low back pain, unspecified back pain laterality, with sciatica presence unspecified   . Cough   . Hyperlipidemia, unspecified hyperlipidemia type   . Tobacco use disorder   . Low testosterone   . Acute frontal sinusitis, recurrence not specified   . Aortic atherosclerosis ([place])       Plan: Diabetes- discussed routine follow-up, medication compliance, glucose monitoring, daily foot checks, yearly visits, routine labs today  Asthma-advised smoking cessation.   refills on medication, restart Spiriva  Tobacco use-counseled on cessation.  [place] Flax is up-to-date on Tdap, pneumococcal 23, flu shot.  CT chest October 2018 showed aortic atherosclerosis- discussed importance of healthy diet and statin compliance.  Lipid panel today  Cardiac-of note echocardiogram 5427 showed systolic function normal, ejection fraction 60 to 65%  Bipolar disorder-managed by Dr. Junie Bame  Chronic back pain- see specialist  Sinusitis - begin round of zpak, can use OTC mucinex DM, rest, hydrate well, f/u if not resolving within a week  Erectile Dysfunction - Reviewed pathophysiology and differential diagnosis of erectile dysfunction with the patient.  Discussed treatment options.  Begin trial of Viagra.  Discussed potential risks of medications including hypotension  and priapism.  Discussed proper use of medication.  Questions were answered.  Recheck with call report 1-2 weeks  OSA- advised he contact [facility] about mask adjustment  Low T - repeat lab today, consider restarting compounded cream  [place] was seen today for medication management.  Diagnoses and all orders for this visit:  Diabetes mellitus with complication ([place]) -     Comprehensive metabolic panel -     CBC with Differential/Platelet -     HM DIABETES EYE EXAM -     HM DIABETES FOOT EXAM -     Hemoglobin A1c -     Microalbumin / creatinine urine ratio  OSA (obstructive sleep apnea)  Asthma exacerbation in COPD ([facility])  Secondary hypertension  Erectile dysfunction, unspecified erectile dysfunction type  Bipolar affective disorder, remission status unspecified ([facility])  Chronic low back pain, unspecified back pain laterality, with sciatica presence unspecified  Cough  Hyperlipidemia, unspecified hyperlipidemia type -  Lipid panel  Tobacco use disorder  Low testosterone -     Testosterone  Acute frontal sinusitis, recurrence not specified  Aortic atherosclerosis ([facility]) -     Comprehensive metabolic panel -     CBC with Differential/Platelet -     Lipid panel

## 2018-07-15 ENCOUNTER — Other Ambulatory Visit: Payer: Self-pay | Admitting: Medical

## 2018-07-15 LAB — CBC WITH DIFFERENTIAL/PLATELET
BASOS ABS: 0 10*3/uL (ref 0.0–0.2)
Basos: 0 %
EOS (ABSOLUTE): 0.5 10*3/uL — ABNORMAL HIGH (ref 0.0–0.4)
Eos: 5 %
HEMOGLOBIN: 15.7 g/dL (ref 13.0–17.7)
Hematocrit: 45.4 % (ref 37.5–51.0)
IMMATURE GRANS (ABS): 0 10*3/uL (ref 0.0–0.1)
IMMATURE GRANULOCYTES: 0 %
LYMPHS: 20 %
Lymphocytes Absolute: 2 10*3/uL (ref 0.7–3.1)
MCH: 30.8 pg (ref 26.6–33.0)
MCHC: 34.6 g/dL (ref 31.5–35.7)
MCV: 89 fL (ref 79–97)
MONOCYTES: 7 %
Monocytes Absolute: 0.7 10*3/uL (ref 0.1–0.9)
Neutrophils Absolute: 6.9 10*3/uL (ref 1.4–7.0)
Neutrophils: 68 %
Platelets: 348 10*3/uL (ref 150–450)
RBC: 5.1 x10E6/uL (ref 4.14–5.80)
RDW: 13.6 % (ref 12.3–15.4)
WBC: 10.2 10*3/uL (ref 3.4–10.8)

## 2018-07-15 LAB — LIPID PANEL
CHOLESTEROL TOTAL: 217 mg/dL — AB (ref 100–199)
Chol/HDL Ratio: 4.8 ratio (ref 0.0–5.0)
HDL: 45 mg/dL (ref >39)
LDL Calculated: 141 mg/dL — ABNORMAL HIGH (ref 0–99)
TRIGLYCERIDES: 157 mg/dL — AB (ref 0–149)
VLDL Cholesterol Cal: 31 mg/dL (ref 5–40)

## 2018-07-15 LAB — HEMOGLOBIN A1C
ESTIMATED AVERAGE GLUCOSE: 117 mg/dL
HEMOGLOBIN A1C: 5.7 % — AB (ref 4.8–5.6)

## 2018-07-15 LAB — COMPREHENSIVE METABOLIC PANEL
ALK PHOS: 96 IU/L (ref 39–117)
ALT: 25 IU/L (ref 0–44)
AST: 20 IU/L (ref 0–40)
Albumin/Globulin Ratio: 1.5 (ref 1.2–2.2)
Albumin: 3.9 g/dL (ref 3.5–5.5)
BILIRUBIN TOTAL: 0.3 mg/dL (ref 0.0–1.2)
BUN/Creatinine Ratio: 10 (ref 9–20)
BUN: 7 mg/dL (ref 6–24)
CHLORIDE: 103 mmol/L (ref 96–106)
CO2: 22 mmol/L (ref 20–29)
Calcium: 9.2 mg/dL (ref 8.7–10.2)
Creatinine, Ser: 0.68 mg/dL — ABNORMAL LOW (ref 0.76–1.27)
GFR calc non Af Amer: 118 mL/min/{1.73_m2} (ref >59)
GFR, EST AFRICAN AMERICAN: 136 mL/min/{1.73_m2} (ref >59)
GLUCOSE: 135 mg/dL — AB (ref 65–99)
Globulin, Total: 2.6 g/dL (ref 1.5–4.5)
Potassium: 4.3 mmol/L (ref 3.5–5.2)
Sodium: 139 mmol/L (ref 134–144)
TOTAL PROTEIN: 6.5 g/dL (ref 6.0–8.5)

## 2018-07-15 LAB — MICROALBUMIN / CREATININE URINE RATIO
Creatinine, Urine: 117 mg/dL
Microalb/Creat Ratio: <2.6 mg/g creat (ref 0.0–30.0)

## 2018-07-15 LAB — TESTOSTERONE: TESTOSTERONE: 326 ng/dL (ref 264–916)

## 2018-07-15 MED ORDER — TESTOSTERONE 20 % CREA: 2.0000 "application " | TOPICAL_CREAM | Freq: Every day | 2 refills | Status: AC

## 2018-07-28 ENCOUNTER — Telehealth: Payer: Self-pay | Admitting: Medical

## 2018-07-28 NOTE — Telephone Encounter (Signed)
P.A. SILDENAFIL  

## 2018-08-01 DIAGNOSIS — Z79899 Other long term (current) drug therapy: Secondary | ICD-10-CM | POA: Diagnosis not present

## 2018-08-01 DIAGNOSIS — F250 Schizoaffective disorder, bipolar type: Secondary | ICD-10-CM | POA: Diagnosis not present

## 2018-08-05 NOTE — Telephone Encounter (Signed)
P.A. Denied, plan exclusion, no meds in this case are covered, called wife and informed.  Gave pt option of [facility] and she will call around local pharmacies and check cash prices.

## 2018-08-29 DIAGNOSIS — F250 Schizoaffective disorder, bipolar type: Secondary | ICD-10-CM | POA: Diagnosis not present

## 2018-08-29 DIAGNOSIS — Z79899 Other long term (current) drug therapy: Secondary | ICD-10-CM | POA: Diagnosis not present

## 2018-09-01 ENCOUNTER — Emergency Department (HOSPITAL_COMMUNITY)
Admission: EM | Admit: 2018-09-01 | Discharge: 2018-09-01 | Disposition: A | Discharge status: Home / Self Care | Payer: Medicare Other | Attending: Emergency Medicine | Admitting: Emergency Medicine

## 2018-09-01 ENCOUNTER — Emergency Department (HOSPITAL_COMMUNITY): Payer: Medicare Other

## 2018-09-01 ENCOUNTER — Encounter (HOSPITAL_COMMUNITY): Payer: Self-pay | Admitting: Emergency Medicine

## 2018-09-01 ENCOUNTER — Other Ambulatory Visit: Payer: Self-pay

## 2018-09-01 DIAGNOSIS — M5432 Sciatica, left side: Secondary | ICD-10-CM

## 2018-09-01 DIAGNOSIS — G8929 Other chronic pain: Secondary | ICD-10-CM | POA: Insufficient documentation

## 2018-09-01 DIAGNOSIS — Y999 Unspecified external cause status: Secondary | ICD-10-CM | POA: Insufficient documentation

## 2018-09-01 DIAGNOSIS — E119 Type 2 diabetes mellitus without complications: Secondary | ICD-10-CM | POA: Insufficient documentation

## 2018-09-01 DIAGNOSIS — J449 Chronic obstructive pulmonary disease, unspecified: Secondary | ICD-10-CM | POA: Insufficient documentation

## 2018-09-01 DIAGNOSIS — Y9301 Activity, walking, marching and hiking: Secondary | ICD-10-CM | POA: Insufficient documentation

## 2018-09-01 DIAGNOSIS — F17210 Nicotine dependence, cigarettes, uncomplicated: Secondary | ICD-10-CM | POA: Diagnosis not present

## 2018-09-01 DIAGNOSIS — M5442 Lumbago with sciatica, left side: Secondary | ICD-10-CM | POA: Diagnosis not present

## 2018-09-01 DIAGNOSIS — M545 Low back pain: Secondary | ICD-10-CM | POA: Diagnosis not present

## 2018-09-01 DIAGNOSIS — S46911A Strain of unspecified muscle, fascia and tendon at shoulder and upper arm level, right arm, initial encounter: Secondary | ICD-10-CM | POA: Diagnosis not present

## 2018-09-01 DIAGNOSIS — W010XXA Fall on same level from slipping, tripping and stumbling without subsequent striking against object, initial encounter: Secondary | ICD-10-CM | POA: Diagnosis not present

## 2018-09-01 DIAGNOSIS — M25511 Pain in right shoulder: Secondary | ICD-10-CM | POA: Diagnosis not present

## 2018-09-01 DIAGNOSIS — Z79899 Other long term (current) drug therapy: Secondary | ICD-10-CM | POA: Insufficient documentation

## 2018-09-01 DIAGNOSIS — I10 Essential (primary) hypertension: Secondary | ICD-10-CM | POA: Insufficient documentation

## 2018-09-01 DIAGNOSIS — Y92009 Unspecified place in unspecified non-institutional (private) residence as the place of occurrence of the external cause: Secondary | ICD-10-CM | POA: Diagnosis not present

## 2018-09-01 DIAGNOSIS — S4991XA Unspecified injury of right shoulder and upper arm, initial encounter: Secondary | ICD-10-CM | POA: Diagnosis present

## 2018-09-01 LAB — URINALYSIS, ROUTINE W REFLEX MICROSCOPIC
Bilirubin Urine: NEGATIVE
GLUCOSE, UA: NEGATIVE mg/dL
HGB URINE DIPSTICK: NEGATIVE
Ketones, ur: NEGATIVE mg/dL
LEUKOCYTES UA: NEGATIVE
Nitrite: NEGATIVE
PROTEIN: NEGATIVE mg/dL
Specific Gravity, Urine: 1.018 (ref 1.005–1.030)
pH: 6 (ref 5.0–8.0)

## 2018-09-01 MED ORDER — OXYCODONE-ACETAMINOPHEN 5-325 MG PO TABS: 1.0000 | ORAL_TABLET | Freq: Four times a day (QID) | ORAL | 0 refills | Status: DC | PRN

## 2018-09-01 MED ORDER — PREDNISONE 50 MG PO TABS
60.0000 mg | ORAL_TABLET | Freq: Once | ORAL | Status: AC
  Administered 2018-09-01: 60 mg via ORAL
  Filled 2018-09-01 (×2): qty 1

## 2018-09-01 MED ORDER — OXYCODONE-ACETAMINOPHEN 5-325 MG PO TABS
1.0000 | ORAL_TABLET | Freq: Once | ORAL | Status: AC
  Administered 2018-09-01: 1 via ORAL
  Filled 2018-09-01: qty 1

## 2018-09-01 MED ORDER — PREDNISONE 10 MG PO TABS: ORAL_TABLET | ORAL | 0 refills | Status: DC

## 2018-09-01 NOTE — ED Notes (Signed)
Pt with right shoulder and lower back that radiates down left leg since yesterday.

## 2018-09-01 NOTE — ED Notes (Signed)
Pt verbalized understanding of no driving and to use caution within 4 hours of taking pain meds due to meds cause drowsiness 

## 2018-09-01 NOTE — ED Triage Notes (Signed)
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 Also states hurting to lower back and pain down left leg. Denies fall or known injury.

## 2018-09-01 NOTE — Discharge Instructions (Signed)
Your shoulder xray is ok and your MRI of your lumbar spine is negative for acute lumbar injury as the source of your pain and perceived foot weakness.  Take your next dose of prednisone tomorrow morning.    Do not drive within 4 hours of taking oxycodone as this will make you drowsy.  Avoid lifting,  Bending,  Twisting or any other activity that worsens your pain over the next week.  Apply an  icepack  to your lower back for 10-15 minutes every 2 hours for the next several days.  You may also add a heating pad for 20 minutes 3 times daily to your lower back.  You should get rechecked if your symptoms are not better over the next 5 days,  Or you develop increased pain,  Weakness in your leg(s) or loss of bladder or bowel function - these are symptoms of a worsening condition.  As discussed, your shoulder x-ray is negative for any obvious acute injuries but if you continue to have pain you may need further evaluation to rule out a rotator cuff injury.  Please discuss this with Dr. Aline Brochure if your symptoms persist.

## 2018-09-01 NOTE — ED Notes (Signed)
Pt returned from MRI °

## 2018-09-01 NOTE — ED Notes (Signed)
Patient transported to MRI 
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
## 2018-09-04 ENCOUNTER — Ambulatory Visit (INDEPENDENT_AMBULATORY_CARE_PROVIDER_SITE_OTHER): Payer: Medicare Other | Admitting: Orthopaedic Surgery

## 2018-09-04 ENCOUNTER — Encounter: Payer: Self-pay | Admitting: Orthopaedic Surgery

## 2018-09-04 DIAGNOSIS — M25511 Pain in right shoulder: Secondary | ICD-10-CM

## 2018-09-04 MED ORDER — OXYCODONE-ACETAMINOPHEN 5-325 MG PO TABS: 1.0000 | ORAL_TABLET | Freq: Four times a day (QID) | ORAL | 0 refills | Status: DC | PRN
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
## 2018-09-08 ENCOUNTER — Ambulatory Visit (HOSPITAL_COMMUNITY)
Admission: RE | Admit: 2018-09-08 | Discharge: 2018-09-08 | Disposition: A | Discharge status: Home / Self Care | Payer: Medicare Other | Source: Ambulatory Visit | Attending: Orthopaedic Surgery | Admitting: Orthopaedic Surgery

## 2018-09-08 DIAGNOSIS — M25511 Pain in right shoulder: Secondary | ICD-10-CM | POA: Diagnosis not present

## 2018-09-08 DIAGNOSIS — S46821A Laceration of other muscles, fascia and tendons at shoulder and upper arm level, right arm, initial encounter: Secondary | ICD-10-CM | POA: Diagnosis not present

## 2018-09-08 DIAGNOSIS — X58XXXA Exposure to other specified factors, initial encounter: Secondary | ICD-10-CM | POA: Diagnosis not present

## 2018-09-08 DIAGNOSIS — M25411 Effusion, right shoulder: Secondary | ICD-10-CM | POA: Diagnosis not present

## 2018-09-09 ENCOUNTER — Encounter: Payer: Self-pay | Admitting: Orthopaedic Surgery

## 2018-09-09 ENCOUNTER — Ambulatory Visit (INDEPENDENT_AMBULATORY_CARE_PROVIDER_SITE_OTHER): Payer: Medicare Other | Admitting: Orthopaedic Surgery

## 2018-09-09 VITALS — BP 175/90 | HR 88 | Ht 74.0 in | Wt 282.0 lb

## 2018-09-09 DIAGNOSIS — M25511 Pain in right shoulder: Secondary | ICD-10-CM

## 2018-09-09 MED ORDER — OXYCODONE-ACETAMINOPHEN 5-325 MG PO TABS: 1.0000 | ORAL_TABLET | Freq: Four times a day (QID) | ORAL | 0 refills | Status: DC | PRN
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
## 2018-09-09 NOTE — Patient Instructions (Addendum)
Steps to Quit Smoking Smoking tobacco can be bad for your health. It can also affect almost every organ in your body. Smoking puts you and people around you at risk for many serious long-lasting (chronic) diseases. Quitting smoking is hard, but it is one of the best things that you can do for your health. It is never too late to quit. What are the benefits of quitting smoking? When you quit smoking, you lower your risk for getting serious diseases and conditions. They can include:  Lung cancer or lung disease.  Heart disease.  Stroke.  Heart attack.  Not being able to have children (infertility).  Weak bones (osteoporosis) and broken bones (fractures).  If you have coughing, wheezing, and shortness of breath, those symptoms may get better when you quit. You may also get sick less often. If you are pregnant, quitting smoking can help to lower your chances of having a baby of low birth weight. What can I do to help me quit smoking? Talk with your doctor about what can help you quit smoking. Some things you can do (strategies) include:  Quitting smoking totally, instead of slowly cutting back how much you smoke over a period of time.  Going to in-person counseling. You are more likely to quit if you go to many counseling sessions.  Using resources and support systems, such as: ? Online chats with a counselor. ? Phone quitlines. ? Printed self-help materials. ? Support groups or group counseling. ? Text messaging programs. ? Mobile phone apps or applications.  Taking medicines. Some of these medicines may have nicotine in them. If you are pregnant or breastfeeding, do not take any medicines to quit smoking unless your doctor says it is okay. Talk with your doctor about counseling or other things that can help you.  Talk with your doctor about using more than one strategy at the same time, such as taking medicines while you are also going to in-person counseling. This can help make  quitting easier. What things can I do to make it easier to quit? Quitting smoking might feel very hard at first, but there is a lot that you can do to make it easier. Take these steps:  Talk to your family and friends. Ask them to support and encourage you.  Call phone quitlines, reach out to support groups, or work with a counselor.  Ask people who smoke to not smoke around you.  Avoid places that make you want (trigger) to smoke, such as: ? Bars. ? Parties. ? Smoke-break areas at work.  Spend time with people who do not smoke.  Lower the stress in your life. Stress can make you want to smoke. Try these things to help your stress: ? Getting regular exercise. ? Deep-breathing exercises. ? Yoga. ? Meditating. ? Doing a body scan. To do this, close your eyes, focus on one area of your body at a time from head to toe, and notice which parts of your body are tense. Try to relax the muscles in those areas.  Download or buy apps on your mobile phone or tablet that can help you stick to your quit plan. There are many free apps, such as QuitGuide from the CDC (Centers for Disease Control and Prevention). You can find more support from smokefree.gov and other websites.  This information is not intended to replace advice given to you by your health care provider. Make sure you discuss any questions you have with your health care provider. Document Released: 09/15/2009 Document   Revised: 07/17/2016 Document Reviewed: 04/05/2015 Elsevier Interactive Patient Education  2018 [employer].    Shoulder Exercises Ask your health care provider which exercises are safe for you. Do exercises exactly as told by your health care provider and adjust them as directed. It is normal to feel mild stretching, pulling, tightness, or discomfort as you do these exercises, but you should stop right away if you feel sudden pain or your pain gets worse.Do not begin these exercises until told by your health care  provider. RANGE OF MOTION EXERCISES These exercises warm up your muscles and joints and improve the movement and flexibility of your shoulder. These exercises also help to relieve pain, numbness, and tingling. These exercises involve stretching your injured shoulder directly. Exercise A: Pendulum  1. Stand near a wall or a surface that you can hold onto for balance. 2. Bend at the waist and let your left / right arm hang straight down. Use your other arm to support you. Keep your back straight and do not lock your knees. 3. Relax your left / right arm and shoulder muscles, and move your hips and your trunk so your left / right arm swings freely. Your arm should swing because of the motion of your body, not because you are using your arm or shoulder muscles. 4. Keep moving your body so your arm swings in the following directions, as told by your health care provider: ? Side to side. ? Forward and backward. ? In clockwise and counterclockwise circles. 5. Continue each motion for __________ seconds, or for as long as told by your health care provider. 6. Slowly return to the starting position. Repeat __________ times. Complete this exercise __________ times a day.

## 2018-09-12 ENCOUNTER — Other Ambulatory Visit: Payer: Medicare Other

## 2018-09-16 ENCOUNTER — Ambulatory Visit (INDEPENDENT_AMBULATORY_CARE_PROVIDER_SITE_OTHER): Payer: Medicare Other | Admitting: Orthopaedic Surgery

## 2018-09-16 ENCOUNTER — Encounter: Payer: Self-pay | Admitting: Orthopaedic Surgery

## 2018-09-16 VITALS — BP 136/81 | HR 97 | Ht 74.0 in | Wt 282.0 lb

## 2018-09-16 DIAGNOSIS — M25511 Pain in right shoulder: Secondary | ICD-10-CM | POA: Diagnosis not present

## 2018-09-16 MED ORDER — OXYCODONE-ACETAMINOPHEN 5-325 MG PO TABS: 1.0000 | ORAL_TABLET | Freq: Four times a day (QID) | ORAL | 0 refills | Status: AC | PRN

## 2018-09-16 NOTE — Patient Instructions (Addendum)
[place] [phone] Steps to Quit Smoking Smoking tobacco can be bad for your health. It can also affect almost every organ in your body. Smoking puts you and people around you at risk for many serious long-lasting (chronic) diseases. Quitting smoking is hard, but it is one of the best things that you can do for your health. It is never too late to quit. What are the benefits of quitting smoking? When you quit smoking, you lower your risk for getting serious diseases and conditions. They can include:  Lung cancer or lung disease.  Heart disease.  Stroke.  Heart attack.  Not being able to have children (infertility).  Weak bones (osteoporosis) and broken bones (fractures).  If you have coughing, wheezing, and shortness of breath, those symptoms may get better when you quit. You may also get sick less often. If you are pregnant, quitting smoking can help to lower your chances of having a baby of low birth weight. What can I do to help me quit smoking? Talk with your doctor about what can help you quit smoking. Some things you can do (strategies) include:  Quitting smoking totally, instead of slowly cutting back how much you smoke over a period of time.  Going to in-person counseling. You are more likely to quit if you go to many counseling sessions.  Using resources and support systems, such as: ? Database administrator with a Social worker. ? Phone quitlines. ? Careers information officer. ? Support groups or group counseling. ? Text messaging programs. ? Mobile phone apps or applications.  Taking medicines. Some of these medicines may have nicotine in them. If you are pregnant or breastfeeding, do not take any medicines to quit smoking unless your doctor says it is okay. Talk with your doctor about counseling or other things that can help you.  Talk with your doctor about using more than one strategy at the same time, such as taking medicines while you are also going to  in-person counseling. This can help make quitting easier. What things can I do to make it easier to quit? Quitting smoking might feel very hard at first, but there is a lot that you can do to make it easier. Take these steps:  Talk to your family and friends. Ask them to support and encourage you.  Call phone quitlines, reach out to support groups, or work with a Social worker.  Ask people who smoke to not smoke around you.  Avoid places that make you want (trigger) to smoke, such as: ? Bars. ? Parties. ? Smoke-break areas at work.  Spend time with people who do not smoke.  Lower the stress in your life. Stress can make you want to smoke. Try these things to help your stress: ? Getting regular exercise. ? Deep-breathing exercises. ? Yoga. ? Meditating. ? Doing a body scan. To do this, close your eyes, focus on one area of your body at a time from head to toe, and notice which parts of your body are tense. Try to relax the muscles in those areas.  Download or buy apps on your mobile phone or tablet that can help you stick to your quit plan. There are many free apps, such as QuitGuide from the [employer] Office manager for Disease Control and Prevention). You can find more support from smokefree.gov and other websites.  This information is not intended to replace advice given to you by your health care provider. Make sure you discuss any questions you have with your health  care provider. Document Released: 09/15/2009 Document Revised: 07/17/2016 Document Reviewed: 04/05/2015 Elsevier Interactive Patient Education  2018 [employer].
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
## 2018-09-17 ENCOUNTER — Other Ambulatory Visit: Payer: Self-pay

## 2018-09-17 ENCOUNTER — Ambulatory Visit (HOSPITAL_COMMUNITY): Payer: Medicare Other | Attending: Orthopaedic Surgery

## 2018-09-17 ENCOUNTER — Encounter (HOSPITAL_COMMUNITY): Payer: Self-pay

## 2018-09-17 DIAGNOSIS — M25611 Stiffness of right shoulder, not elsewhere classified: Secondary | ICD-10-CM | POA: Insufficient documentation

## 2018-09-17 DIAGNOSIS — R29898 Other symptoms and signs involving the musculoskeletal system: Secondary | ICD-10-CM | POA: Diagnosis not present

## 2018-09-17 DIAGNOSIS — M25511 Pain in right shoulder: Secondary | ICD-10-CM | POA: Insufficient documentation
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
## 2018-09-17 NOTE — Patient Instructions (Signed)
Complete the table slides 2-3 times a day. Each table complete for 1-2 minutes.     SHOULDER: Flexion On Table   Place hands on towel placed on table, elbows straight. Move hips away from body and lean forward with upper body, pushing towel away from body. Hold ___ seconds. ___ reps per set, ___ sets per day, ___ days per week  Abduction (Passive)   With arm out to side, resting on towel placed on table, keeping trunk away from table, lean to the side while pushing towel away from body. Hold ____ seconds. Repeat ____ times. Do ____ sessions per day.  Copyright  VHI. All rights reserved.     Internal Rotation (Assistive)   Seated with elbow bent at right angle and held against side, slide arm on table surface in an inward arc keeping elbow anchored in place. Repeat ____ times. Do ____ sessions per day. Activity: Use this motion to brush crumbs off the table.  Copyright  VHI. All rights reserved.

## 2018-09-23 ENCOUNTER — Ambulatory Visit (HOSPITAL_COMMUNITY): Payer: Medicare Other | Admitting: Occupational Therapy

## 2018-09-25 ENCOUNTER — Ambulatory Visit (HOSPITAL_COMMUNITY): Payer: Medicare Other

## 2018-09-25 ENCOUNTER — Telehealth (HOSPITAL_COMMUNITY): Payer: Self-pay

## 2018-09-26 ENCOUNTER — Other Ambulatory Visit: Payer: Self-pay | Admitting: Medical

## 2018-09-30 ENCOUNTER — Ambulatory Visit (INDEPENDENT_AMBULATORY_CARE_PROVIDER_SITE_OTHER): Payer: Medicare Other | Admitting: Orthopaedic Surgery

## 2018-09-30 ENCOUNTER — Encounter: Payer: Self-pay | Admitting: Orthopaedic Surgery

## 2018-09-30 ENCOUNTER — Telehealth (HOSPITAL_COMMUNITY): Payer: Self-pay | Admitting: Medical

## 2018-09-30 VITALS — BP 111/54 | HR 60 | Ht 74.0 in | Wt 278.0 lb

## 2018-09-30 DIAGNOSIS — M25511 Pain in right shoulder: Secondary | ICD-10-CM | POA: Diagnosis not present

## 2018-09-30 DIAGNOSIS — G8929 Other chronic pain: Secondary | ICD-10-CM

## 2018-09-30 MED ORDER — HYDROCODONE-ACETAMINOPHEN 5-325 MG PO TABS: ORAL_TABLET | ORAL | 0 refills | Status: DC

## 2018-09-30 NOTE — Telephone Encounter (Signed)
09/30/18  he said to cx all of his appts for now... he has no way to get here, his car is broke down but it will be about a month before he can come back and he will call back to rescehdule

## 2018-09-30 NOTE — Progress Notes (Signed)
PROCEDURE NOTE:  The patient request injection, verbal consent was obtained.  The right shoulder was prepped appropriately after time out was performed.   Sterile technique was observed and injection of 1 cc of Depo-Medrol 40 mg with several cc's of plain xylocaine. Anesthesia was provided by ethyl chloride and a 20-gauge needle was used to inject the shoulder area. A posterior approach was used.  The injection was tolerated well.  A band aid dressing was applied.  The patient was advised to apply ice later today and tomorrow to the injection sight as needed.  I have reviewed the [place] web site prior to prescribing narcotic medicine for this patient.  Return in one month.  He only went to OT once.  Electronically Signed Sanjuana Kava, MD 10/29/20199:11 AM

## 2018-10-01 ENCOUNTER — Ambulatory Visit (HOSPITAL_COMMUNITY): Payer: Medicare Other | Admitting: Occupational Therapy

## 2018-10-03 ENCOUNTER — Encounter (HOSPITAL_COMMUNITY): Payer: Medicare Other

## 2018-10-07 ENCOUNTER — Encounter (HOSPITAL_COMMUNITY): Payer: Medicare Other

## 2018-10-09 ENCOUNTER — Encounter (HOSPITAL_COMMUNITY): Payer: Medicare Other

## 2018-10-13 ENCOUNTER — Telehealth: Payer: Self-pay | Admitting: Orthopaedic Surgery

## 2018-10-13 NOTE — Telephone Encounter (Signed)
Patient called and requested refill on Hydrocodone/Acetaminophen 5-325 mgs.   Qty  28       Sig: One tablet every six hours for pain. Limit 7 days     Patient states he uses [facility] on [address].

## 2018-10-14 ENCOUNTER — Encounter: Payer: Self-pay | Admitting: Medical

## 2018-10-14 ENCOUNTER — Ambulatory Visit (INDEPENDENT_AMBULATORY_CARE_PROVIDER_SITE_OTHER): Payer: Medicare Other | Admitting: Medical

## 2018-10-14 VITALS — BP 126/70 | HR 86 | Temp 98.0°F | Resp 20 | Ht 74.0 in | Wt 284.4 lb

## 2018-10-14 DIAGNOSIS — E785 Hyperlipidemia, unspecified: Secondary | ICD-10-CM | POA: Diagnosis not present

## 2018-10-14 DIAGNOSIS — R202 Paresthesia of skin: Secondary | ICD-10-CM

## 2018-10-14 DIAGNOSIS — J309 Allergic rhinitis, unspecified: Secondary | ICD-10-CM

## 2018-10-14 DIAGNOSIS — Z5181 Encounter for therapeutic drug level monitoring: Secondary | ICD-10-CM

## 2018-10-14 DIAGNOSIS — G8929 Other chronic pain: Secondary | ICD-10-CM

## 2018-10-14 DIAGNOSIS — E118 Type 2 diabetes mellitus with unspecified complications: Secondary | ICD-10-CM

## 2018-10-14 DIAGNOSIS — I700 Atherosclerosis of aorta: Secondary | ICD-10-CM

## 2018-10-14 DIAGNOSIS — Z8619 Personal history of other infectious and parasitic diseases: Secondary | ICD-10-CM

## 2018-10-14 DIAGNOSIS — Z0000 Encounter for general adult medical examination without abnormal findings: Secondary | ICD-10-CM | POA: Diagnosis not present

## 2018-10-14 DIAGNOSIS — R7989 Other specified abnormal findings of blood chemistry: Secondary | ICD-10-CM

## 2018-10-14 DIAGNOSIS — R490 Dysphonia: Secondary | ICD-10-CM

## 2018-10-14 DIAGNOSIS — F310 Bipolar disorder, current episode hypomanic: Secondary | ICD-10-CM | POA: Diagnosis not present

## 2018-10-14 DIAGNOSIS — I159 Secondary hypertension, unspecified: Secondary | ICD-10-CM | POA: Diagnosis not present

## 2018-10-14 DIAGNOSIS — F319 Bipolar disorder, unspecified: Secondary | ICD-10-CM | POA: Diagnosis not present

## 2018-10-14 DIAGNOSIS — N529 Male erectile dysfunction, unspecified: Secondary | ICD-10-CM

## 2018-10-14 DIAGNOSIS — M545 Low back pain, unspecified: Secondary | ICD-10-CM

## 2018-10-14 DIAGNOSIS — F17200 Nicotine dependence, unspecified, uncomplicated: Secondary | ICD-10-CM

## 2018-10-14 DIAGNOSIS — Z23 Encounter for immunization: Secondary | ICD-10-CM

## 2018-10-14 DIAGNOSIS — G4733 Obstructive sleep apnea (adult) (pediatric): Secondary | ICD-10-CM

## 2018-10-14 DIAGNOSIS — M48061 Spinal stenosis, lumbar region without neurogenic claudication: Secondary | ICD-10-CM

## 2018-10-14 LAB — LIPID PANEL
CHOL/HDL RATIO: 5.2 ratio — AB (ref 0.0–5.0)
Cholesterol, Total: 177 mg/dL (ref 100–199)
HDL: 34 mg/dL — AB (ref >39)
LDL Calculated: 107 mg/dL — ABNORMAL HIGH (ref 0–99)
Triglycerides: 179 mg/dL — ABNORMAL HIGH (ref 0–149)
VLDL Cholesterol Cal: 36 mg/dL (ref 5–40)

## 2018-10-14 LAB — HEMOGLOBIN A1C
Est. average glucose Bld gHb Est-mCnc: 114 mg/dL
HEMOGLOBIN A1C: 5.6 % (ref 4.8–5.6)

## 2018-10-14 MED ORDER — ALBUTEROL SULFATE HFA 108 (90 BASE) MCG/ACT IN AERS: 2.0000 | INHALATION_SPRAY | Freq: Four times a day (QID) | RESPIRATORY_TRACT | 2 refills | Status: AC | PRN

## 2018-10-14 MED ORDER — SILDENAFIL CITRATE 100 MG PO TABS: 50.0000 mg | ORAL_TABLET | Freq: Every day | ORAL | 2 refills | Status: AC | PRN

## 2018-10-14 MED ORDER — HYDROCODONE-ACETAMINOPHEN 5-325 MG PO TABS: ORAL_TABLET | ORAL | 0 refills | Status: AC

## 2018-10-14 MED ORDER — MOMETASONE FURO-FORMOTEROL FUM 200-5 MCG/ACT IN AERO: 2.0000 | INHALATION_SPRAY | Freq: Two times a day (BID) | RESPIRATORY_TRACT | 11 refills | Status: AC

## 2018-10-14 MED ORDER — LEVALBUTEROL HCL 0.63 MG/3ML IN NEBU: INHALATION_SOLUTION | RESPIRATORY_TRACT | 2 refills | Status: AC

## 2018-10-14 MED ORDER — TERBINAFINE HCL 1 % EX CREA: 1.0000 "application " | TOPICAL_CREAM | Freq: Two times a day (BID) | CUTANEOUS | 1 refills | Status: AC

## 2018-10-14 MED ORDER — ROSUVASTATIN CALCIUM 10 MG PO TABS: 10.0000 mg | ORAL_TABLET | Freq: Every day | ORAL | 3 refills | Status: AC

## 2018-10-14 MED ORDER — CLARITHROMYCIN 500 MG PO TABS: 500.0000 mg | ORAL_TABLET | Freq: Two times a day (BID) | ORAL | 0 refills | Status: AC

## 2018-10-14 NOTE — Patient Instructions (Addendum)
Recommendations:  General screenings   See your eye doctor yearly for routine vision care.  Make sure they send [place] a copy of the eye exam and diabetic eye exam  See your dentist yearly for routine dental care including hygiene visits twice yearly.  See [place] yearly for physical/medicare well visit  Diabetes  Continue to check your blood sugars fasting in the morning with the goal of less than 130  We will update your labs today  Check your feet daily for wounds or sores that are not healing  See your eye doctor yearly for routine diabetic exam  High cholesterol, aortic atherosclerosis  You had a CT scan of your chest last year showing cholesterol buildup in your aorta  With a diagnosis of diabetes you automatically you are considered to have heart disease  Thus you should stay on a statin medication ongoing to lower your cholesterol and lower your heart disease risk.  Thus please start back on medication, but I will have you use Crestor if insurance will cover this as it works the best  Asthma/COPD  I strongly recommend you find a way to give up cigarette smoking and cigarette/tobacco products altogether  Tobacco use makes it higher probability that you will have a cancer  Tobacco use makes it easier for cholesterol to continue to build up in your arteries  Tobacco use will worsen your lung disease you already have  Lets restart Dulera 2 puffs twice daily as your current lung issue is not controlled  Low testosterone-we will call in again the compounded cream we discussed last visit however this will not be at your typical pharmacy, [facility]  You will likely have to pay out-of-pocket for this  Sinus infection- I will put you on a round of Biaxin for recurrent sinus infection.  You can use 4 to 5 days of Mucinex DM over-the-counter, hydrate well  Athletes feet  Begin Lamisil cream sent to the pharmacy, it may take 2 or more weeks for this to  totally resolve  If you would like we continue to the podiatrist for toenail trimming  Dietary recommendations:  I recommend you eat 3 or more meals daily with mostly vegetables and protein.  I would significantly limit grains, pasta, bread, rice.  Half of your plate should be vegetables for lunch and dinner, as long as they are not white potatoes:  Drink mainly water for your beverage  The 2-3 fruit servings daily  Do not skip breakfast, as this may be slowing your metabolism down which contributes to weight gain  Continue routine follow-up with your psychiatrist, orthopedist, dentist  I recommend you try to exercise every day of the week such as 30 minutes of walking or elliptical machine or stationary bike    Weight Screen/BMI Screen  Wt Readings from Last 3 Encounters:  10/14/18 284 lb 6.4 oz (129 kg)  09/30/18 278 lb (126.1 kg)  09/16/18 282 lb (127.9 kg)    Your height to weight ratio is Body mass index is 36.51 kg/m.  Reference Range:  Underweight: BMI less than 18.5.  Normal weight: BMI between 18.5 and 24.9.  Overweight: BMI between 25 and 29.9.  Obese: BMI of 30 and above.   Your listed Vaccines/Immunizations: Immunization History  Administered Date(s) Administered  . Influenza Split 08/20/2011, 09/16/2012, 09/16/2017  . Influenza,inj,Quad PF,6+ Mos 08/17/2014, 12/20/2015, 10/29/2016, 09/10/2017, 10/14/2018  . Pneumococcal Polysaccharide-23 02/06/2012, 10/29/2016  . Tdap 01/23/2015, 06/15/2015    Fall prevention:  Falls are the leading cause of injuries, accidents, and accidental deaths in people over the age of 52. Falling is a real threat to your ability to live on your own.  Causes include poor eyesight or poor hearing, illness, poor lighting, throw rugs, clutter in your home, and medication side effects causing dizziness or balance problems.  Such medications can include medications for depression, sleep problems, high blood pressure, diabetes, and  heart conditions.   PREVENTION  Be sure your home is as safe as possible. Here are some tips:  Wear shoes with non-skid soles (not house slippers).   Be sure your home and outside area are well lit.   Use night lights throughout your house, including hallways and stairways.   Remove clutter and clean up spills on floors and walkways.   Remove throw rugs or fasten them to the floor with carpet tape. Tack down carpet edges.   Do not place electrical cords across pathways.   Install grab bars in your bathtub, shower, and toilet area. Towel bars should not be used as a grab bar.   Install handrails on both sides of stairways.   Do not climb on stools or stepladders. Get someone else to help with jobs that require climbing.   Do not wax your floors at all, or use a non-skid wax.   Repair uneven or unsafe sidewalks, walkways or stairs.   Keep frequently used items within reach.   Be aware of pets so you do not trip.  Get regular check-ups from your doctor, and take good care of yourself:  Have your eyes checked every year for vision changes, cataracts, glaucoma, and other eye problems. Wear eyeglasses as directed.   Have your hearing checked every 2 years, or anytime you or others think that you cannot hear well. Use hearing aids as directed.   See your caregiver if you have foot pain or corns. Sore feet can contribute to falls.   Let your caregiver know if a medicine is making you feel dizzy or making you lose your balance.   Use a cane, walker, or wheelchair as directed. Use walker or wheelchair brakes when getting in and out.   When you get up from bed, sit on the side of the bed for 1 to 2 minutes before you stand up. This will give your blood pressure time to adjust, and you will feel less dizzy.   If you need to go to the bathroom often, consider using a bedside commode.  Disease prevention:  If you smoke or chew tobacco, find out from your caregiver how to quit. It can  literally save your life, no matter how long you have been a tobacco user. If you do not use tobacco, never begin. Medicare does cover some smoking cessation counseling.  Maintain a healthy diet and normal weight. Increased weight leads to problems with blood pressure and diabetes. We check your height, weight, and BMI as part of your yearly visit.  The Body Mass Index or BMI is a way of measuring how much of your body is fat. Having a BMI above 27 increases the risk of heart disease, diabetes, hypertension, stroke and other problems related to obesity. Your caregiver can help determine your BMI and based on it develop an exercise and dietary program to help you achieve or maintain this important measurement at a healthful level.  High blood pressure causes heart and blood vessel problems.  Persistent high blood pressure should be treated with medicine if weight  loss and exercise do not work.  We check your blood pressure as part of your yearly visit.  Avoid drinking alcohol in excess (more than two drinks per day).  Avoid use of street drugs. Do not share needles with anyone. Ask for professional help if you need assistance or instructions on stopping the use of alcohol, cigarettes, and/or drugs.  Brush your teeth twice a day with fluoride toothpaste, and floss once a day. Good oral hygiene prevents tooth decay and gum disease. The problems can be painful, unattractive, and can cause other health problems. Visit your dentist for a routine oral and dental checkup and preventive care every 6-12 months.   See your eye doctor yearly for routine screening for things like glaucoma.  Look at your skin regularly.  Use a mirror to look at your back. Notify your caregivers of changes in moles, especially if there are changes in shapes, colors, a size larger than a pencil eraser, an irregular border, or development of new moles.  Safety:  Use seatbelts 100% of the time, whether driving or as a passenger.   Use safety devices such as hearing protection if you work in environments with loud noise or significant background noise.  Use safety glasses when doing any work that could send debris in to the eyes.  Use a helmet if you ride a bike or motorcycle.  Use appropriate safety gear for contact sports.  Talk to your caregiver about gun safety.  Use sunscreen with a SPF (or skin protection factor) of 15 or greater.  Lighter skinned people are at a greater risk of skin cancer. Don't forget to also wear sunglasses in order to protect your eyes from too much damaging sunlight. Damaging sunlight can accelerate cataract formation.   If you have multiple sexual partners, or if you are not in a monogamous relationship, practice safe sex. Use condoms. Condoms are used to help reduce the spread of sexually transmitted infections (or STIs).  Consider an HIV test if you have never been tested.  Consider routine screening for STIs if you have multiple sexual partners.   Keep carbon monoxide and smoke detectors in your home functioning at all times. Change the batteries every 6 months or use a model that plugs into the wall or is hard wired in.   END OF LIFE PLANNING/ADVANCED DIRECTIVES Advance health-care planning is deciding the kind of care you want at the end of life. While alert competent adults are able to exercise their rights to make health care and financial decisions, problems arise when an individual becomes unconscious, incapacitated, or otherwise unable to communicate or make such decisions. Advance health care directives are the legal documents in which you give written instructions about your choices limited, aggressive or palliative care if, in the future, you cannot speak for yourself.  Advanced directives include the following: [place] allows you to appoint someone to act as your health care agent to make health care decisions for you should it be determined by your health care  provider that you are no longer able to make these decisions for yourself.  A Living Will is a legal document in which you can declare that under certain conditions you desire your life not be prolonged by extraordinary or artificial means during your last illness or when you are near death. We can provide you with sample advanced directives, you can get an attorney to prepare these for you, or you can visit [place] Secretary of State's  website for additional information and resources at [email or web address]  Further, I recommend you have an attorney prepare a Will and Durable Power of Attorney if you haven't done so already.  Please get [place] a copy of your health care Advanced Directives.     Cancer Screening: I recommend a monthly self testicular exam  Osteoporosis Screening: Screening for osteoporosis usually begins at age 17 for women, and can be done as frequent as every 2 years.  However, women or men with higher risk of osteoporosis may be screened earlier than age 87.  Osteoporosis or low bone mass is diminished bone strength from alterations in bone architecture leading to bone fragility and increased fracture risk.
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
## 2018-10-15 ENCOUNTER — Encounter (HOSPITAL_COMMUNITY): Payer: Medicare Other

## 2018-10-17 ENCOUNTER — Telehealth: Payer: Self-pay | Admitting: Medical

## 2018-10-17 ENCOUNTER — Encounter (HOSPITAL_COMMUNITY): Payer: Medicare Other

## 2018-10-17 NOTE — Telephone Encounter (Signed)
recv'd another fax for P.A. Sildenafil but this has already been denied for plan exclusion.  Searched [facility] and pt can get at [facility] for $10.65 will call pt again and give this option

## 2018-10-22 ENCOUNTER — Ambulatory Visit: Payer: Medicare Other | Admitting: Orthopaedic Surgery

## 2018-10-22 ENCOUNTER — Encounter (HOSPITAL_COMMUNITY): Payer: Medicare Other

## 2018-10-24 ENCOUNTER — Encounter (HOSPITAL_COMMUNITY): Payer: Medicare Other

## 2018-10-27 ENCOUNTER — Encounter (HOSPITAL_COMMUNITY): Payer: Medicare Other

## 2018-10-29 ENCOUNTER — Encounter (HOSPITAL_COMMUNITY): Payer: Medicare Other

## 2018-11-19 NOTE — Telephone Encounter (Signed)
Left another message for pt.

## 2018-11-28 DIAGNOSIS — F900 Attention-deficit hyperactivity disorder, predominantly inattentive type: Secondary | ICD-10-CM | POA: Diagnosis not present

## 2018-11-28 DIAGNOSIS — Z79899 Other long term (current) drug therapy: Secondary | ICD-10-CM | POA: Diagnosis not present

## 2018-12-09 ENCOUNTER — Encounter (HOSPITAL_COMMUNITY): Payer: Self-pay

## 2018-12-09 NOTE — Therapy (Signed)
[facility] [place], [place], [address] Phone: [phone]   Fax:  [phone]  Patient Details  Name: Eric Hayes MRN: [id number] Date of Birth: 24-Jul-1976 Referring Provider:  No ref. provider found  Encounter Date: 12/09/2018 OCCUPATIONAL THERAPY DISCHARGE SUMMARY  Visits from Start of Care: 1  Current functional level related to goals / functional outcomes: Pt called to cancel all appointments stating that he does not have transportation at this time to make his appointments. Wished to return when he had his vehicle fixed. Patient has not returned since October 2019 and will be discharged. All deficits remain from OT evaluation.       Education / Equipment: See evaluation note. Plan: Patient agrees to discharge.  Patient goals were not met. Patient is being discharged due to not returning since the last visit.  ?????          Ailene Ravel, OTR/L,CBIS  [phone]  12/09/2018, 9:37 AM  [facility] [address] [place], [place], [address] Phone: [phone]   Fax:  [phone]

## 2019-01-13 ENCOUNTER — Telehealth: Payer: Self-pay | Admitting: Medical

## 2019-01-13 NOTE — Telephone Encounter (Signed)
Pt wife returned call. Pt wife informed that Dr.Steve is not taking on any adults of established patients at this time. Pt wife verbalized understanding

## 2019-01-13 NOTE — Telephone Encounter (Signed)
Sorry we are no longer taking aduklts of established children, there was a time we did but no longer, we currently do not have time in the day to care for our current patients

## 2019-01-13 NOTE — Telephone Encounter (Signed)
Left message to return call 

## 2019-01-13 NOTE — Telephone Encounter (Signed)
Wife calling on behalf of Mr.Barbra Sarks, Mrs.Royer and Mr.Netto states their daughter is current patient of Dr.Steves Mrs.Delduca states Dr.Steve informed them that the members of their daughters family could call and become a new patient of Dr.Steves, Mrs.Enslin would like to get Mr.Vandyke an appt in regards of having him placed back on his ADHD medication, Mr.Minardi is currently seeing [name] PA for his medical needs, Mrs.Klippel States that Mr.Tysinger PA only prescribes Mr.Woodsons basic medications such as cholesterol, and blood pressure, and not his ADHD medication. Advise.

## 2019-01-13 NOTE — Telephone Encounter (Signed)
Please advise. Thank you

## 2019-01-14 ENCOUNTER — Telehealth: Payer: Self-pay | Admitting: Medical

## 2019-01-14 NOTE — Telephone Encounter (Signed)
Pt called and states that his Psych doctor is no longer able to write rx's. He states he lost his privileges. Pt was inform by him to call PCP for refills. Pt is requesting refills of adderall. Pt can be reached at [phone]. pt uses [facility] in [place].

## 2019-01-15 NOTE — Telephone Encounter (Signed)
I am placing the controlled substance report on your desk.  Dr. Bernita Raisin has his license indefinitely suspended Sept 2017 and the voluntarily surrendered his Controlled Substance privileges on 12/15/2018.    Let's talk about documentation.

## 2019-01-15 NOTE — Telephone Encounter (Signed)
Beverlee Nims - please check into Dr. Junie Bame.   This note would suggest Dr. Junie Bame had an issue.  Can you find out if Dr. Junie Bame lost privileges.  I don't want to take over care for his controlled substances, but may help short term til he gets new psychiatrist.   Can you pull [place] drug report on him too

## 2019-01-15 NOTE — Telephone Encounter (Signed)
Pt called again about this same issue °

## 2019-01-16 NOTE — Telephone Encounter (Signed)
I am reviewing records before making a decision.   In the meantime, he needs to establish with a different psychiatrist.

## 2019-01-16 NOTE — Telephone Encounter (Signed)
Left message on voicemail for patient to call back. 

## 2019-01-16 NOTE — Telephone Encounter (Signed)
Patient notified by Delfino Lovett

## 2019-01-22 ENCOUNTER — Telehealth: Payer: Self-pay | Admitting: Medical

## 2019-01-22 NOTE — Telephone Encounter (Signed)
Mickel Baas could you please call Mr. Barbra Sarks Milta Deiters) if you feel comfortable with this.  If not, defer it back to [place] or [place].   I just wanted to get him an answer ASAP.  (he had asked me to prescribe his adderall)  I looked over his chart, looked over his medications that were being prescribed by Dr. Junie Bame.  He does need to establish with a psychiatrist as soon as possible.  I would recommend trying [facility] in [place] or [facility] in [place] if that is more convenient for him.  I was surprised to see that he has been routinely prescribed high-dose Xanax 120/month along with all the other medications he is prescribed.  I hope that he can find a way to get his symptoms under better control with less medication as those are quite high doses of medication.  It would not be appropriate for me to take over prescribing his Adderall or alprazolam.  I would recommend he call today to try to get appointment with a psychiatrist as soon as possible, if he has not done this already.  Worse case scenario, if he cannot get an appointment within a short-term time frame with psychiatry, then the next best thing would be to go to [facility] as they will see patients on a walk-in basis, and he could probably get medication refills that day   FYI-we reviewed [employer] and his previous psychiatrist Dr. Junie Bame has indefinitely lost their controlled substance prescribing ability due to numerous problems in the practice

## 2019-01-23 NOTE — Telephone Encounter (Signed)
Left message for pt

## 2019-02-06 NOTE — Telephone Encounter (Signed)
Called and spoke with pt, and explained Shane's message, he has not yet gotten in with psychiatry.  He will schedule an appt.  Gave him the suggestions

## 2019-02-20 DIAGNOSIS — F410 Panic disorder [episodic paroxysmal anxiety] without agoraphobia: Secondary | ICD-10-CM | POA: Diagnosis not present

## 2019-03-04 NOTE — Telephone Encounter (Signed)
error 

## 2019-05-18 DIAGNOSIS — F250 Schizoaffective disorder, bipolar type: Secondary | ICD-10-CM | POA: Diagnosis not present

## 2019-05-18 DIAGNOSIS — Z79899 Other long term (current) drug therapy: Secondary | ICD-10-CM | POA: Diagnosis not present

## 2019-06-03 DIAGNOSIS — I499 Cardiac arrhythmia, unspecified: Secondary | ICD-10-CM | POA: Diagnosis not present

## 2019-06-03 DIAGNOSIS — R404 Transient alteration of awareness: Secondary | ICD-10-CM | POA: Diagnosis not present

## 2019-06-03 DIAGNOSIS — R4020 Unspecified coma: Secondary | ICD-10-CM | POA: Diagnosis not present

## 2019-06-03 DIAGNOSIS — E1165 Type 2 diabetes mellitus with hyperglycemia: Secondary | ICD-10-CM | POA: Diagnosis not present

## 2019-06-04 ENCOUNTER — Telehealth: Payer: Self-pay | Admitting: Medical

## 2019-06-04 NOTE — Telephone Encounter (Signed)
I received a call 17-Jun-2019 Wednesday morning from a paramedic arrived at the scene Eric Hayes's home.  Eric Hayes had apparently witnessed him collapse.  The paramedics had found him face down in a pool of blood coming out of his mouth.  CPR had been performed for about 30 minutes.  The whole time he has been asystole.  Paramedics were asking me to list a cause of death.  They wanted some details about his health.  They were suspecting heart attack or sudden heart arrest.  Given his age and given the fact that I advised that his psychiatrist has stopped prescribing his controlled substances in recent months given his license revocation I have also concerns about whether this was an overdose or GI bleed from overdose of NSAIDs or other cause of death.  I did not have enough information to make that decision.  At that point paramedics eventually took his body to [facility].  They were working with Arnoldo Morale, of [facility] medical examiner's office to coordinate with the state medical examiner's office in [facility].  They called me and we coordinated back-and-forth over the course of 2019-06-17.   By the end of the day on July 1 it was determined that there was a good chance he probably did die of a heart attack given his underlying risk factors for heart disease.  He did not have any recent signs of COVID-19 based on my understanding from the paramedics and what the medical examiner's had ascertain from the scene.  I personally called and spoke to Eric Hayes from the state [employer].  This was a witnessed collapse at home.  As of late evening on July 1 I did agree to sign off on his death certificate.  As of 2:50 PM on July 2 I have not received his death certificate.  I have been waiting all day on July 2 to receive paperwork from either the funeral home or the medical examiner's office.  We have called to let them know I was leaving by  2 PM today.  At this point I will hopefully get the paperwork Monday of next week as this is a holiday weekend and I have made it clear to the medical examiner's office I will be leaving 2 PM today and would not be back until Monday of next week.

## 2019-06-04 NOTE — Telephone Encounter (Signed)
I received a call Wednesday morning.

## 2019-06-08 NOTE — Telephone Encounter (Signed)
I signed death certificate Thursday afternoon, July 2nd.    Please send the family a sympathy card.  I'd like to sign it, unless it has already been sent.

## 2019-06-09 NOTE — Telephone Encounter (Signed)
Card coming to you for signature

## 2019-06-12 NOTE — Telephone Encounter (Signed)
Please mail card

## 2019-07-04 DIAGNOSIS — 419620001 Death: Secondary | SNOMED CT | POA: Diagnosis not present

## 2019-07-04 DEATH — deceased

## 2019-07-28 ENCOUNTER — Telehealth: Payer: Self-pay | Admitting: Medical

## 2019-07-28 NOTE — Telephone Encounter (Signed)
Please scan copy of death certificate for our records, and return .    He isn't marked in chart as deceased.  This needs to be done.

## 2019-07-28 NOTE — Telephone Encounter (Signed)
Made a copy for [place] and mailed copy to them forwarding to [place] to mark in chart as deceased
